# Patient Record
Sex: Male | Born: 1940 | Race: White | Hispanic: No | Marital: Married | Smoking: Former smoker
Health system: Southern US, Community
[De-identification: ages and names within clinical notes are randomized; demographics above are authoritative.]

## PROBLEM LIST (undated history)

## (undated) DIAGNOSIS — Z9889 Other specified postprocedural states: Secondary | ICD-10-CM

## (undated) DIAGNOSIS — T4145XA Adverse effect of unspecified anesthetic, initial encounter: Secondary | ICD-10-CM

## (undated) DIAGNOSIS — K219 Gastro-esophageal reflux disease without esophagitis: Secondary | ICD-10-CM

## (undated) DIAGNOSIS — I10 Essential (primary) hypertension: Secondary | ICD-10-CM

## (undated) DIAGNOSIS — E785 Hyperlipidemia, unspecified: Secondary | ICD-10-CM

## (undated) DIAGNOSIS — C801 Malignant (primary) neoplasm, unspecified: Secondary | ICD-10-CM

## (undated) DIAGNOSIS — IMO0001 Reserved for inherently not codable concepts without codable children: Secondary | ICD-10-CM

## (undated) DIAGNOSIS — R112 Nausea with vomiting, unspecified: Secondary | ICD-10-CM

## (undated) DIAGNOSIS — Z8719 Personal history of other diseases of the digestive system: Secondary | ICD-10-CM

## (undated) DIAGNOSIS — I739 Peripheral vascular disease, unspecified: Secondary | ICD-10-CM

## (undated) DIAGNOSIS — N289 Disorder of kidney and ureter, unspecified: Secondary | ICD-10-CM

## (undated) DIAGNOSIS — J449 Chronic obstructive pulmonary disease, unspecified: Secondary | ICD-10-CM

## (undated) DIAGNOSIS — F17200 Nicotine dependence, unspecified, uncomplicated: Secondary | ICD-10-CM

## (undated) DIAGNOSIS — M1990 Unspecified osteoarthritis, unspecified site: Secondary | ICD-10-CM

## (undated) DIAGNOSIS — K5730 Diverticulosis of large intestine without perforation or abscess without bleeding: Secondary | ICD-10-CM

## (undated) HISTORY — DX: Hyperlipidemia, unspecified: E78.5

## (undated) HISTORY — DX: Diverticulosis of large intestine without perforation or abscess without bleeding: K57.30

## (undated) HISTORY — DX: Reserved for inherently not codable concepts without codable children: IMO0001

## (undated) HISTORY — DX: Essential (primary) hypertension: I10

## (undated) HISTORY — DX: Unspecified osteoarthritis, unspecified site: M19.90

## (undated) HISTORY — PX: WISDOM TOOTH EXTRACTION: SHX21

## (undated) HISTORY — DX: Nicotine dependence, unspecified, uncomplicated: F17.200

## (undated) HISTORY — PX: COLONOSCOPY: SHX174

## (undated) HISTORY — DX: Personal history of other diseases of the digestive system: Z87.19

## (undated) HISTORY — DX: Disorder of kidney and ureter, unspecified: N28.9

## (undated) SURGERY — Surgical Case
Anesthesia: *Unknown

---
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
## 1961-10-31 DIAGNOSIS — T8859XA Other complications of anesthesia, initial encounter: Secondary | ICD-10-CM

## 1961-10-31 HISTORY — DX: Other complications of anesthesia, initial encounter: T88.59XA

## 1989-10-31 HISTORY — PX: CYSTOSCOPY: SUR368

## 2003-04-01 ENCOUNTER — Encounter: Payer: Self-pay | Admitting: Family Medicine

## 2004-04-30 ENCOUNTER — Encounter: Payer: Self-pay | Admitting: Family Medicine

## 2004-11-03 ENCOUNTER — Ambulatory Visit: Payer: Self-pay | Admitting: Family Medicine

## 2005-04-29 ENCOUNTER — Ambulatory Visit: Payer: Self-pay | Admitting: Family Medicine

## 2005-04-30 ENCOUNTER — Encounter: Payer: Self-pay | Admitting: Family Medicine

## 2005-05-05 ENCOUNTER — Ambulatory Visit: Payer: Self-pay | Admitting: Family Medicine

## 2005-11-08 ENCOUNTER — Ambulatory Visit: Payer: Self-pay | Admitting: Family Medicine

## 2006-01-30 ENCOUNTER — Ambulatory Visit: Payer: Self-pay | Admitting: Gastroenterology

## 2006-04-30 ENCOUNTER — Encounter: Payer: Self-pay | Admitting: Family Medicine

## 2006-05-05 ENCOUNTER — Ambulatory Visit: Payer: Self-pay | Admitting: Family Medicine

## 2006-05-09 ENCOUNTER — Ambulatory Visit: Payer: Self-pay | Admitting: Family Medicine

## 2006-11-10 ENCOUNTER — Ambulatory Visit: Payer: Self-pay | Admitting: Family Medicine

## 2007-05-10 ENCOUNTER — Encounter: Payer: Self-pay | Admitting: Family Medicine

## 2007-05-10 DIAGNOSIS — M1990 Unspecified osteoarthritis, unspecified site: Secondary | ICD-10-CM | POA: Insufficient documentation

## 2007-05-10 DIAGNOSIS — E785 Hyperlipidemia, unspecified: Secondary | ICD-10-CM

## 2007-05-10 DIAGNOSIS — K5730 Diverticulosis of large intestine without perforation or abscess without bleeding: Secondary | ICD-10-CM | POA: Insufficient documentation

## 2007-05-10 DIAGNOSIS — D518 Other vitamin B12 deficiency anemias: Secondary | ICD-10-CM | POA: Insufficient documentation

## 2007-05-11 ENCOUNTER — Ambulatory Visit: Payer: Self-pay | Admitting: Family Medicine

## 2007-05-11 DIAGNOSIS — F17200 Nicotine dependence, unspecified, uncomplicated: Secondary | ICD-10-CM

## 2007-05-11 DIAGNOSIS — I10 Essential (primary) hypertension: Secondary | ICD-10-CM | POA: Insufficient documentation

## 2007-05-11 DIAGNOSIS — N259 Disorder resulting from impaired renal tubular function, unspecified: Secondary | ICD-10-CM | POA: Insufficient documentation

## 2007-05-11 LAB — CONVERTED CEMR LAB
Albumin: 4.1 g/dL (ref 3.5–5.2)
Bilirubin, Direct: 0.1 mg/dL (ref 0.0–0.3)
Cholesterol: 190 mg/dL (ref 0–200)
GFR calc non Af Amer: 71 mL/min
Glucose, Bld: 93 mg/dL (ref 70–99)
HDL: 56.5 mg/dL (ref >39.0)
LDL Cholesterol: 116 mg/dL — ABNORMAL HIGH (ref 0–99)
PSA: 1.3 ng/mL (ref 0.10–4.00)
Potassium: 3.9 meq/L (ref 3.5–5.1)
Sodium: 139 meq/L (ref 135–145)
TSH: 3.65 microintl units/mL (ref 0.35–5.50)
Total Bilirubin: 0.9 mg/dL (ref 0.3–1.2)
Triglycerides: 90 mg/dL (ref 0–149)
VLDL: 18 mg/dL (ref 0–40)

## 2007-05-15 ENCOUNTER — Ambulatory Visit: Payer: Self-pay | Admitting: Family Medicine

## 2007-05-25 ENCOUNTER — Ambulatory Visit: Payer: Self-pay | Admitting: Family Medicine

## 2007-06-01 ENCOUNTER — Telehealth (INDEPENDENT_AMBULATORY_CARE_PROVIDER_SITE_OTHER): Payer: Self-pay | Admitting: *Deleted

## 2007-06-22 ENCOUNTER — Ambulatory Visit: Payer: Self-pay | Admitting: Family Medicine

## 2008-02-16 ENCOUNTER — Emergency Department: Payer: Self-pay | Admitting: Internal Medicine

## 2008-02-29 ENCOUNTER — Ambulatory Visit: Payer: Self-pay | Admitting: Family Medicine

## 2008-04-10 ENCOUNTER — Ambulatory Visit: Payer: Self-pay | Admitting: Family Medicine

## 2008-07-10 ENCOUNTER — Encounter (INDEPENDENT_AMBULATORY_CARE_PROVIDER_SITE_OTHER): Payer: Self-pay | Admitting: *Deleted

## 2008-07-31 ENCOUNTER — Ambulatory Visit: Payer: Self-pay | Admitting: Family Medicine

## 2008-07-31 DIAGNOSIS — N009 Acute nephritic syndrome with unspecified morphologic changes: Secondary | ICD-10-CM | POA: Insufficient documentation

## 2008-07-31 LAB — CONVERTED CEMR LAB
Albumin: 4 g/dL (ref 3.5–5.2)
Alkaline Phosphatase: 85 units/L (ref 39–117)
BUN: 17 mg/dL (ref 6–23)
Basophils Relative: 0.7 % (ref 0.0–3.0)
Calcium: 8.7 mg/dL (ref 8.4–10.5)
Creatinine, Ser: 1.2 mg/dL (ref 0.4–1.5)
Eosinophils Absolute: 0.2 10*3/uL (ref 0.0–0.7)
Eosinophils Relative: 3.6 % (ref 0.0–5.0)
GFR calc Af Amer: 78 mL/min
GFR calc non Af Amer: 64 mL/min
Glucose, Bld: 89 mg/dL (ref 70–99)
HCT: 46.9 % (ref 39.0–52.0)
HDL: 52 mg/dL (ref >39.0)
Hemoglobin: 16.7 g/dL (ref 13.0–17.0)
MCV: 103.6 fL — ABNORMAL HIGH (ref 78.0–100.0)
Monocytes Absolute: 0.6 10*3/uL (ref 0.1–1.0)
Monocytes Relative: 10 % (ref 3.0–12.0)
Neutro Abs: 3.2 10*3/uL (ref 1.4–7.7)
PSA: 0.88 ng/mL (ref 0.10–4.00)
Platelets: 195 10*3/uL (ref 150–400)
Potassium: 4 meq/L (ref 3.5–5.1)
Total CHOL/HDL Ratio: 3.1
Total Protein: 7.2 g/dL (ref 6.0–8.3)
Triglycerides: 81 mg/dL (ref 0–149)
WBC: 5.5 10*3/uL (ref 4.5–10.5)

## 2008-08-07 ENCOUNTER — Ambulatory Visit: Payer: Self-pay | Admitting: Family Medicine

## 2008-08-27 ENCOUNTER — Encounter (INDEPENDENT_AMBULATORY_CARE_PROVIDER_SITE_OTHER): Payer: Self-pay | Admitting: *Deleted

## 2008-08-27 ENCOUNTER — Ambulatory Visit: Payer: Self-pay | Admitting: Family Medicine

## 2008-08-27 LAB — CONVERTED CEMR LAB
OCCULT 1: NEGATIVE
OCCULT 2: NEGATIVE
OCCULT 3: NEGATIVE

## 2009-05-28 ENCOUNTER — Ambulatory Visit: Payer: Self-pay | Admitting: Family Medicine

## 2009-05-28 DIAGNOSIS — M25519 Pain in unspecified shoulder: Secondary | ICD-10-CM | POA: Insufficient documentation

## 2009-05-28 DIAGNOSIS — M19049 Primary osteoarthritis, unspecified hand: Secondary | ICD-10-CM | POA: Insufficient documentation

## 2009-06-19 ENCOUNTER — Encounter: Payer: Self-pay | Admitting: Family Medicine

## 2009-07-08 ENCOUNTER — Ambulatory Visit: Payer: Self-pay | Admitting: Family Medicine

## 2009-10-28 ENCOUNTER — Encounter: Payer: Self-pay | Admitting: Family Medicine

## 2010-02-11 ENCOUNTER — Ambulatory Visit: Payer: Self-pay | Admitting: Family Medicine

## 2010-02-12 LAB — CONVERTED CEMR LAB
ALT: 28 units/L (ref 0–53)
AST: 18 units/L (ref 0–37)
Basophils Relative: 0.7 % (ref 0.0–3.0)
Bilirubin, Direct: 0.1 mg/dL (ref 0.0–0.3)
CO2: 29 meq/L (ref 19–32)
Calcium: 9.1 mg/dL (ref 8.4–10.5)
Creatinine, Ser: 1.1 mg/dL (ref 0.4–1.5)
Eosinophils Relative: 4.2 % (ref 0.0–5.0)
GFR calc non Af Amer: 70.76 mL/min (ref >60)
HCT: 47.9 % (ref 39.0–52.0)
HDL: 57.7 mg/dL (ref >39.00)
Lymphs Abs: 1.8 10*3/uL (ref 0.7–4.0)
Monocytes Relative: 11.1 % (ref 3.0–12.0)
Neutrophils Relative %: 55.1 % (ref 43.0–77.0)
Platelets: 202 10*3/uL (ref 150.0–400.0)
Potassium: 3.8 meq/L (ref 3.5–5.1)
RBC: 4.64 M/uL (ref 4.22–5.81)
Sodium: 141 meq/L (ref 135–145)
Total Bilirubin: 0.5 mg/dL (ref 0.3–1.2)
Total CHOL/HDL Ratio: 3
Total Protein: 7.8 g/dL (ref 6.0–8.3)
WBC: 6.2 10*3/uL (ref 4.5–10.5)

## 2010-02-17 ENCOUNTER — Ambulatory Visit: Payer: Self-pay | Admitting: Family Medicine

## 2010-06-07 ENCOUNTER — Encounter (INDEPENDENT_AMBULATORY_CARE_PROVIDER_SITE_OTHER): Payer: Self-pay | Admitting: *Deleted

## 2010-07-23 ENCOUNTER — Ambulatory Visit: Payer: Self-pay | Admitting: Family Medicine

## 2010-07-23 DIAGNOSIS — R05 Cough: Secondary | ICD-10-CM

## 2010-08-18 ENCOUNTER — Encounter: Payer: Self-pay | Admitting: Family Medicine

## 2010-08-25 ENCOUNTER — Ambulatory Visit: Payer: Self-pay | Admitting: Family Medicine

## 2010-11-30 NOTE — Assessment & Plan Note (Signed)
Summary: CHECK UP/CLE   Vital Signs:  Patient profile:   70 year old male Weight:      178.50 pounds Temp:     98.6 degrees F oral Pulse rate:   64 / minute Pulse rhythm:   regular BP sitting:   122 / 68  (left arm) Cuff size:   regular  Vitals Entered By: Sydell Axon LPN (February 17, 2010 1:53 PM) CC: 30 Minute checkup, had a colonoscopy at [facility] in 2008/03/13   History of Present Illness: Pt here for followup. He feels well and is doiung great. He has had an extremely healthy year. He hauled 3 tons of dirt and 3 tons of sand for an above ground pool which is noit up right now.   He has changed ETOH intake and eating habits. He has now arranged his diet for big breakfast, medium lunch and small dinner.   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 1 Tablet Daily By Mouth 2)  Quinapril Hcl 40 Mg  Tabs  (Quinapril Hcl) .Marland Kitchen.. 1 By Mouth Once Daily 3)  Amlodipine Besylate 10 Mg  Tabs (Amlodipine Besylate) .... One Tab By Mouth Hs  Allergies: 1)  ! Penicillin V Potassium (Penicillin V Potassium) 2)  ! Cefzil 3)  ! 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        Cystoscopy due to prolonged prostatitis 01/18/03    Colonoscopy, polyps, diverticular bleed     Recheck 06 3/20 - 01/29/03    [facility], diverticular bleed 01/30/06      Colonoscopy - polyps, biopsy (-) ([place])  3 yrs  Family History: Last updated: May 11, 2007 Father: Died 85, 11 months 03-13-1973) Subdural hematoma Mother: Died 19 Mar 14, 1983) Chronic asthma, MI, thrombophlebitis, H.H., asthma attack caused MI Siblings: None CV:  (+) Mother MI HBP:  (+) self Stroke:  (-) Right facial paralysis S/P Electric shock  Social History: Last updated: May 11, 2007 Current Smoker, off and on for 40 years - 2 PPD at first, now 1 PPD Alcohol use-yes, with dinner Drug use-no Marital Status:  Widower 09/04/23, wife suddenly died.  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  Now Rhetta Mura part-time, Theatre manager, works a lot, Chief Executive Officer  Risk Factors: Alcohol Use: 2 (02/17/2010) Caffeine Use: 5+ (02/17/2010) Exercise: yes (02/17/2010)  Risk Factors: Smoking Status: current (02/17/2010) Packs/Day: 1 (02/17/2010) Cans of tobacco/wk: no (02/17/2010) Passive Smoke Exposure: no (02/17/2010)  Review of Systems General:  Denies chills, fatigue, fever, sweats, weakness, and weight loss. Eyes:  Denies blurring, discharge, eye pain, and  itching. ENT:  Denies decreased hearing, ear discharge, and earache. CV:  Denies chest pain or discomfort, fainting, fatigue, palpitations, shortness of breath with exertion, swelling of feet, and swelling of hands. Resp:  Denies cough, shortness of breath, and wheezing. GI:  Denies abdominal pain, bloody stools, change in bowel habits, constipation, dark tarry stools, diarrhea, indigestion, loss of appetite, nausea, vomiting, vomiting blood, and yellowish skin color. GU:  Denies discharge, dysuria, nocturia, and urinary frequency. MS:  Complains of joint pain and stiffness; denies low back pain, muscle aches, and cramps; left shoulder pain, mild stiffness with arthritis.. Derm:  Denies dryness, itching, and rash. Neuro:  Denies numbness, poor balance, tingling, and tremors.  Physical Exam  General:  Well-developed,well-nourished,in no acute distress; alert,appropriate and cooperative throughout examination Head:  Normocephalic and atraumatic without obvious abnormalities. Sinuses NT. Eyes:  Conjunctiva clear bilaterally.  Ears:  External ear exam shows no significant lesions or deformities.  Otoscopic examination reveals clear canals, tympanic membranes are intact bilaterally without bulging, retraction, inflammation or discharge. Hearing is grossly normal bilaterally. Nose:  External nasal examination shows no deformity or inflammation. Nasal mucosa are pink and moist without lesions or exudates. Mouth:  Oral mucosa and oropharynx without lesions or exudates.  Teeth in good repair. Neck:  No deformities, masses, or tenderness noted. Chest Wall:  No deformities, masses, tenderness or gynecomastia noted. Breasts:  No masses or gynecomastia noted Lungs:  Normal respiratory effort, chest expands symmetrically. Lungs are clear to auscultation, no crackles or wheezes. Heart:  Normal rate and regular rhythm. S1 and S2 normal without gallop, murmur, click, rub or other extra sounds. Abdomen:  Bowel  sounds positive,abdomen soft and non-tender without masses, organomegaly or hernias noted. Rectal:  No external abnormalities noted. Normal sphincter tone. No rectal masses or tenderness. G neg. Genitalia:  Testes bilaterally descended without nodularity, tenderness or masses. No scrotal masses or lesions. No penis lesions or urethral discharge. Prostate:  Prostate gland firm and smooth, no enlargement, nodularity, tenderness, mass, asymmetry or induration. 20gms. Msk:  No deformity or scoliosis noted of thoracic or lumbar spine.  Hands slightly decreased ROM. Left Shoulder mildly decreased also. Pulses:  R and L carotid,radial,femoral,dorsalis pedis and posterior tibial pulses are full and equal bilaterally Extremities:  No clubbing, cyanosis, edema, or deformity noted with normal full range of motion of all joints except fingers and left shoulder..   Neurologic:  No cranial nerve deficits noted. Station and gait are normal. Plantar reflexes are down-going bilaterally. DTRs are symmetrical throughout. Sensory, motor and coordinative functions appear intact. Skin:  Intact without suspicious lesions or rashes except sun damage on arms and head. Cervical Nodes:  No lymphadenopathy noted Inguinal Nodes:  No significant adenopathy Psych:  Cognition and judgment appear intact. Alert and cooperative with normal attention span and concentration. No apparent delusions, illusions, hallucinations   Impression & Recommendations:  Problem # 1:  ARTHRITIS, CARPOMETACARPAL JOINT, BILATERAL (ICD-716.94) Assessment Improved Still a slight bother.  Problem # 2:  SHOULDER PAIN, LEFT (  ZOX-096.04) Assessment: Improved Still a slight bother.  Problem # 3:  SCREENING FOR MALIGNANT NEOPLASM, PROSTATE (ICD-V76.44) Assessment: Unchanged Stable exam and PSA.  Problem # 4:  ANEMIA, VITAMIN B12 DEFICIENCY (ICD-281.1) Assessment: Improved  Stable.  Hgb: 16.9 (02/11/2010)   Hct: 47.9 (02/11/2010)   Platelets:  202.0 (02/11/2010) RBC: 4.64 (02/11/2010)   RDW: 13.3 (02/11/2010)   WBC: 6.2 (02/11/2010) MCV: 103.3 (02/11/2010)   MCHC: 35.3 (02/11/2010) Iron: 138 (02/11/2010)   B12: 382 (02/11/2010)   TSH: 6.19 (02/11/2010)  Problem # 5:  RENAL INSUFFICIENCY (ICD-588.9) Assessment: Improved Normal.  Problem # 6:  HYPERTENSION (ICD-401.9) Assessment: Unchanged Well controlled. His updated medication list for this problem includes:    Bisoprolol-hydrochlorothiazide 5-6.25 Mg Tabs (Bisoprolol-hydrochlorothiazide) .Marland Kitchen... 1 tablet daily by mouth    Quinapril Hcl 40 Mg Tabs (Quinapril hcl) .Marland Kitchen... 1 by mouth once daily    Amlodipine Besylate 10 Mg Tabs (Amlodipine besylate) ..... One tab by mouth at bedtime  BP today: 122/68 Prior BP: 120/70 (07/08/2009)  Labs Reviewed: K+: 3.8 (02/11/2010) Creat: : 1.1 (02/11/2010)   Chol: 167 (02/11/2010)   HDL: 57.70 (02/11/2010)   LDL: 89 (02/11/2010)   TG: 103.0 (02/11/2010)  Problem # 7:  HYPERLIPIDEMIA (ICD-272.4) Assessment: Improved Well controlled. Labs Reviewed: SGOT: 18 (02/11/2010)   SGPT: 28 (02/11/2010)   HDL:57.70 (02/11/2010), 52.0 (07/31/2008)  LDL:89 (02/11/2010), 94 (07/31/2008)  Chol:167 (02/11/2010), 162 (07/31/2008)  Trig:103.0 (02/11/2010), 81 (07/31/2008)  Problem # 8:  DIVERTICULOSIS, COLON (ICD-562.10) Assessment: Unchanged  Discussed being seen for prolonged LLQ discomfort.  Labs Reviewed: Hgb: 16.9 (02/11/2010)   Hct: 47.9 (02/11/2010)   WBC: 6.2 (02/11/2010)  Complete Medication List: 1)  Bisoprolol-hydrochlorothiazide 5-6.25 Mg Tabs (Bisoprolol-hydrochlorothiazide) .Marland Kitchen.. 1 tablet daily by mouth 2)  Quinapril Hcl 40 Mg Tabs (Quinapril hcl) .Marland Kitchen.. 1 by mouth once daily 3)  Amlodipine Besylate 10 Mg Tabs (Amlodipine besylate) .... One tab by mouth at bedtime  Patient Instructions: 1)  RTC as needed. Prescriptions: AMLODIPINE BESYLATE 10 MG  TABS (AMLODIPINE BESYLATE) one tab by mouth at bedtime  #90 x 3   Entered and Authorized  by:   Shaune Leeks MD   Signed by:   Shaune Leeks MD on 02/17/2010   Method used:   [facility]        [id number]       [address]       [place], [place]  [address]       Ph: [phone]       Fax: [phone]   RxID:   [id number] QUINAPRIL HCL 40 MG  TABS (QUINAPRIL HCL) 1 by mouth once daily  #90 Tablet x 3   Entered and Authorized by:   Shaune Leeks MD   Signed by:   Shaune Leeks MD on 02/17/2010   Method used:   Electronically to        [id number]       [address]       [place], [place]  [address]       Ph: [phone]       Fax: [phone]   RxID:   [id number] BISOPROLOL-HYDROCHLOROTHIAZIDE 5-6.25 MG TABS (BISOPROLOL-HYDROCHLOROTHIAZIDE) 1 tablet daily by mouth  #90 Tablet x 3   Entered and Authorized by:   Shaune Leeks MD   Signed by:   Shaune Leeks MD on 02/17/2010   Method used:   [facility]  [address] [id number]       [address]       [place], [place]  [address]       Ph: [phone]       Fax: [phone]   RxID:   [id number]   Current Allergies (reviewed today): ! PENICILLIN V POTASSIUM (PENICILLIN V POTASSIUM) ! CEFZIL ! KLOR-CON 10 (POTASSIUM CHLORIDE)  Appended Document: CHECK UP/CLE   Tetanus/Td Vaccine    Vaccine Type: Td    Site: left deltoid    Mfr: Sanofi Pasteur    Dose: 0.5 ml    Route: IM    Given by: Sydell Axon LPN    Exp. Date: 09/15/2011    Lot #: [id number]    VIS given: 09/18/07 version given February 17, 2010.  Pneumovax Vaccine    Vaccine Type: Pneumovax (Medicare)    Site: right deltoid    Mfr: Merck    Dose: 0.5 ml    Route: IM    Given by: Sydell Axon LPN    Exp. Date: 08/25/2011    Lot #: [id number]    VIS given: 05/28/96 version given February 17, 2010.

## 2010-11-30 NOTE — Assessment & Plan Note (Signed)
Summary: COUGH/CLE   Vital Signs:  Patient profile:   70 year old male Height:      65 inches Weight:      172.75 pounds BMI:     28.85 Temp:     98.6 degrees F oral Pulse rate:   82 / minute Pulse rhythm:   regular BP sitting:   122 / 78  (left arm) Cuff size:   regular  Vitals Entered By: Selena Batten Dance CMA Duncan Dull) (July 23, 2010 2:28 PM) CC: Cough x6 weeks   History of Present Illness: CC: cough  6wks ago had tickle in throat, became cough, lasted 12 days and got better. Now again with tickle, having cough for 14d, then resolved.  Now again with tickle/cough.  Doesn't want to keep going through cycle.  + mild SOB in middle of cycle.  + nausea/vomiting when took robitussin, not otherwise.  Trying ricola as well.  Cough productive of thick viscous yellow phlegm.  Has seasonal allergies.  Not on anything.  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  No abd pain, diarrhea.  No HA, sinus pressure/congestion.  Pt smokes 10 cig/day, 53 PY hx.  Last CXR last year, always got at [facility].  No weight changes, night sweats.  On Quinapril for blood pressure (17yrs).  Current Medications (verified): 1)  Bisoprolol-Hydrochlorothiazide 5-6.25 Mg Tabs (Bisoprolol-Hydrochlorothiazide) .Marland Kitchen.. 1 Tablet Daily By Mouth 2)  Quinapril Hcl 40 Mg  Tabs (Quinapril Hcl) .Marland Kitchen.. 1 By Mouth Once Daily 3)  Amlodipine Besylate 10 Mg  Tabs (Amlodipine Besylate) .... One Tab By Mouth At Bedtime  Allergies: 1)  ! Penicillin V Potassium (Penicillin V Potassium) 2)  ! Cefzil 3)  ! Klor-Con 10 (Potassium Chloride)  Past History:  Past Medical History: Last updated: 05/10/2007 Diverticulosis, colon Hyperlipidemia Hypertension Osteoarthritis Renal insufficiency  Social History: Last updated: 05/10/2007 Current Smoker, off and on for 40 years - 2 PPD at first, now 1 PPD Alcohol use-yes, with dinner Drug use-no Marital Status:  Widower September 08, 2023, wife suddenly died.  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  Now Ship broker part-time, Theatre manager, works a lot, Chief Executive Officer  Review of Systems       per HPI  Physical Exam  General:  Well-developed,well-nourished,in no acute distress; alert,appropriate and cooperative throughout examination Head:  Normocephalic and atraumatic without obvious abnormalities. Sinuses NT. Eyes:  Conjunctiva clear bilaterally.  Ears:  External ear exam shows no significant lesions or deformities.  Otoscopic examination reveals clear canals, tympanic membranes are intact bilaterally without bulging, retraction, inflammation or discharge. Hearing is grossly normal bilaterally. Nose:  External nasal examination shows no deformity or inflammation. Nasal mucosa are pink and moist without lesions or exudates. Mouth:  Oral mucosa and oropharynx without lesions or exudates.  Teeth in good repair. Neck:  No deformities, masses, or tenderness noted. Lungs:  coarse breath sounds, crackles throughout Heart:  Normal rate and regular rhythm. S1 and S2 normal without gallop, murmur, click, rub or other extra sounds. Pulses:  2+ rad pulses Extremities:  no edema Skin:  Intact without suspicious lesions or rashes except sun damage on arms and head.   Impression & Recommendations:  Problem # 1:  COUGH (ICD-786.2) large differential.  Likely combination of bronchitis (from some COPD in this longtime smoker) or possibly allergic given recent exacerbation during fall as well as h/o seasonal allergies.  Alternatively could be ACEI - induced.  Treat  for now as bronchitis or allergic with zpack and then claritin if not resolved.  CXR given long time smoker and poor lung exam = nothing acute, no masses noted, ?early infiltrate retrocardiac.  Orders: T-2 View CXR (71020TC)  Problem # 2:  DISORDER, TOBACCO USE (ICD-305.1) Encouraged smoking cessation, pt hesitant to  quit/cut back.  precontemplative.  Orders: T-2 View CXR (71020TC) and discussed different methods for smoking cessation.   Complete Medication List: 1)  Bisoprolol-hydrochlorothiazide 5-6.25 Mg Tabs (Bisoprolol-hydrochlorothiazide) .Marland Kitchen.. 1 tablet daily by mouth 2)  Quinapril Hcl 40 Mg Tabs (Quinapril hcl) .Marland Kitchen.. 1 by mouth once daily 3)  Amlodipine Besylate 10 Mg Tabs (Amlodipine besylate) .... One tab by mouth at bedtime 4)  Zithromax Z-pak 250 Mg Tabs (Azithromycin) .... Take by mouth as directed 5)  Claritin 10 Mg Tabs (Loratadine) .... One daily for allergies  Patient Instructions: 1)  return in 1 month for follow up. 2)  treat as possible bronchitis with zpack. 3)  treat as allergies with claritin once daily. 4)  CXR today. 5)  Good to see you today, call clinic with questions. Prescriptions: CLARITIN 10 MG TABS (LORATADINE) one daily for allergies  #30 x 1   Entered and Authorized by:   Eustaquio Boyden  MD   Signed by:   Eustaquio Boyden  MD on 07/23/2010   Method used:   [facility]        [id number]       [address]       [place], [place]  [address]       Ph: [phone]       Fax: [phone]   RxID:   [id number] ZITHROMAX Z-PAK 250 MG TABS (AZITHROMYCIN) take by mouth as directed  #1 x 0   Entered and Authorized by:   Eustaquio Boyden  MD   Signed by:   Eustaquio Boyden  MD on 07/23/2010   Method used:   Electronically to        [id number]       [address]       [place], [place]  [address]       Ph: [phone]       Fax: [phone]   RxID:   [id number]   Current Allergies (reviewed today): ! PENICILLIN V POTASSIUM (PENICILLIN V POTASSIUM) ! CEFZIL ! KLOR-CON 10 (POTASSIUM CHLORIDE)

## 2010-11-30 NOTE — Miscellaneous (Signed)
Summary: Flu vaccine  Clinical Lists Changes  Observations: Added new observation of FLU VAX: Historical (06/30/2010 16:54)      Influenza Immunization History:    Influenza # 1:  Historical (06/30/2010) [facility] [place], [place].

## 2010-11-30 NOTE — Letter (Signed)
Summary: Edwin Moran letter  Edwin Moran at [facility]  [address] [place], [place] [address]   Phone: [phone]  Fax: [phone]       06/07/2010 MRN: [id number]  Edwin Moran [address] [place], [place]  [address]  Dear Mr. [facility] [place] and [place] announce the retirement of Arta Silence, M.D., from full-time practice at the [facility] office effective April 29, 2010 and his plans of returning part-time.  It is important to Dr. Hetty Ely and to our practice that you understand that [facility] has seven physicians in our office for your health care needs.  We will continue to offer the same exceptional care that you have today.    Dr. Hetty Ely has spoken to many of you about his plans for retirement and returning part-time in the fall.   We will continue to work with you through the transition to schedule appointments for you in the office and meet the high standards that [facility] is committed to.   Again, it is with great pleasure that we share the news that Dr. Hetty Ely will return to [facility] in October of 2011 with a reduced schedule.    If you have any questions, or would like to request an appointment with one of our physicians, please call us at [phone] and press the option for Scheduling an appointment.  We take pleasure in providing you with excellent patient care and look forward to seeing you at your next office visit.  Our [facility] Physicians are:  Tillman Abide, M.D. Laurita Quint, M.D. Roxy Manns, M.D. Kerby Nora, M.D. Hannah Beat, M.D. Ruthe Mannan, M.D. We proudly welcomed Raechel Ache, M.D. and Eustaquio Boyden, M.D. to the practice in July/August 2011.  Sincerely,  [facility]

## 2010-11-30 NOTE — Assessment & Plan Note (Signed)
Summary: ONE MONTH FOLLOW UP / LFW   Vital Signs:  Patient profile:   70 year old male Weight:      176 pounds Temp:     98.3 degrees F oral Pulse rate:   80 / minute Pulse rhythm:   regular BP sitting:   124 / 72  (left arm) Cuff size:   regular  Vitals Entered By: Selena Batten Dance CMA Duncan Dull) (August 25, 2010 8:01 AM) CC: 1 month follow up   History of Present Illness: CC: cough f/u  seen last month with bronchitis in smoker, treated with azithromycin.  Cough better but still present.  Still slightly productive of yellow phlegm, but feels coming more from sinuses than from lungs.  No fevers/chills.  Declines cough suppressant.  Thinks azithro did help but doesn't note change with claritin.  + PNDrip.  smoking up to 15 cig/day because got rid of cough.  Not interested in cutting back.  Allergies: 1)  ! Penicillin V Potassium (Penicillin V Potassium) 2)  ! Cefzil 3)  ! Klor-Con 10 (Potassium Chloride)  Past History:  Past Medical History: Last updated: 05/10/2007 Diverticulosis, colon Hyperlipidemia Hypertension Osteoarthritis Renal insufficiency  Social History: Last updated: 05/10/2007 Current Smoker, off and on for 40 years - 2 PPD at first, now 1 PPD Alcohol use-yes, with dinner Drug use-no Marital Status:  Widower 27-Sep-2023, wife suddenly died.  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  Now Ship broker part-time, Theatre manager, works a lot, Chief Executive Officer  Review of Systems       per HPI  Physical Exam  General:  Well-developed,well-nourished,in no acute distress; alert,appropriate and cooperative throughout examination Head:  Normocephalic and atraumatic without obvious abnormalities. Sinuses NT. Mouth:  Oral mucosa and oropharynx without lesions or exudates.  Teeth in good repair. Neck:  No deformities, masses, or tenderness noted.  no LAD,  no bruits Lungs:  coarse breath sounds, crackles throughout Heart:  Normal rate and regular rhythm. S1 and S2 normal without gallop, murmur, click, rub or other extra sounds. Pulses:  2+ rad pulses Skin:  Intact without suspicious lesions or rashes except sun damage on arms and head.   Impression & Recommendations:  Problem # 1:  COUGH (ICD-786.2) improved cough, still present.  along with PNdrip component.  ? chronic bronchitis in longtime smoker.  pt hesitant to start meds, declined nasal steroid.  minimalist.  claritin not helping so removed from list.  not interested in further treatment.  doubt ACEI induced as not dry.  Problem # 2:  DISORDER, TOBACCO USE (ICD-305.1) precontemplative  Complete Medication List: 1)  Bisoprolol-hydrochlorothiazide 5-6.25 Mg Tabs (Bisoprolol-hydrochlorothiazide) .Marland Kitchen.. 1 tablet daily by mouth 2)  Quinapril Hcl 40 Mg Tabs (Quinapril hcl) .Marland Kitchen.. 1 by mouth once daily 3)  Amlodipine Besylate 10 Mg Tabs (Amlodipine besylate) .... One tab by mouth at bedtime  Patient Instructions: 1)  I'm glad you're feeling  better. 2)  Come back if drainage becomes a bother or if coughing seems to be worsening.   Orders Added: 1)  Est. Patient Level III [56387]    Current Allergies (reviewed today): ! PENICILLIN V POTASSIUM (PENICILLIN V POTASSIUM) ! CEFZIL ! KLOR-CON 10 (POTASSIUM CHLORIDE)

## 2011-02-19 ENCOUNTER — Other Ambulatory Visit: Payer: Self-pay | Admitting: Family Medicine

## 2011-02-23 ENCOUNTER — Telehealth: Payer: Self-pay | Admitting: *Deleted

## 2011-02-23 NOTE — Telephone Encounter (Signed)
Pt is coming in on 5/17 for f/u, renew meds appt.  He is asking if he needs labs prior.

## 2011-02-24 NOTE — Telephone Encounter (Signed)
Pls ask him to bear with me. I'm at [facility] having a great time hiking with my wife and daughter who came  up from [place]. Will get back to him on my return.

## 2011-03-02 NOTE — Telephone Encounter (Signed)
Patient notified as instructed by telephone. Appointments scheduled as instructed. Lab appointment scheduled for 03/10/11.

## 2011-03-02 NOTE — Telephone Encounter (Signed)
Would do Comp Exam labs if he can be blocked for 30 mins to get PE done. Let me know and will order.

## 2011-03-02 NOTE — Telephone Encounter (Signed)
Noted  

## 2011-03-08 ENCOUNTER — Other Ambulatory Visit: Payer: Self-pay | Admitting: Family Medicine

## 2011-03-08 ENCOUNTER — Encounter: Payer: Self-pay | Admitting: Family Medicine

## 2011-03-08 DIAGNOSIS — I10 Essential (primary) hypertension: Secondary | ICD-10-CM

## 2011-03-08 DIAGNOSIS — D518 Other vitamin B12 deficiency anemias: Secondary | ICD-10-CM

## 2011-03-08 DIAGNOSIS — Z125 Encounter for screening for malignant neoplasm of prostate: Secondary | ICD-10-CM

## 2011-03-08 DIAGNOSIS — M1990 Unspecified osteoarthritis, unspecified site: Secondary | ICD-10-CM

## 2011-03-08 DIAGNOSIS — E785 Hyperlipidemia, unspecified: Secondary | ICD-10-CM

## 2011-03-08 DIAGNOSIS — N259 Disorder resulting from impaired renal tubular function, unspecified: Secondary | ICD-10-CM

## 2011-03-08 DIAGNOSIS — K5730 Diverticulosis of large intestine without perforation or abscess without bleeding: Secondary | ICD-10-CM

## 2011-03-10 ENCOUNTER — Other Ambulatory Visit (INDEPENDENT_AMBULATORY_CARE_PROVIDER_SITE_OTHER): Payer: BLUE CROSS/BLUE SHIELD | Admitting: Family Medicine

## 2011-03-10 DIAGNOSIS — D518 Other vitamin B12 deficiency anemias: Secondary | ICD-10-CM

## 2011-03-10 DIAGNOSIS — Z125 Encounter for screening for malignant neoplasm of prostate: Secondary | ICD-10-CM

## 2011-03-10 DIAGNOSIS — I10 Essential (primary) hypertension: Secondary | ICD-10-CM

## 2011-03-10 DIAGNOSIS — M1990 Unspecified osteoarthritis, unspecified site: Secondary | ICD-10-CM

## 2011-03-10 DIAGNOSIS — K5730 Diverticulosis of large intestine without perforation or abscess without bleeding: Secondary | ICD-10-CM

## 2011-03-10 DIAGNOSIS — E785 Hyperlipidemia, unspecified: Secondary | ICD-10-CM

## 2011-03-10 DIAGNOSIS — N259 Disorder resulting from impaired renal tubular function, unspecified: Secondary | ICD-10-CM

## 2011-03-10 LAB — CBC WITH DIFFERENTIAL/PLATELET
Basophils Relative: 0.5 % (ref 0.0–3.0)
Eosinophils Relative: 3.1 % (ref 0.0–5.0)
Hemoglobin: 16.8 g/dL (ref 13.0–17.0)
Lymphocytes Relative: 22 % (ref 12.0–46.0)
Monocytes Relative: 10.2 % (ref 3.0–12.0)
Neutro Abs: 4.2 10*3/uL (ref 1.4–7.7)
Neutrophils Relative %: 64.2 % (ref 43.0–77.0)
RBC: 4.54 Mil/uL (ref 4.22–5.81)
WBC: 6.5 10*3/uL (ref 4.5–10.5)

## 2011-03-10 LAB — IRON: Iron: 124 ug/dL (ref 42–165)

## 2011-03-10 LAB — RENAL FUNCTION PANEL
Albumin: 4.1 g/dL (ref 3.5–5.2)
Chloride: 105 mEq/L (ref 96–112)
GFR: 77.84 mL/min (ref >60.00)
Phosphorus: 3 mg/dL (ref 2.3–4.6)
Potassium: 3.8 mEq/L (ref 3.5–5.1)
Sodium: 141 mEq/L (ref 135–145)

## 2011-03-10 LAB — PSA: PSA: 1.34 ng/mL (ref 0.10–4.00)

## 2011-03-10 LAB — LIPID PANEL
HDL: 56 mg/dL (ref >39.00)
LDL Cholesterol: 95 mg/dL (ref 0–99)
Total CHOL/HDL Ratio: 3

## 2011-03-10 LAB — HEPATIC FUNCTION PANEL
AST: 14 U/L (ref 0–37)
Total Bilirubin: 0.8 mg/dL (ref 0.3–1.2)

## 2011-03-10 LAB — TSH: TSH: 5.52 u[IU]/mL — ABNORMAL HIGH (ref 0.35–5.50)

## 2011-03-10 LAB — VITAMIN B12: Vitamin B-12: 313 pg/mL (ref 211–911)

## 2011-03-11 LAB — VITAMIN D 25 HYDROXY (VIT D DEFICIENCY, FRACTURES): Vit D, 25-Hydroxy: 34 ng/mL (ref 30–89)

## 2011-03-17 ENCOUNTER — Ambulatory Visit: Payer: Self-pay | Admitting: Family Medicine

## 2011-03-17 ENCOUNTER — Encounter: Payer: Self-pay | Admitting: Family Medicine

## 2011-03-17 ENCOUNTER — Ambulatory Visit (INDEPENDENT_AMBULATORY_CARE_PROVIDER_SITE_OTHER): Payer: Medicare Other | Admitting: Family Medicine

## 2011-03-17 DIAGNOSIS — K5730 Diverticulosis of large intestine without perforation or abscess without bleeding: Secondary | ICD-10-CM

## 2011-03-17 DIAGNOSIS — I10 Essential (primary) hypertension: Secondary | ICD-10-CM

## 2011-03-17 DIAGNOSIS — N259 Disorder resulting from impaired renal tubular function, unspecified: Secondary | ICD-10-CM

## 2011-03-17 DIAGNOSIS — Z0000 Encounter for general adult medical examination without abnormal findings: Secondary | ICD-10-CM

## 2011-03-17 DIAGNOSIS — D518 Other vitamin B12 deficiency anemias: Secondary | ICD-10-CM

## 2011-03-17 DIAGNOSIS — M1990 Unspecified osteoarthritis, unspecified site: Secondary | ICD-10-CM

## 2011-03-17 DIAGNOSIS — N009 Acute nephritic syndrome with unspecified morphologic changes: Secondary | ICD-10-CM

## 2011-03-17 DIAGNOSIS — M25519 Pain in unspecified shoulder: Secondary | ICD-10-CM

## 2011-03-17 DIAGNOSIS — E785 Hyperlipidemia, unspecified: Secondary | ICD-10-CM

## 2011-03-17 NOTE — Assessment & Plan Note (Signed)
Currently nrmalized.

## 2011-03-17 NOTE — Assessment & Plan Note (Signed)
Good control, cont curr meds. BP Readings from Last 3 Encounters:  03/17/11 138/72  08/25/10 124/72  07/23/10 122/78

## 2011-03-17 NOTE — Patient Instructions (Addendum)
Refer to Dr Patsy Lager for right shoulder pain and limitation and right thumb pain. RTC one year, sooner as  Needed.

## 2011-03-17 NOTE — Assessment & Plan Note (Signed)
Appears at least exacerbated by overuse at the job with thumb problem also. Will have Dr Patsy Lager see him for eval.

## 2011-03-17 NOTE — Assessment & Plan Note (Signed)
Will have Dr Patsy Lager see him.

## 2011-03-17 NOTE — Assessment & Plan Note (Addendum)
Well controlled, cont curr therapy. Lab Results  Component Value Date   WBC 6.5 03/10/2011   HGB 16.8 03/10/2011   HCT 47.0 03/10/2011   MCV 103.5* 03/10/2011   PLT 217.0 03/10/2011

## 2011-03-17 NOTE — Assessment & Plan Note (Signed)
RTC for prolonged LLQ discomfort. 

## 2011-03-17 NOTE — Assessment & Plan Note (Signed)
Doing well. Nos good. Cont curr lifestyle. Lab Results  Component Value Date   CHOL 165 03/10/2011   CHOL 167 02/11/2010   CHOL 162 07/31/2008   Lab Results  Component Value Date   HDL 56.00 03/10/2011   HDL 84.16 02/11/2010   HDL 60.6 07/31/2008   Lab Results  Component Value Date   LDLCALC 95 03/10/2011   LDLCALC 89 02/11/2010   LDLCALC 94 07/31/2008   Lab Results  Component Value Date   TRIG 71.0 03/10/2011   TRIG 103.0 02/11/2010   TRIG 81 07/31/2008   Lab Results  Component Value Date   CHOLHDL 3 03/10/2011   CHOLHDL 3 02/11/2010   CHOLHDL 3.1 CALC 07/31/2008   No results found for this basename: LDLDIRECT

## 2011-03-17 NOTE — Assessment & Plan Note (Signed)
Appears well controlled with currently nml nos.

## 2011-03-17 NOTE — Progress Notes (Signed)
Subjective:    Patient ID: Edwin Moran, male    DOB: [birth year], 70 y.o.   MRN: [id number]  HPI Pt here for Comp Exam. 1. His right shoulder hurts to lift past horiz. He has seen Dr Patsy Lager for left shoulder problems.  2. Some kind of crud growing in the scalp for the last 2-3 mos. He has tried all kinds of shampoos. 3. He hurts in the right MCP of thumb. 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 4. Has hammer toe. He tapes it nextdoor and straightens it. Has no problems otherwise.    Review of Systems  Constitutional: Negative for fever, chills, diaphoresis, appetite change, fatigue and unexpected weight change.  HENT: Negative for hearing loss, ear pain, tinnitus and ear discharge.   Eyes: Negative for pain, discharge and visual disturbance.  Respiratory: Negative for cough, shortness of breath and wheezing.   Cardiovascular: Negative for chest pain and palpitations.       No SOB w/ exertion  Gastrointestinal: Negative for nausea, vomiting, abdominal pain, diarrhea, constipation and blood in stool.       No heartburn or swallowing problems.  Genitourinary: Negative for dysuria, frequency and difficulty urinating.       No nocturia  Musculoskeletal: Negative for myalgias, back pain and arthralgias.       See HPI.  Skin: Negative for rash.       No itching or dryness. Scaling and pimples of the scalp.  Neurological: Negative for tremors and numbness.       No tingling or balance problems.  Hematological: Negative for adenopathy. Does not bruise/bleed easily.  Psychiatric/Behavioral: Negative for dysphoric mood and agitation.       Objective:   Physical Exam  Constitutional: He is oriented to person, place, and time. He appears well-developed and well-nourished. No distress.  HENT:  Head: Normocephalic and atraumatic.  Right Ear: External ear normal.  Left Ear: External ear normal.  Nose: Nose normal.  Mouth/Throat: Oropharynx is clear and moist.    Eyes: Conjunctivae and EOM are normal. Pupils are equal, round, and reactive to light. Right eye exhibits no discharge. Left eye exhibits no discharge. No scleral icterus.  Neck: Normal range of motion. Neck supple. No thyromegaly present.  Cardiovascular: Normal rate, regular rhythm, normal heart sounds and intact distal pulses.   No murmur heard. Pulmonary/Chest: Effort normal and breath sounds normal. No respiratory distress. He has no wheezes.  Abdominal: Soft. Bowel sounds are normal. He exhibits no distension and no mass. There is no tenderness. There is no rebound and no guarding.  Genitourinary: Rectum normal, prostate normal and penis normal. Guaiac negative stool.       Prostate 10-20gms.  Musculoskeletal: Normal range of motion. He exhibits no edema.  Lymphadenopathy:    He has no cervical adenopathy.  Neurological: He is alert and oriented to person, place, and time. Coordination normal.  Skin: Skin is warm and dry. No rash noted. He is not diaphoretic.  Psychiatric: He has a normal mood and affect. His behavior is normal. Judgment and thought content normal.          Assessment & Plan:  HMPE    I have personally reviewed the Medicare Annual Wellness questionnaire and have noted 1. The patient's medical and social history 2. Their use of alcohol, tobacco or illicit drugs 3. Their current medications and supplements 4. The patient's functional ability including ADL's, fall risks, home safety risks and hearing or visual  impairment. 5. Diet and physical activities 6. Evidence for depression or mood disorders

## 2011-03-25 ENCOUNTER — Other Ambulatory Visit: Payer: Self-pay | Admitting: Family Medicine

## 2011-03-25 MED ORDER — QUINAPRIL HCL 40 MG PO TABS: 40.0000 mg | ORAL_TABLET | Freq: Every day | ORAL | Status: DC

## 2011-03-25 MED ORDER — BISOPROLOL-HYDROCHLOROTHIAZIDE 5-6.25 MG PO TABS: 1.0000 | ORAL_TABLET | Freq: Every day | ORAL | Status: DC

## 2011-03-30 ENCOUNTER — Ambulatory Visit (INDEPENDENT_AMBULATORY_CARE_PROVIDER_SITE_OTHER): Payer: Medicare Other | Admitting: Family Medicine

## 2011-03-30 ENCOUNTER — Encounter: Payer: Self-pay | Admitting: Family Medicine

## 2011-03-30 ENCOUNTER — Ambulatory Visit (INDEPENDENT_AMBULATORY_CARE_PROVIDER_SITE_OTHER)
Admission: RE | Admit: 2011-03-30 | Discharge: 2011-03-30 | Disposition: A | Discharge status: Home / Self Care | Payer: Medicare Other | Source: Ambulatory Visit | Attending: Family Medicine | Admitting: Family Medicine

## 2011-03-30 VITALS — BP 136/74 | HR 64 | Temp 97.3°F | Ht 65.0 in | Wt 173.5 lb

## 2011-03-30 DIAGNOSIS — M25519 Pain in unspecified shoulder: Secondary | ICD-10-CM

## 2011-03-30 DIAGNOSIS — M25511 Pain in right shoulder: Secondary | ICD-10-CM

## 2011-03-30 MED ORDER — MELOXICAM 15 MG PO TABS: 15.0000 mg | ORAL_TABLET | Freq: Every day | ORAL | Status: DC

## 2011-03-30 NOTE — Progress Notes (Signed)
Subjective:    Patient ID: Edwin Moran, male    DOB: May 14, 1941, 70 y.o.   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 Diverticulosis of colon   . Hyperlipidemia   . Hypertension   . Osteoarthritis   . Renal insufficiency   . History of GI diverticular bleed 03/20-03/31/04    [facility]    History   Social History  . Marital Status: Married    Spouse Name: N/A    Number of Children: N/A  . Years of Education: N/A   Occupational History  .  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 Smoking status: Current Everyday Smoker -- 1.0 packs/day for 40 years  . Smokeless tobacco: Never Used   Comment: off and on for 40 years, 2 PPD at first  . Alcohol Use: 7.0 oz/week    14 drink(s) per week     couple of drinks a day  . Drug Use: No  . Sexually Active: No   Other Topics Concern  . Not on file   Social History Narrative   Widower 09/08/2023, wife suddenly died, remarriedOne daughter by previous marriage, one step-son     Review of Systems Review of Systems  Constitutional: Negative for fever, appetite change, fatigue  and unexpected weight change.  Eyes: Negative for pain and visual disturbance.  Respiratory: Negative for cough and shortness of breath.   Cardiovascular: Negative for cp or palpitations.   Gastrointestinal: Negative for nausea, diarrhea and constipation.  Genitourinary: Negative for urgency and frequency.  Skin: Negative for pallor.  MSK pos for shoulder and upper back pain and soreness  Neurological: Negative for weakness, light-headedness, numbness and headaches.  Hematological: Negative for adenopathy. Does not bruise/bleed easily.  Psychiatric/Behavioral: Negative for dysphoric mood. The patient is not nervous/anxious.           Objective:   Physical Exam  Constitutional: He appears well-developed and well-nourished. No distress.  HENT:  Head: Normocephalic and atraumatic.  Mouth/Throat: Oropharynx is clear and moist.  Eyes: Conjunctivae and EOM are normal. Pupils are equal, round, and reactive to light.  Neck: Normal range of motion. Neck supple. No JVD present. No thyromegaly present.  Cardiovascular: Normal rate and regular rhythm.   Pulmonary/Chest: Effort normal and breath sounds normal. No respiratory distress. He has no wheezes.  Musculoskeletal: He exhibits tenderness. He exhibits no edema.       R shoulder  Tender over ac joint  Mildly pos  hawkings and pos neer tests  Fair int/ ext rotation No apprehension on passive extension Nl passive rom Limited active rom -- cannot abduct past 90 degrees  Nl strength and grip  No swelling/ warmth or crepitice  Lymphadenopathy:    He has no cervical adenopathy.  Neurological: He is alert. He has normal strength. No sensory deficit. Coordination normal.  Skin: Skin is warm and dry. No rash noted. No erythema. No pallor.  Psychiatric: He has a normal mood and affect.          Assessment & Plan:

## 2011-03-30 NOTE — Patient Instructions (Signed)
Take mobic instead of aleve 1 pill daily with food-- stop it if stomach upset Xray today Then I will update you with a plan

## 2011-03-30 NOTE — Assessment & Plan Note (Signed)
Some features of impingement  X ray - ? If bone spur or OA Passive rom exercises Change to mobic and update  Consider sport med consult if not imp

## 2011-03-31 ENCOUNTER — Telehealth: Payer: Self-pay | Admitting: Family Medicine

## 2011-03-31 NOTE — Telephone Encounter (Signed)
Dr Milinda Antis, I am not sure what this is about. There is no reason for call and no med to be called in. I am not sure why this showed up. I guess just disregard at this time. Sorry I cannot help. Rena

## 2011-03-31 NOTE — Telephone Encounter (Signed)
Edwin Moran what is this and what do I do with it?

## 2011-12-22 ENCOUNTER — Encounter: Payer: Self-pay | Admitting: Family Medicine

## 2011-12-22 ENCOUNTER — Ambulatory Visit (INDEPENDENT_AMBULATORY_CARE_PROVIDER_SITE_OTHER): Payer: Medicare Other | Admitting: Family Medicine

## 2011-12-22 VITALS — BP 104/62 | HR 76 | Temp 98.1°F | Wt 175.0 lb

## 2011-12-22 DIAGNOSIS — J329 Chronic sinusitis, unspecified: Secondary | ICD-10-CM

## 2011-12-22 MED ORDER — BENZONATATE 200 MG PO CAPS: 200.0000 mg | ORAL_CAPSULE | Freq: Three times a day (TID) | ORAL | Status: AC | PRN

## 2011-12-22 MED ORDER — AZITHROMYCIN 250 MG PO TABS: ORAL_TABLET | ORAL | Status: AC

## 2011-12-22 NOTE — Patient Instructions (Signed)
Drink plenty of fluids, take tylenol as needed, and gargle with warm salt water for your throat.  Take tessalon for the cough.  Use nasal saline.  Start the antibiotics today.  This should gradually improve.  Take care.  Let us know if you have other concerns.

## 2011-12-22 NOTE — Progress Notes (Signed)
duration of symptoms: 6 weeks total, started with postnasal gtt that led to cough.  No fevers.  Present for 1-2 weeks and then a few days of relief, then the cycle repeats.  rhinorrhea:yes congestion:no ear pain: no sore throat:no cough:yes myalgias:no other concerns:grandkids are sick No fevers >100.4 but occ chills.   "I don't feel bad, I just have the cough and drip." Tan sputum from throat clearing.   ROS: See HPI.  Otherwise negative.    Meds, vitals, and allergies reviewed.   GEN: nad, alert and oriented HEENT: mucous membranes moist, TM w/o erythema, nasal epithelium injected, OP with cobblestoning, Sinuses not ttp x4 NECK: supple w/o LA CV: rrr. PULM: +UAN, no wheeze, no focal dec in BS ABD: soft, +bs EXT: no edema

## 2011-12-25 DIAGNOSIS — J329 Chronic sinusitis, unspecified: Secondary | ICD-10-CM | POA: Insufficient documentation

## 2011-12-25 NOTE — Assessment & Plan Note (Signed)
Abx, supportive care, f/u prn.  Nontoxic.

## 2012-03-17 ENCOUNTER — Other Ambulatory Visit: Payer: Self-pay | Admitting: Family Medicine

## 2012-03-21 ENCOUNTER — Ambulatory Visit (INDEPENDENT_AMBULATORY_CARE_PROVIDER_SITE_OTHER)
Admission: RE | Admit: 2012-03-21 | Discharge: 2012-03-21 | Disposition: A | Discharge status: Home / Self Care | Payer: Medicare Other | Source: Ambulatory Visit | Attending: Family Medicine | Admitting: Family Medicine

## 2012-03-21 ENCOUNTER — Encounter: Payer: Self-pay | Admitting: Family Medicine

## 2012-03-21 ENCOUNTER — Ambulatory Visit (INDEPENDENT_AMBULATORY_CARE_PROVIDER_SITE_OTHER): Payer: Medicare Other | Admitting: Family Medicine

## 2012-03-21 ENCOUNTER — Ambulatory Visit: Payer: Medicare Other | Admitting: Family Medicine

## 2012-03-21 VITALS — BP 124/80 | HR 71 | Temp 97.9°F | Ht 65.0 in | Wt 174.8 lb

## 2012-03-21 DIAGNOSIS — M222X9 Patellofemoral disorders, unspecified knee: Secondary | ICD-10-CM

## 2012-03-21 DIAGNOSIS — M25569 Pain in unspecified knee: Secondary | ICD-10-CM

## 2012-03-21 NOTE — Progress Notes (Signed)
  Patient Name: Edwin Moran Date of Birth: Aug 30, 1941 Age: 71 y.o. Medical Record Number: [id number] Gender: male Date of Encounter: 03/21/2012  History of Present Illness:  Edwin Moran is a 71 y.o. very pleasant male patient who presents with the following:  Both knee, L > R:  Did karate for about ten years. Does not recall ever hurting them. Started about three months ago and right knee started to pop, then hte left started.  Some anterior mild knee pain. Cracking and popping. No giving way. No locking up of the joint. No mechanical symptoms. The patient is able to walk every day without difficulty. He is mainly concerned about the anterior cracking  Past Medical History, Surgical History, Social History, Family History, Problem List, Medications, and Allergies have been reviewed and updated if relevant.  Review of Systems:  GEN: No fevers, chills. Nontoxic. Primarily MSK c/o today. MSK: Detailed in the HPI GI: tolerating PO intake without difficulty Neuro: No numbness, parasthesias, or tingling associated. Otherwise the pertinent positives of the ROS are noted above.    Physical Examination: Filed Vitals:   03/21/12 1154  BP: 124/80  Pulse: 71  Temp: 97.9 F (36.6 C)  TempSrc: Oral  Height: 5\' 5"  (1.651 m)  Weight: 174 lb 12.8 oz (79.289 kg)  SpO2: 96%    Body mass index is 29.09 kg/(m^2).   GEN: WDWN, NAD, Non-toxic, Alert & Oriented x 3 HEENT: Atraumatic, Normocephalic.  Ears and Nose: No external deformity. EXTR: No clubbing/cyanosis/edema NEURO: Normal gait.  PSYCH: Normally interactive. Conversant. Not depressed or anxious appearing.  Calm demeanor.   Knee:  b Gait: Normal heel toe pattern ROM: 0-130 b Effusion: neg Echymosis or edema: none Patellar tendon NT Painful PLICA: neg Patellar grind: pos with crepitus B Medial and lateral patellar facet loading: negative medial and lateral joint lines:NT Mcmurray's neg Flexion-pinch neg Varus and valgus  stress: stable Lachman: neg Ant and Post drawer: neg Hip abduction, IR, ER: WNL Hip flexion str: 5/5 Hip abd: 5/5 Quad: 5/5 VMO atrophy:No Hamstring concentric and eccentric: 5/5   Assessment and Plan: 1. Knee pain  DG Knee Bilateral Standing AP, DG Knee 1-2 Views Left  2. Patellofemoral pain syndrome      Essentially normal knee exam with some mild patellofemoral crepitus. I reassured the patient. 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 Continue with THIGH STR and fitness  Dg Knee 1-2 Views Left  03/21/2012  *RADIOLOGY REPORT*  Clinical Data: Continued left knee pain  LEFT KNEE - 1-2 VIEW  Comparison: PA bilateral knee radiographs 03/21/2012  Findings: Minimal joint space narrowing. No fracture, dislocation or bone destruction. No significant spur formation or knee joint effusion. Scattered atherosclerotic calcifications of the superficial femoral and popliteal arteries.  IMPRESSION: Minimal degenerative changes left knee.  Original Report Authenticated By: Lollie Marrow, M.D.   Dg Knee Bilateral Standing Ap  03/21/2012  *RADIOLOGY REPORT*  Clinical Data: Ongoing left knee pain  BILATERAL KNEES STANDING - 1 VIEW  Comparison: None  Findings: Left knee one-view AP: Scattered atherosclerotic calcifications. Bones appear mildly demineralized. Minimal joint space narrowing. No fracture, dislocation or bone destruction.  Right knee one-view: Scattered atherosclerotic calcification. Bones appear demineralized. Joint spaces appear narrowed. No acute fracture, dislocation or bone destruction.  IMPRESSION: Minimal degenerative changes of bilateral knees. Osseous demineralization.  Original Report Authenticated By: Lollie Marrow, M.D.

## 2013-03-08 ENCOUNTER — Other Ambulatory Visit: Payer: Self-pay | Admitting: Family Medicine

## 2013-03-08 NOTE — Telephone Encounter (Signed)
Electronic refill request.   Patient is past due for CPE, not seen at all in almost a year.  Please advise.

## 2013-03-10 NOTE — Telephone Encounter (Signed)
Sent, needs CPE.

## 2013-03-11 ENCOUNTER — Other Ambulatory Visit: Payer: Self-pay | Admitting: Family Medicine

## 2013-03-11 DIAGNOSIS — D62 Acute posthemorrhagic anemia: Secondary | ICD-10-CM

## 2013-03-11 DIAGNOSIS — E538 Deficiency of other specified B group vitamins: Secondary | ICD-10-CM

## 2013-03-11 DIAGNOSIS — I10 Essential (primary) hypertension: Secondary | ICD-10-CM

## 2013-03-11 NOTE — Telephone Encounter (Signed)
Left message on patient's voicemail to return call.   Letter mailed.  

## 2013-03-13 ENCOUNTER — Other Ambulatory Visit (INDEPENDENT_AMBULATORY_CARE_PROVIDER_SITE_OTHER): Payer: Medicare Other

## 2013-03-13 DIAGNOSIS — E538 Deficiency of other specified B group vitamins: Secondary | ICD-10-CM

## 2013-03-13 DIAGNOSIS — D518 Other vitamin B12 deficiency anemias: Secondary | ICD-10-CM

## 2013-03-13 DIAGNOSIS — I10 Essential (primary) hypertension: Secondary | ICD-10-CM

## 2013-03-13 DIAGNOSIS — D62 Acute posthemorrhagic anemia: Secondary | ICD-10-CM

## 2013-03-13 DIAGNOSIS — E785 Hyperlipidemia, unspecified: Secondary | ICD-10-CM

## 2013-03-13 LAB — CBC WITH DIFFERENTIAL/PLATELET
Basophils Absolute: 0 10*3/uL (ref 0.0–0.1)
Basophils Relative: 0.8 % (ref 0.0–3.0)
Eosinophils Absolute: 0.3 10*3/uL (ref 0.0–0.7)
Hemoglobin: 15.9 g/dL (ref 13.0–17.0)
Lymphocytes Relative: 30.7 % (ref 12.0–46.0)
MCHC: 35.2 g/dL (ref 30.0–36.0)
MCV: 99.7 fl (ref 78.0–100.0)
Monocytes Absolute: 0.7 10*3/uL (ref 0.1–1.0)
Neutro Abs: 2.9 10*3/uL (ref 1.4–7.7)
RBC: 4.51 Mil/uL (ref 4.22–5.81)
RDW: 13.3 % (ref 11.5–14.6)

## 2013-03-13 LAB — COMPREHENSIVE METABOLIC PANEL
Albumin: 3.9 g/dL (ref 3.5–5.2)
Alkaline Phosphatase: 82 U/L (ref 39–117)
BUN: 19 mg/dL (ref 6–23)
CO2: 26 mEq/L (ref 19–32)
GFR: 69.4 mL/min (ref >60.00)
Glucose, Bld: 96 mg/dL (ref 70–99)
Total Bilirubin: 0.7 mg/dL (ref 0.3–1.2)

## 2013-03-13 LAB — LIPID PANEL
Cholesterol: 150 mg/dL (ref 0–200)
LDL Cholesterol: 79 mg/dL (ref 0–99)
Triglycerides: 65 mg/dL (ref 0.0–149.0)
VLDL: 13 mg/dL (ref 0.0–40.0)

## 2013-03-21 ENCOUNTER — Encounter: Payer: Self-pay | Admitting: Family Medicine

## 2013-03-21 ENCOUNTER — Ambulatory Visit (INDEPENDENT_AMBULATORY_CARE_PROVIDER_SITE_OTHER): Payer: Medicare Other | Admitting: Family Medicine

## 2013-03-21 VITALS — BP 104/54 | HR 64 | Temp 97.6°F | Ht 65.0 in | Wt 169.5 lb

## 2013-03-21 DIAGNOSIS — Z0000 Encounter for general adult medical examination without abnormal findings: Secondary | ICD-10-CM

## 2013-03-21 DIAGNOSIS — D518 Other vitamin B12 deficiency anemias: Secondary | ICD-10-CM

## 2013-03-21 DIAGNOSIS — Z1211 Encounter for screening for malignant neoplasm of colon: Secondary | ICD-10-CM

## 2013-03-21 DIAGNOSIS — I10 Essential (primary) hypertension: Secondary | ICD-10-CM

## 2013-03-21 MED ORDER — QUINAPRIL HCL 40 MG PO TABS: ORAL_TABLET | ORAL | Status: DC

## 2013-03-21 MED ORDER — AMLODIPINE BESYLATE 10 MG PO TABS: ORAL_TABLET | ORAL | Status: DC

## 2013-03-21 MED ORDER — BISOPROLOL-HYDROCHLOROTHIAZIDE 5-6.25 MG PO TABS: ORAL_TABLET | ORAL | Status: DC

## 2013-03-21 NOTE — Patient Instructions (Addendum)
Call about an eye exam.   Check with your insurance to see if they will cover the shingles shot. See Bonita Quin about your referral before you leave today. If you want to go for the aneurysm screening, let us know.

## 2013-03-21 NOTE — Assessment & Plan Note (Signed)
See scanned forms.  Routine anticipatory guidance given to patient.  See health maintenance. Flu 2013 Shingles d/w pt PNA 2011 Tetanus 2011 Colonoscopy- referral placed.   Prostate cancer screening and PSA options (with potential risks and benefits of testing vs not testing) were discussed along with recent recs/guidelines.  He declined testing PSA at this point. Advance directive d/w pt. Would have his daughter designated if incapacitated.   Cognitive function addressed- see scanned forms- and if abnormal then additional documentation follows.

## 2013-03-21 NOTE — Assessment & Plan Note (Signed)
b12 and CBC unremarkable, d/w pt.

## 2013-03-21 NOTE — Progress Notes (Signed)
I have personally reviewed the Medicare Annual Wellness questionnaire and have noted 1. The patient's medical and social history 2. Their use of alcohol, tobacco or illicit drugs 3. Their current medications and supplements 4. The patient's functional ability including ADL's, fall risks, home safety risks and hearing or visual             impairment. 5. Diet and physical activities 6. Evidence for depression or mood disorders  The patients weight, height, BMI have been recorded in the chart and visual acuity is per eye clinic.  I have made referrals, counseling and provided education to the patient based review of the above and I have provided the pt with a written personalized care plan for preventive services.  See scanned forms.  Routine anticipatory guidance given to patient.  See health maintenance. Flu 2013 Shingles d/w pt PNA 2011 Tetanus 2011 Colonoscopy- referral placed.   Prostate cancer screening and PSA options (with potential risks and benefits of testing vs not testing) were discussed along with recent recs/guidelines.  He declined testing PSA at this point. Advance directive d/w pt. Would have his daughter designated if incapacitated.   Cognitive function addressed- see scanned forms- and if abnormal then additional documentation follows.   Hypertension:    Using medication without problems or lightheadedness: yes Chest pain with exertion:no Edema:no Short of breath: only with extremes of exercise, as expected.  Average home BPs:no Labs d/w pt.    PMH and SH reviewed  Meds, vitals, and allergies reviewed.   ROS: See HPI.  Otherwise negative.    GEN: nad, alert and oriented HEENT: mucous membranes moist NECK: supple w/o LA CV: rrr. PULM: ctab, no inc wob ABD: soft, +bs EXT: no edema SKIN: no acute rash

## 2013-03-21 NOTE — Assessment & Plan Note (Signed)
Controlled, continue as is.  Labs d/w pt.  

## 2013-06-12 ENCOUNTER — Ambulatory Visit: Payer: Self-pay | Admitting: Gastroenterology

## 2013-06-18 ENCOUNTER — Encounter: Payer: Self-pay | Admitting: Family Medicine

## 2013-10-23 ENCOUNTER — Encounter: Payer: Self-pay | Admitting: Family Medicine

## 2014-01-01 ENCOUNTER — Telehealth: Payer: Self-pay | Admitting: Family Medicine

## 2014-01-01 NOTE — Telephone Encounter (Signed)
Patient Information:  Caller Name: Indy  Phone: [phone]  Patient: Edwin Moran, Edwin Moran  Gender: Male  DOB: 01/13/1941  Age: 73 Years  PCP: Elsie Stain Brigitte Pulse) [facility])  Office Follow Up:  Does the office need to follow up with this patient?: No  Instructions For The Office: N/A  RN Note:  He is interested on decreasing smoking and evaluation of shortness of breath with exertion.  Patient has scheduled appt time with the office for tomorrow 01/02/14. Advised to keep appt. call back for questions, changes or concerns.  Symptoms  Reason For Call & Symptoms: Patient thinks he may have "COPD".  He states when he climbs stairs he gets out of breath and his daughter told him he was starting to wheeze'.  He reports only with exertion activities.   Onset for 4 months. It does not appear to be getting better. Smokes <1 pack daily.  No fever, +congestion, productive cough clear.  He states it is better when he does not smoke.  Reviewed Health History In EMR: Yes  Reviewed Medications In EMR: Yes  Reviewed Allergies In EMR: Yes  Reviewed Surgeries / Procedures: Yes  Date of Onset of Symptoms: 09/04/2013  Guideline(s) Used:  Breathing Difficulty  Disposition Per Guideline:   See Within 2 Weeks in Office  Reason For Disposition Reached:   Mild longstanding difficulty breathing (e.g., speaks in phrases, SOB even at rest, pulse 100-120) and same as normal  Advice Given:  Call Back If:  Severe difficulty breathing occurs  Fever more than 100.5 F (38.1 C)  You become worse.  Patient Will Follow Care Advice:  YES

## 2014-01-02 ENCOUNTER — Encounter: Payer: Self-pay | Admitting: Family Medicine

## 2014-01-02 ENCOUNTER — Ambulatory Visit (HOSPITAL_COMMUNITY): Admission: RE | Admit: 2014-01-02 | Payer: BLUE CROSS/BLUE SHIELD | Source: Ambulatory Visit

## 2014-01-02 ENCOUNTER — Ambulatory Visit: Admission: RE | Admit: 2014-01-02 | Payer: Medicare Other | Source: Ambulatory Visit

## 2014-01-02 ENCOUNTER — Ambulatory Visit (INDEPENDENT_AMBULATORY_CARE_PROVIDER_SITE_OTHER): Payer: Medicare Other | Admitting: Family Medicine

## 2014-01-02 VITALS — BP 116/66 | HR 66 | Temp 97.7°F | Wt 175.0 lb

## 2014-01-02 DIAGNOSIS — R0602 Shortness of breath: Secondary | ICD-10-CM | POA: Insufficient documentation

## 2014-01-02 MED ORDER — ALBUTEROL SULFATE HFA 108 (90 BASE) MCG/ACT IN AERS: 2.0000 | INHALATION_SPRAY | Freq: Four times a day (QID) | RESPIRATORY_TRACT | Status: DC | PRN

## 2014-01-02 NOTE — Progress Notes (Signed)
Pre visit review using our clinic review tool, if applicable. No additional management support is needed unless otherwise documented below in the visit note.  SOB episodically.  Smoking, wants to quit, considering it.  For the last 2-3 months, he can walk up steps w/o troubles normally but now carrying a laundry basket up the steps he'll get SOB.  No CP.  Some cough and some clear sputum.  Some wheeze.  He has quit tobacco cold [place] prev, interested in using gum. Has cut down.    Meds, vitals, and allergies reviewed.   ROS: See HPI.  Otherwise, noncontributory.  GEN: nad, alert and oriented HEENT: mucous membranes moist NECK: supple w/o LA CV: rrr.  PULM: ctab, no inc wob ABD: soft, +bs EXT: no edema SKIN: no acute rash  EKG w/o acute changes.

## 2014-01-02 NOTE — Assessment & Plan Note (Signed)
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  We didn't do PFTs because even if normal, would have sent him home with SABA.  Routine instructions given.  Xray was down, will return for CXR.  He agrees, I apologized about that.  Doesn't appear to be a cardiac source.  COPD path/phys and dx d/w pt.  Encouraged to quit smoking.

## 2014-01-02 NOTE — Patient Instructions (Signed)
We'll call about the chest xray, when the machine is up and running.  Use the inhaler in the meantime.  Take care.   Update me after you've used the inhaler a few times.

## 2014-01-03 ENCOUNTER — Telehealth: Payer: Self-pay | Admitting: Family Medicine

## 2014-01-03 NOTE — Telephone Encounter (Signed)
Relevant patient education assigned to patient using Emmi. ° °

## 2014-01-07 ENCOUNTER — Ambulatory Visit (INDEPENDENT_AMBULATORY_CARE_PROVIDER_SITE_OTHER)
Admission: RE | Admit: 2014-01-07 | Discharge: 2014-01-07 | Disposition: A | Discharge status: Home / Self Care | Payer: Medicare Other | Source: Ambulatory Visit | Attending: Family Medicine | Admitting: Family Medicine

## 2014-01-07 DIAGNOSIS — R0602 Shortness of breath: Secondary | ICD-10-CM

## 2014-01-20 ENCOUNTER — Other Ambulatory Visit: Payer: Self-pay | Admitting: Family Medicine

## 2014-01-20 MED ORDER — BUDESONIDE-FORMOTEROL FUMARATE 160-4.5 MCG/ACT IN AERO: 1.0000 | INHALATION_SPRAY | Freq: Two times a day (BID) | RESPIRATORY_TRACT | Status: DC

## 2014-05-31 ENCOUNTER — Other Ambulatory Visit: Payer: Self-pay | Admitting: Family Medicine

## 2014-08-30 ENCOUNTER — Other Ambulatory Visit: Payer: Self-pay | Admitting: Family Medicine

## 2014-09-05 ENCOUNTER — Other Ambulatory Visit: Payer: Self-pay | Admitting: Family Medicine

## 2014-09-21 ENCOUNTER — Other Ambulatory Visit: Payer: Self-pay | Admitting: Family Medicine

## 2014-09-21 DIAGNOSIS — I10 Essential (primary) hypertension: Secondary | ICD-10-CM

## 2014-09-21 DIAGNOSIS — D518 Other vitamin B12 deficiency anemias: Secondary | ICD-10-CM

## 2014-09-23 ENCOUNTER — Other Ambulatory Visit (INDEPENDENT_AMBULATORY_CARE_PROVIDER_SITE_OTHER): Payer: Medicare Other

## 2014-09-23 DIAGNOSIS — I10 Essential (primary) hypertension: Secondary | ICD-10-CM

## 2014-09-23 DIAGNOSIS — D518 Other vitamin B12 deficiency anemias: Secondary | ICD-10-CM

## 2014-09-23 LAB — COMPREHENSIVE METABOLIC PANEL
ALT: 21 U/L (ref 0–53)
AST: 11 U/L (ref 0–37)
Albumin: 4.2 g/dL (ref 3.5–5.2)
Alkaline Phosphatase: 81 U/L (ref 39–117)
BILIRUBIN TOTAL: 0.5 mg/dL (ref 0.2–1.2)
BUN: 25 mg/dL — ABNORMAL HIGH (ref 6–23)
CO2: 26 meq/L (ref 19–32)
Calcium: 9.3 mg/dL (ref 8.4–10.5)
Chloride: 101 mEq/L (ref 96–112)
Creatinine, Ser: 1.2 mg/dL (ref 0.4–1.5)
GFR: 60.81 mL/min (ref >60.00)
Glucose, Bld: 96 mg/dL (ref 70–99)
Potassium: 4 mEq/L (ref 3.5–5.1)
Sodium: 138 mEq/L (ref 135–145)
Total Protein: 7.3 g/dL (ref 6.0–8.3)

## 2014-09-23 LAB — LIPID PANEL
CHOLESTEROL: 160 mg/dL (ref 0–200)
HDL: 50.4 mg/dL (ref >39.00)
LDL Cholesterol: 97 mg/dL (ref 0–99)
NonHDL: 109.6
Total CHOL/HDL Ratio: 3
Triglycerides: 65 mg/dL (ref 0.0–149.0)
VLDL: 13 mg/dL (ref 0.0–40.0)

## 2014-09-23 LAB — VITAMIN B12: Vitamin B-12: 686 pg/mL (ref 211–911)

## 2014-10-01 ENCOUNTER — Other Ambulatory Visit: Payer: Self-pay | Admitting: Family Medicine

## 2014-10-03 ENCOUNTER — Encounter: Payer: BLUE CROSS/BLUE SHIELD | Admitting: Family Medicine

## 2014-10-06 ENCOUNTER — Ambulatory Visit (INDEPENDENT_AMBULATORY_CARE_PROVIDER_SITE_OTHER): Payer: Medicare Other | Admitting: Family Medicine

## 2014-10-06 ENCOUNTER — Encounter: Payer: Self-pay | Admitting: Family Medicine

## 2014-10-06 VITALS — BP 124/64 | HR 65 | Temp 97.8°F | Ht 65.5 in | Wt 171.5 lb

## 2014-10-06 DIAGNOSIS — Z720 Tobacco use: Secondary | ICD-10-CM

## 2014-10-06 DIAGNOSIS — F17200 Nicotine dependence, unspecified, uncomplicated: Secondary | ICD-10-CM

## 2014-10-06 DIAGNOSIS — Z0000 Encounter for general adult medical examination without abnormal findings: Secondary | ICD-10-CM

## 2014-10-06 DIAGNOSIS — D518 Other vitamin B12 deficiency anemias: Secondary | ICD-10-CM

## 2014-10-06 DIAGNOSIS — Z7189 Other specified counseling: Secondary | ICD-10-CM

## 2014-10-06 DIAGNOSIS — I10 Essential (primary) hypertension: Secondary | ICD-10-CM

## 2014-10-06 MED ORDER — QUINAPRIL HCL 40 MG PO TABS: 40.0000 mg | ORAL_TABLET | Freq: Every day | ORAL | Status: DC

## 2014-10-06 MED ORDER — ALBUTEROL SULFATE HFA 108 (90 BASE) MCG/ACT IN AERS: 2.0000 | INHALATION_SPRAY | Freq: Four times a day (QID) | RESPIRATORY_TRACT | Status: DC | PRN

## 2014-10-06 MED ORDER — BISOPROLOL-HYDROCHLOROTHIAZIDE 5-6.25 MG PO TABS: 1.0000 | ORAL_TABLET | Freq: Every day | ORAL | Status: DC

## 2014-10-06 MED ORDER — AMLODIPINE BESYLATE 10 MG PO TABS: 10.0000 mg | ORAL_TABLET | Freq: Every day | ORAL | Status: DC

## 2014-10-06 NOTE — Patient Instructions (Signed)
Check with your insurance to see if they will cover the shingles shot. Check on getting a prevnar shot.   Take care. Glad to see you.

## 2014-10-06 NOTE — Progress Notes (Signed)
Pre visit review using our clinic review tool, if applicable. No additional management support is needed unless otherwise documented below in the visit note.  I have personally reviewed the Medicare Annual Wellness questionnaire and have noted 1. The patient's medical and social history 2. Their use of alcohol, tobacco or illicit drugs 3. Their current medications and supplements 4. The patient's functional ability including ADL's, fall risks, home safety risks and hearing or visual             impairment. 5. Diet and physical activities 6. Evidence for depression or mood disorders  The patients weight, height, BMI have been recorded in the chart and visual acuity is per eye clinic.  I have made referrals, counseling and provided education to the patient based review of the above and I have provided the pt with a written personalized care plan for preventive services.  Provider list updated- see scanned forms.  Routine anticipatory guidance given to patient.  See health maintenance.  Flu 2015 Shingles d/w pt PNA 2011 Tetanus 2011 Colonoscopy 2014 Prostate cancer screening and PSA options (with potential risks and benefits of testing vs not testing) were discussed along with recent recs/guidelines.  He declined testing PSA at this point. Advance directive- daughter designated if patient were incapacitated.  Cognitive function addressed- see scanned forms- and if abnormal then additional documentation follows.   Smoker.  Using SABA about 3x/week, couldn't afford other inhalers but is smoking less and doing better overall. Occ SOB, improved with SABA, no ADE on med. Rare wheeze o/w.   Hypertension:    Using medication without problems or lightheadedness: yes Chest pain with exertion:no Edema:no Short of breath:see above  B12 def.  On replacement.  No ADE on meds.  Labs d/w pt.   PMH and SH reviewed  Meds, vitals, and allergies reviewed.   ROS: See HPI.  Otherwise negative.     GEN: nad, alert and oriented HEENT: mucous membranes moist NECK: supple w/o LA CV: rrr. PULM: ctab, no inc wob ABD: soft, +bs EXT: no edema SKIN: no acute rash

## 2014-10-07 DIAGNOSIS — Z7189 Other specified counseling: Secondary | ICD-10-CM | POA: Insufficient documentation

## 2014-10-07 NOTE — Assessment & Plan Note (Signed)
Controlled, continue as is. Labs d/w pt.  Lipids controlled.

## 2014-10-07 NOTE — Assessment & Plan Note (Signed)
Controlled, continue as is. Labs d/w pt.  B12 wnl.

## 2014-10-07 NOTE — Assessment & Plan Note (Signed)
Smoking less, ctab today, still with ~3 SABA doses per week, if needed more then he'll notify me.  Continue tobacco taper in meantime.  He agrees.

## 2014-10-07 NOTE — Assessment & Plan Note (Signed)
Flu 2015 Shingles d/w pt PNA 2011 Tetanus 2011 Colonoscopy 2014 Prostate cancer screening and PSA options (with potential risks and benefits of testing vs not testing) were discussed along with recent recs/guidelines.  He declined testing PSA at this point. Advance directive- daughter designated if patient were incapacitated.  Cognitive function addressed- see scanned forms- and if abnormal then additional documentation follows.

## 2014-11-03 LAB — HM DIABETES EYE EXAM

## 2014-11-27 ENCOUNTER — Encounter: Payer: Self-pay | Admitting: Family Medicine

## 2014-12-02 ENCOUNTER — Encounter: Payer: Self-pay | Admitting: Family Medicine

## 2015-04-24 ENCOUNTER — Encounter: Payer: Self-pay | Admitting: Family Medicine

## 2015-04-24 ENCOUNTER — Ambulatory Visit (INDEPENDENT_AMBULATORY_CARE_PROVIDER_SITE_OTHER): Payer: Medicare Other | Admitting: Family Medicine

## 2015-04-24 VITALS — BP 116/84 | HR 60 | Temp 97.6°F | Wt 172.5 lb

## 2015-04-24 DIAGNOSIS — J209 Acute bronchitis, unspecified: Secondary | ICD-10-CM | POA: Diagnosis not present

## 2015-04-24 MED ORDER — ALBUTEROL SULFATE HFA 108 (90 BASE) MCG/ACT IN AERS: 1.0000 | INHALATION_SPRAY | Freq: Four times a day (QID) | RESPIRATORY_TRACT | Status: DC | PRN

## 2015-04-24 MED ORDER — DOXYCYCLINE HYCLATE 100 MG PO TABS: 100.0000 mg | ORAL_TABLET | Freq: Two times a day (BID) | ORAL | Status: DC

## 2015-04-24 NOTE — Patient Instructions (Signed)
Take mucinex with plenty of fluid.  Start the antibiotics and use the inhaler if needed.   Call the eye clinic about the spot on your right lower eyelid.   Take care. Glad to see you

## 2015-04-24 NOTE — Progress Notes (Signed)
Pre visit review using our clinic review tool, if applicable. No additional management support is needed unless otherwise documented below in the visit note.  He is cutting back on smoking, down to 1/2 ppd now.  Started about 1 week ago, rhinorrhea and stuffy initially, then cough, sputum production in the meantime.  Using SABA (hadn't needed it much prev).  Temp 101 recently.  SOB at night, more cough laying down.  Recently with some diarrhea.  Using SABA helps for a little while, about 1 hour.    Skin lesion on R lower eyelid. Present for the last week.    Meds, vitals, and allergies reviewed.   ROS: See HPI.  Otherwise, noncontributory.  GEN: nad, alert and oriented HEENT: mucous membranes moist NECK: supple w/o LA CV: rrr. PULM: rhonchi in all lung fields with occ wheeze but no inc wob ABD: soft, +bs EXT: no edema SKIN: no acute rash

## 2015-04-25 DIAGNOSIS — J209 Acute bronchitis, unspecified: Secondary | ICD-10-CM | POA: Insufficient documentation

## 2015-04-25 NOTE — Assessment & Plan Note (Signed)
Still okay for outpatient f/u.  Start doxy, use SABA prn, use mucinex, smoking d/w pt.  He agrees.  F/u prn.  Call back as needed.

## 2015-07-06 ENCOUNTER — Other Ambulatory Visit: Payer: Self-pay | Admitting: Family Medicine

## 2015-08-27 ENCOUNTER — Other Ambulatory Visit: Payer: Self-pay | Admitting: Family Medicine

## 2015-08-27 NOTE — Telephone Encounter (Signed)
Sent.  Please schedule CPE in the next few months.  Thanks.

## 2015-08-27 NOTE — Telephone Encounter (Signed)
Quinapril last refilled 07/07/15 for #90 with 0 refills. Last office visit was 04/24/15. Okay to refill this medication?

## 2015-08-28 NOTE — Telephone Encounter (Signed)
Please schedule CPE for patient per Dr. Damita Dunnings. Thanks!

## 2015-08-28 NOTE — Telephone Encounter (Signed)
Left message asking pt to call office  °

## 2015-09-29 ENCOUNTER — Other Ambulatory Visit: Payer: Self-pay | Admitting: Family Medicine

## 2015-11-02 ENCOUNTER — Other Ambulatory Visit: Payer: Self-pay | Admitting: Family Medicine

## 2015-11-02 DIAGNOSIS — D518 Other vitamin B12 deficiency anemias: Secondary | ICD-10-CM

## 2015-11-02 DIAGNOSIS — I10 Essential (primary) hypertension: Secondary | ICD-10-CM

## 2015-11-04 ENCOUNTER — Other Ambulatory Visit (INDEPENDENT_AMBULATORY_CARE_PROVIDER_SITE_OTHER): Payer: Medicare Other

## 2015-11-04 DIAGNOSIS — D518 Other vitamin B12 deficiency anemias: Secondary | ICD-10-CM | POA: Diagnosis not present

## 2015-11-04 DIAGNOSIS — I10 Essential (primary) hypertension: Secondary | ICD-10-CM

## 2015-11-04 LAB — COMPREHENSIVE METABOLIC PANEL
ALBUMIN: 4.1 g/dL (ref 3.5–5.2)
ALK PHOS: 95 U/L (ref 39–117)
ALT: 16 U/L (ref 0–53)
AST: 10 U/L (ref 0–37)
BUN: 26 mg/dL — AB (ref 6–23)
CO2: 27 mEq/L (ref 19–32)
CREATININE: 1.05 mg/dL (ref 0.40–1.50)
Calcium: 9.6 mg/dL (ref 8.4–10.5)
Chloride: 104 mEq/L (ref 96–112)
GFR: 73.45 mL/min (ref >60.00)
Glucose, Bld: 109 mg/dL — ABNORMAL HIGH (ref 70–99)
Potassium: 3.7 mEq/L (ref 3.5–5.1)
SODIUM: 140 meq/L (ref 135–145)
TOTAL PROTEIN: 7.3 g/dL (ref 6.0–8.3)
Total Bilirubin: 0.4 mg/dL (ref 0.2–1.2)

## 2015-11-04 LAB — LIPID PANEL
CHOLESTEROL: 166 mg/dL (ref 0–200)
HDL: 53 mg/dL (ref >39.00)
LDL Cholesterol: 101 mg/dL — ABNORMAL HIGH (ref 0–99)
NonHDL: 113.09
Total CHOL/HDL Ratio: 3
Triglycerides: 62 mg/dL (ref 0.0–149.0)
VLDL: 12.4 mg/dL (ref 0.0–40.0)

## 2015-11-04 LAB — VITAMIN B12: VITAMIN B 12: 392 pg/mL (ref 211–911)

## 2015-11-11 ENCOUNTER — Encounter: Payer: Self-pay | Admitting: Family Medicine

## 2015-11-11 ENCOUNTER — Ambulatory Visit (INDEPENDENT_AMBULATORY_CARE_PROVIDER_SITE_OTHER): Payer: Medicare Other | Admitting: Family Medicine

## 2015-11-11 VITALS — BP 128/70 | HR 60 | Temp 97.6°F | Ht 64.0 in | Wt 172.5 lb

## 2015-11-11 DIAGNOSIS — I10 Essential (primary) hypertension: Secondary | ICD-10-CM | POA: Diagnosis not present

## 2015-11-11 DIAGNOSIS — Z0000 Encounter for general adult medical examination without abnormal findings: Secondary | ICD-10-CM

## 2015-11-11 DIAGNOSIS — Z23 Encounter for immunization: Secondary | ICD-10-CM | POA: Diagnosis not present

## 2015-11-11 DIAGNOSIS — F17200 Nicotine dependence, unspecified, uncomplicated: Secondary | ICD-10-CM

## 2015-11-11 DIAGNOSIS — D518 Other vitamin B12 deficiency anemias: Secondary | ICD-10-CM

## 2015-11-11 MED ORDER — AMLODIPINE BESYLATE 10 MG PO TABS: ORAL_TABLET | ORAL | Status: DC

## 2015-11-11 MED ORDER — BISOPROLOL-HYDROCHLOROTHIAZIDE 5-6.25 MG PO TABS: 1.0000 | ORAL_TABLET | Freq: Every day | ORAL | Status: DC

## 2015-11-11 MED ORDER — ALBUTEROL SULFATE HFA 108 (90 BASE) MCG/ACT IN AERS: 1.0000 | INHALATION_SPRAY | Freq: Four times a day (QID) | RESPIRATORY_TRACT | Status: DC | PRN

## 2015-11-11 MED ORDER — BUDESONIDE-FORMOTEROL FUMARATE 160-4.5 MCG/ACT IN AERO: 2.0000 | INHALATION_SPRAY | Freq: Two times a day (BID) | RESPIRATORY_TRACT | Status: AC

## 2015-11-11 MED ORDER — QUINAPRIL HCL 40 MG PO TABS: ORAL_TABLET | ORAL | Status: DC

## 2015-11-11 NOTE — Progress Notes (Signed)
Pre visit review using our clinic review tool, if applicable. No additional management support is needed unless otherwise documented below in the visit note.  I have personally reviewed the Medicare Annual Wellness questionnaire and have noted 1. The patient's medical and social history 2. Their use of alcohol, tobacco or illicit drugs 3. Their current medications and supplements 4. The patient's functional ability including ADL's, fall risks, home safety risks and hearing or visual             impairment. 5. Diet and physical activities 6. Evidence for depression or mood disorders  The patients weight, height, BMI have been recorded in the chart and visual acuity is per eye clinic.  I have made referrals, counseling and provided education to the patient based review of the above and I have provided the pt with a written personalized care plan for preventive services.  Provider list updated- see scanned forms.  Routine anticipatory guidance given to patient.  See health maintenance.  Flu 2017 Shingles d/w pt PNA 2011, Prevnar 13 done 2017 Tetanus 2011 Colonoscopy 2014 Prostate cancer screening and PSA options (with potential risks and benefits of testing vs not testing) were discussed along with recent recs/guidelines. He declined testing PSA at this point. Advance directive- daughter designated if patient were incapacitated.  Cognitive function addressed- see scanned forms- and if abnormal then additional documentation follows.   Hypertension:    Using medication without problems or lightheadedness: yes Chest pain with exertion:no Edema:no Short of breath: likely from smoking, d/w pt.  Average home BPs: ~120s/70s Labs d/w pt.    Smoking.  Has been needing SABA more often with less effect, needed for exertion.  D/w pt about smoking cessation.  He wants to use the patch.  More cough overall.  Some wheeze with exertion.  More mucous production overall.  No fevers.    PMH and SH  reviewed  Meds, vitals, and allergies reviewed.   ROS: See HPI.  Otherwise negative.    GEN: nad, alert and oriented HEENT: mucous membranes moist NECK: supple w/o LA CV: rrr. PULM: ctab, no inc wob ABD: soft, +bs EXT: no edema SKIN: no acute rash

## 2015-11-11 NOTE — Patient Instructions (Addendum)
Check with your insurance to see if they will cover the shingles shot. Work on stopping smoking, use the patches, and add on the new inhaler.  Rinse after using symbicort.  Use albuterol as needed.  Take care.  Glad to see you.  Please update me in about 2 weeks after starting the new inhaler.

## 2015-11-12 ENCOUNTER — Encounter: Payer: Self-pay | Admitting: Family Medicine

## 2015-11-12 NOTE — Assessment & Plan Note (Signed)
b12 okay, d/w pt.  Continue as is.

## 2015-11-12 NOTE — Assessment & Plan Note (Signed)
Flu 2017  Shingles d/w pt  PNA 2011, Prevnar 13 done 2017  Tetanus 2011  Colonoscopy 2014  Prostate cancer screening and PSA options (with potential risks and benefits of testing vs not testing) were discussed along with recent recs/guidelines. He declined testing PSA at this point.  Advance directive- daughter designated if patient were incapacitated.  Cognitive function addressed- see scanned forms- and if abnormal then additional documentation follows.

## 2015-11-12 NOTE — Assessment & Plan Note (Signed)
Controlled, continue current meds.  D/w pt about diet and exercise given his sugar.  He agrees. Lipids reasonable, d/w pt.

## 2015-11-12 NOTE — Assessment & Plan Note (Signed)
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  D/w pt.  PFTs wouldn't change plan at this point.  He needs to stop smoking, he'll use the patch and update me as needed.  D/w pt.  Add on symbicort with routine cautions and use SABA prn.  He agrees.   He'll update me.   Okay for outpatient f/u.

## 2015-12-03 LAB — HM DIABETES EYE EXAM

## 2015-12-17 ENCOUNTER — Encounter: Payer: Self-pay | Admitting: Family Medicine

## 2016-01-01 ENCOUNTER — Encounter: Payer: Self-pay | Admitting: Family Medicine

## 2016-06-14 ENCOUNTER — Encounter: Payer: Self-pay | Admitting: Family Medicine

## 2016-06-14 ENCOUNTER — Other Ambulatory Visit: Payer: Self-pay | Admitting: Family Medicine

## 2016-06-14 ENCOUNTER — Ambulatory Visit (INDEPENDENT_AMBULATORY_CARE_PROVIDER_SITE_OTHER): Payer: Medicare Other | Admitting: Family Medicine

## 2016-06-14 VITALS — BP 120/60 | HR 61 | Temp 97.5°F | Wt 168.0 lb

## 2016-06-14 DIAGNOSIS — I739 Peripheral vascular disease, unspecified: Secondary | ICD-10-CM

## 2016-06-14 DIAGNOSIS — I10 Essential (primary) hypertension: Secondary | ICD-10-CM

## 2016-06-14 LAB — COMPREHENSIVE METABOLIC PANEL
ALK PHOS: 94 U/L (ref 39–117)
ALT: 16 U/L (ref 0–53)
AST: 12 U/L (ref 0–37)
Albumin: 4.1 g/dL (ref 3.5–5.2)
BILIRUBIN TOTAL: 0.6 mg/dL (ref 0.2–1.2)
BUN: 23 mg/dL (ref 6–23)
CO2: 27 meq/L (ref 19–32)
CREATININE: 0.86 mg/dL (ref 0.40–1.50)
Calcium: 9.5 mg/dL (ref 8.4–10.5)
Chloride: 100 mEq/L (ref 96–112)
GFR: 92.32 mL/min (ref >60.00)
GLUCOSE: 98 mg/dL (ref 70–99)
Potassium: 3.4 mEq/L — ABNORMAL LOW (ref 3.5–5.1)
Sodium: 136 mEq/L (ref 135–145)
TOTAL PROTEIN: 7.3 g/dL (ref 6.0–8.3)

## 2016-06-14 LAB — CBC WITH DIFFERENTIAL/PLATELET
BASOS ABS: 0 10*3/uL (ref 0.0–0.1)
Basophils Relative: 0.6 % (ref 0.0–3.0)
EOS ABS: 0.2 10*3/uL (ref 0.0–0.7)
Eosinophils Relative: 3.9 % (ref 0.0–5.0)
HEMATOCRIT: 43.4 % (ref 39.0–52.0)
Hemoglobin: 15.1 g/dL (ref 13.0–17.0)
LYMPHS ABS: 1.2 10*3/uL (ref 0.7–4.0)
Lymphocytes Relative: 18.1 % (ref 12.0–46.0)
MCHC: 34.8 g/dL (ref 30.0–36.0)
MCV: 96.7 fl (ref 78.0–100.0)
Monocytes Absolute: 0.7 10*3/uL (ref 0.1–1.0)
Monocytes Relative: 11.6 % (ref 3.0–12.0)
NEUTROS PCT: 65.8 % (ref 43.0–77.0)
Neutro Abs: 4.2 10*3/uL (ref 1.4–7.7)
PLATELETS: 232 10*3/uL (ref 150.0–400.0)
RBC: 4.49 Mil/uL (ref 4.22–5.81)
RDW: 13.4 % (ref 11.5–15.5)
WBC: 6.4 10*3/uL (ref 4.0–10.5)

## 2016-06-14 LAB — LIPID PANEL
CHOL/HDL RATIO: 3
Cholesterol: 163 mg/dL (ref 0–200)
HDL: 57.7 mg/dL (ref >39.00)
LDL Cholesterol: 91 mg/dL (ref 0–99)
NONHDL: 105.77
Triglycerides: 76 mg/dL (ref 0.0–149.0)
VLDL: 15.2 mg/dL (ref 0.0–40.0)

## 2016-06-14 NOTE — Progress Notes (Signed)
He is working to stop smoking.  D/w pt.  Encouraged.   R foot pain.  No L foot pain.  He has noted R foot is colder.  First noted sx about 2 weeks ago.    Prev with R calf pain about 2 weeks ago, felt like a cramp.  Some stinging pain in the R foot now, worse when sock or bed sheet is pressing on the foot.  Some improvement with stretching, temporary relief.  Not having rest pain o/w.  No chest pain.    Meds, vitals, and allergies reviewed.   ROS: Per HPI unless specifically indicated in ROS section   nad ncat rrr ctab abd soft Ext w/o edema, R calf not ttp Dec but palpable R DP pulse, clearly fainter than L DP Some purplish changes on the skin on the distal R foot but with cap refill <2 sec.  No ulceration.  Normal radial pulses.

## 2016-06-14 NOTE — Progress Notes (Signed)
Pre visit review using our clinic review tool, if applicable. No additional management support is needed unless otherwise documented below in the visit note. 

## 2016-06-14 NOTE — Progress Notes (Signed)
img 

## 2016-06-14 NOTE — Patient Instructions (Signed)
Go to the lab on the way out.  We'll contact you with your lab report. Rosaria Ferries will call about your referral. Limit exertion for now.  We'll get the ultrasound of your legs and cardiology appointment then we'll go from there.  If the pain is worse in the foot or if you have ulceration, then go to the ER.  Don't smoke.

## 2016-06-14 NOTE — Assessment & Plan Note (Signed)
Check arterial u/s today and refer to cards.  He'll likely need cardiac eval in general given the presumed PAD.  D/w pt.  We may need to refer to CVTS.  At this point, still with enough perfusion for outpatient f/u; no ulceration, no rest pain.  D/w pt with routine ER cautions.  He agrees.   Would check routine labs in the meantime given his HLD/HTN hx.  Advised to stop smoking.   All d/w pt.  >25 minutes spent in face to face time with patient, >50% spent in counselling or coordination of care.

## 2016-06-15 ENCOUNTER — Telehealth: Payer: Self-pay | Admitting: Family Medicine

## 2016-06-15 NOTE — Telephone Encounter (Signed)
Reminder to put in note about call report for pt's ABI from yesterday 06/14/16 from [facility]

## 2016-06-15 NOTE — Telephone Encounter (Signed)
Thanks.  Late entry.  Call from [facility].  Abnormal ABI on R leg, and they'll proceed with angiography in the near future.  No need for arterial doppler.  Order cancelled.  App help of all involved.

## 2016-06-16 ENCOUNTER — Other Ambulatory Visit: Payer: Self-pay | Admitting: Vascular Surgery

## 2016-06-16 NOTE — Telephone Encounter (Signed)
Just FYI to Dr. Damita Dunnings: Got a call from pt this morning. Wanted to let you know he moved cardio appt from Tues 8/22 til 07/06/16 because he is having surgery this Monday 8/21 for blockage in Right leg and didn't think he would feel up to seeing cardio they day after surgery.

## 2016-06-16 NOTE — Telephone Encounter (Signed)
Noted. Thanks.

## 2016-06-17 ENCOUNTER — Encounter: Payer: Self-pay | Admitting: *Deleted

## 2016-06-17 ENCOUNTER — Other Ambulatory Visit
Admission: RE | Admit: 2016-06-17 | Discharge: 2016-06-17 | Disposition: A | Discharge status: Home / Self Care | Payer: Medicare Other | Source: Ambulatory Visit | Attending: Vascular Surgery | Admitting: Vascular Surgery

## 2016-06-17 DIAGNOSIS — Z0000 Encounter for general adult medical examination without abnormal findings: Secondary | ICD-10-CM | POA: Diagnosis present

## 2016-06-17 LAB — CREATININE, SERUM: Creatinine, Ser: 0.97 mg/dL (ref 0.61–1.24)

## 2016-06-17 LAB — BUN: BUN: 24 mg/dL — AB (ref 6–20)

## 2016-06-20 ENCOUNTER — Ambulatory Visit: Payer: Medicare Other

## 2016-06-20 ENCOUNTER — Encounter
Admission: RE | Disposition: A | Discharge status: Home / Self Care | Payer: Self-pay | Source: Ambulatory Visit | Attending: Vascular Surgery

## 2016-06-20 ENCOUNTER — Ambulatory Visit
Admission: RE | Admit: 2016-06-20 | Discharge: 2016-06-20 | Disposition: A | Discharge status: Home / Self Care | Payer: Medicare Other | Source: Ambulatory Visit | Attending: Vascular Surgery | Admitting: Vascular Surgery

## 2016-06-20 DIAGNOSIS — Z8249 Family history of ischemic heart disease and other diseases of the circulatory system: Secondary | ICD-10-CM | POA: Diagnosis not present

## 2016-06-20 DIAGNOSIS — Z888 Allergy status to other drugs, medicaments and biological substances status: Secondary | ICD-10-CM | POA: Insufficient documentation

## 2016-06-20 DIAGNOSIS — M2550 Pain in unspecified joint: Secondary | ICD-10-CM | POA: Insufficient documentation

## 2016-06-20 DIAGNOSIS — Z880 Allergy status to penicillin: Secondary | ICD-10-CM | POA: Diagnosis not present

## 2016-06-20 DIAGNOSIS — F17200 Nicotine dependence, unspecified, uncomplicated: Secondary | ICD-10-CM | POA: Diagnosis not present

## 2016-06-20 DIAGNOSIS — R062 Wheezing: Secondary | ICD-10-CM | POA: Diagnosis not present

## 2016-06-20 DIAGNOSIS — I70221 Atherosclerosis of native arteries of extremities with rest pain, right leg: Secondary | ICD-10-CM | POA: Diagnosis not present

## 2016-06-20 DIAGNOSIS — I10 Essential (primary) hypertension: Secondary | ICD-10-CM | POA: Diagnosis not present

## 2016-06-20 HISTORY — PX: PERIPHERAL VASCULAR CATHETERIZATION: SHX172C

## 2016-06-20 SURGERY — LOWER EXTREMITY ANGIOGRAPHY
Anesthesia: Moderate Sedation | Laterality: Right

## 2016-06-20 MED ORDER — GUAIFENESIN-DM 100-10 MG/5ML PO SYRP: 15.0000 mL | ORAL_SOLUTION | ORAL | Status: DC | PRN

## 2016-06-20 MED ORDER — HYDRALAZINE HCL 20 MG/ML IJ SOLN: 5.0000 mg | INTRAMUSCULAR | Status: DC | PRN

## 2016-06-20 MED ORDER — METHYLPREDNISOLONE SODIUM SUCC 125 MG IJ SOLR: 125.0000 mg | INTRAMUSCULAR | Status: DC | PRN

## 2016-06-20 MED ORDER — FAMOTIDINE 20 MG PO TABS: 40.0000 mg | ORAL_TABLET | ORAL | Status: DC | PRN

## 2016-06-20 MED ORDER — ASPIRIN EC 81 MG PO TBEC
81.0000 mg | DELAYED_RELEASE_TABLET | Freq: Every day | ORAL | 2 refills | Status: DC
Start: 2016-06-20 — End: 2016-11-03

## 2016-06-20 MED ORDER — LABETALOL HCL 5 MG/ML IV SOLN: 10.0000 mg | INTRAVENOUS | Status: DC | PRN

## 2016-06-20 MED ORDER — HYDROMORPHONE HCL 1 MG/ML IJ SOLN: 0.5000 mg | INTRAMUSCULAR | Status: DC | PRN

## 2016-06-20 MED ORDER — CLINDAMYCIN PHOSPHATE 300 MG/50ML IV SOLN: 300.0000 mg | Freq: Once | INTRAVENOUS | Status: DC

## 2016-06-20 MED ORDER — HEPARIN SODIUM (PORCINE) 1000 UNIT/ML IJ SOLN
INTRAMUSCULAR | Status: DC | PRN
  Administered 2016-06-20: 5000 [IU] via INTRAVENOUS

## 2016-06-20 MED ORDER — FENTANYL CITRATE (PF) 100 MCG/2ML IJ SOLN
INTRAMUSCULAR | Status: AC
  Filled 2016-06-20: qty 4

## 2016-06-20 MED ORDER — FENTANYL CITRATE (PF) 100 MCG/2ML IJ SOLN
INTRAMUSCULAR | Status: DC | PRN
  Administered 2016-06-20 (×2): 25 ug via INTRAVENOUS
  Administered 2016-06-20: 50 ug via INTRAVENOUS
  Administered 2016-06-20 (×2): 25 ug via INTRAVENOUS

## 2016-06-20 MED ORDER — PHENOL 1.4 % MT LIQD: 1.0000 | OROMUCOSAL | Status: DC | PRN

## 2016-06-20 MED ORDER — SODIUM CHLORIDE FLUSH 0.9 % IV SOLN
INTRAVENOUS | Status: AC
  Filled 2016-06-20: qty 50

## 2016-06-20 MED ORDER — MIDAZOLAM HCL 2 MG/2ML IJ SOLN
INTRAMUSCULAR | Status: DC | PRN
  Administered 2016-06-20: 2 mg via INTRAVENOUS
  Administered 2016-06-20 (×3): 1 mg via INTRAVENOUS

## 2016-06-20 MED ORDER — ONDANSETRON HCL 4 MG/2ML IJ SOLN
4.0000 mg | Freq: Four times a day (QID) | INTRAMUSCULAR | Status: DC | PRN
Start: 2016-06-20 — End: 2016-06-20

## 2016-06-20 MED ORDER — ATORVASTATIN CALCIUM 20 MG PO TABS
20.0000 mg | ORAL_TABLET | Freq: Every day | ORAL | 5 refills | Status: DC
Start: 2016-06-20 — End: 2016-10-26

## 2016-06-20 MED ORDER — ONDANSETRON HCL 4 MG/2ML IJ SOLN: 4.0000 mg | Freq: Four times a day (QID) | INTRAMUSCULAR | Status: DC | PRN

## 2016-06-20 MED ORDER — HEPARIN (PORCINE) IN NACL 2-0.9 UNIT/ML-% IJ SOLN
INTRAMUSCULAR | Status: AC
  Filled 2016-06-20: qty 1000

## 2016-06-20 MED ORDER — IOPAMIDOL (ISOVUE-300) INJECTION 61%
INTRAVENOUS | Status: DC | PRN
  Administered 2016-06-20: 70 mL via INTRA_ARTERIAL

## 2016-06-20 MED ORDER — SODIUM CHLORIDE 0.9 % IV SOLN: 500.0000 mL | Freq: Once | INTRAVENOUS | Status: DC | PRN

## 2016-06-20 MED ORDER — LIDOCAINE-EPINEPHRINE (PF) 1 %-1:200000 IJ SOLN
INTRAMUSCULAR | Status: AC
  Filled 2016-06-20: qty 30

## 2016-06-20 MED ORDER — ACETAMINOPHEN 325 MG PO TABS: 325.0000 mg | ORAL_TABLET | ORAL | Status: DC | PRN

## 2016-06-20 MED ORDER — ACETAMINOPHEN 325 MG RE SUPP
325.0000 mg | RECTAL | Status: DC | PRN
  Filled 2016-06-20: qty 2

## 2016-06-20 MED ORDER — CLINDAMYCIN PHOSPHATE 300 MG/50ML IV SOLN
300.0000 mg | Freq: Once | INTRAVENOUS | Status: AC
  Administered 2016-06-20: 300 mg via INTRAVENOUS

## 2016-06-20 MED ORDER — CLOPIDOGREL BISULFATE 75 MG PO TABS
75.0000 mg | ORAL_TABLET | Freq: Every day | ORAL | 11 refills | Status: DC
Start: 2016-06-20 — End: 2016-11-03

## 2016-06-20 MED ORDER — MIDAZOLAM HCL 5 MG/5ML IJ SOLN
INTRAMUSCULAR | Status: AC
  Filled 2016-06-20: qty 5

## 2016-06-20 MED ORDER — HYDROMORPHONE HCL 1 MG/ML IJ SOLN: 1.0000 mg | Freq: Once | INTRAMUSCULAR | Status: DC

## 2016-06-20 MED ORDER — HEPARIN SODIUM (PORCINE) 1000 UNIT/ML IJ SOLN
INTRAMUSCULAR | Status: AC
  Filled 2016-06-20: qty 1

## 2016-06-20 MED ORDER — CLINDAMYCIN PHOSPHATE 300 MG/50ML IV SOLN
INTRAVENOUS | Status: AC
  Filled 2016-06-20: qty 50

## 2016-06-20 MED ORDER — METOPROLOL TARTRATE 5 MG/5ML IV SOLN: 2.0000 mg | INTRAVENOUS | Status: DC | PRN

## 2016-06-20 MED ORDER — OXYCODONE-ACETAMINOPHEN 5-325 MG PO TABS: 1.0000 | ORAL_TABLET | ORAL | Status: DC | PRN

## 2016-06-20 MED ORDER — SODIUM CHLORIDE 0.9 % IV SOLN
INTRAVENOUS | Status: DC
  Administered 2016-06-20: 1000 mL via INTRAVENOUS

## 2016-06-20 SURGICAL SUPPLY — 21 items
BALLN LUTONIX 5X150X130 (BALLOONS) ×3
BALLN LUTONIX DCB 7X60X130 (BALLOONS) ×3
BALLN ULTRVRSE 3X150X150 (BALLOONS) ×3
BALLOON LUTONIX 5X150X130 (BALLOONS) ×1 IMPLANT
BALLOON LUTONIX DCB 7X60X130 (BALLOONS) ×1 IMPLANT
BALLOON ULTRVRSE 3X150X150 (BALLOONS) ×1 IMPLANT
CATH PIG 70CM (CATHETERS) ×3 IMPLANT
CATH RIM 65CM (CATHETERS) ×3 IMPLANT
CATH VERT 100CM (CATHETERS) ×3 IMPLANT
DEVICE PRESTO INFLATION (MISCELLANEOUS) ×3 IMPLANT
DEVICE STARCLOSE SE CLOSURE (Vascular Products) ×3 IMPLANT
DEVICE TORQUE (MISCELLANEOUS) ×3 IMPLANT
GLIDEWIRE ADV .035X260CM (WIRE) ×3 IMPLANT
PACK ANGIOGRAPHY (CUSTOM PROCEDURE TRAY) ×3 IMPLANT
SHEATH ANL2 6FRX45 HC (SHEATH) ×3 IMPLANT
SHEATH BRITE TIP 5FRX11 (SHEATH) ×3 IMPLANT
STENT TIGRIS 6X100X120 (Permanent Stent) ×3 IMPLANT
SYR MEDRAD MARK V 150ML (SYRINGE) ×3 IMPLANT
TUBING CONTRAST HIGH PRESS 72 (TUBING) ×3 IMPLANT
WIRE G V18X300CM (WIRE) ×3 IMPLANT
WIRE J 3MM .035X145CM (WIRE) ×3 IMPLANT

## 2016-06-20 NOTE — H&P (Signed)
  [facility] History & Physical Update  The patient was interviewed and re-examined.  The patient's previous History and Physical has been reviewed and is unchanged.  There is no change in the plan of care. We plan to proceed with the scheduled procedure.  DEW,JASON, MD  06/20/2016, 8:12 AM

## 2016-06-20 NOTE — Op Note (Signed)
[facility] Percutaneous Study/Intervention Procedural Note   Date of Surgery: 06/20/2016  Surgeon(s):DEW,JASON   Assistants:none  Pre-operative Diagnosis: PAD with  rest pain right leg  Post-operative diagnosis: Same  Procedure(s) Performed: 1. Ultrasound guidance for vascular access  left femoral artery 2. Catheter placement into  right posterior tibial artery from left femoral approach 3. Aortogram and selective  right lower extremity angiogram 4. Percutaneous transluminal angioplasty of  right posterior tibial artery with 3 mm diameter by 15 cm length angioplasty balloon 5. Percutaneous transluminal angioplasty of right distal SFA and popliteal artery with 5 mm diameter x 15 cm length Lutonix drug-coated angioplasty balloon  6.  Tigris stent placement to the right distal SFA and above-knee popliteal artery with 6 mm diameter by 10 cm length stent for greater than 50% stenosis after angioplasty  7.   Percutaneous transluminal angioplasty of right external iliac artery and proximal common femoral artery with 7 mm diameter by 6 cm length Lutonix drug-coated angioplasty balloon 8. StarClose closure device  left femoral artery  EBL:  15 cc  Contrast:  70 cc  Fluoro Time:  11.7 minutes  Moderate Conscious Sedation Time: approximately  55 minutes using  5 mg of Versed and  150 mcg of Fentanyl  Indications: Patient is a 75 y.o.male with  claudication and symptoms worrisome for ischemic rest pain of the right leg. The patient has noninvasive study showing  markedly reduced ABI on the right and a normal ABI on the left. The patient is brought in for angiography for further evaluation and potential treatment. Risks and benefits are discussed and informed consent is obtained  Procedure: The patient was identified and appropriate procedural time out was performed. The  patient was then placed supine on the table and prepped and draped in the usual sterile fashion.Moderate conscious sedation was administered during a face to face encounter with the patient throughout the procedure with my supervision of the RN administering medicines and monitoring the patient's vital signs, pulse oximetry, telemetry and mental status throughout from the start of the procedure until the patient was taken to the recovery room. Ultrasound was used to evaluate the  left common femoral artery. It was patent  but calcific. A digital ultrasound image was acquired. A Seldinger needle was used to access the  left common femoral artery under direct ultrasound guidance and a permanent image was performed. A 0.035 J wire was advanced without resistance and a 5Fr sheath was placed. Pigtail catheter was placed into the aorta and an AP aortogram was performed. This demonstrated normal renal arteries and normal aorta and iliac segments without significant stenosis on the left although calcific disease and mild narrowing in the 30% range was seen.  The right common iliac artery was patent, but in the distal external iliac artery about a cm or two above the femoral head, a 60-65% stenosis was present. I then crossed the aortic bifurcation and advanced to the  right femoral head. Selective  right lower extremity angiogram was then performed. This demonstrated that the common femoral artery had some tapered disease after the external iliac artery stenosis that was mild to moderate in nature. The proximal SFA was calcific but patent. In the distal SFA there was very calcific disease which created an occlusion through Hunter's canal with reconstitution in the above-knee popliteal artery. The vessel then normalized and there was a normal tibial trifurcation. The peroneal artery appeared to be continuous although smaller and had its typical termination of the  ankle. The anterior tibial artery was patent proximally  but then occluded without a good distal reconstitution seen. The posterior tibial artery was the largest most robust runoff, but it had a near occlusive stenosis in the mid to distal segment over a short segment of a few centimeters.. The patient was systemically heparinized and a 6 [place] Ansell sheath was then placed over the [employer] wire. I then used a Kumpe catheter and the advantage wire to navigate through the iliac and common femoral disease, and then easily crossed the distal SFA and popliteal occlusion confirming intraluminal flow in the popliteal artery below the knee. I then exchanged for a 0.018 wire to navigate through the posterior tibial artery stenosis in the mid to distal segment and parked the wire in the foot. I then proceeded with treatment. A 3 mm diameter by 15 cm length angioplasty balloon was used to treat the posterior tibial artery. This was inflated to 8 atm for 1 minute and when the balloon was deflated and a less than 20% residual stenosis was identified. I used this balloon to predilate the calcific lesion in the distal SFA and popliteal artery and inflated it to 10 atm for 1 minute. I then selected a 5 mm diameter by 15 cm length Lutonix drug-coated angioplasty balloon to treat the distal SFA and above-knee popliteal arteries. This was inflated to 8 atm for 1 minute. Completion and then following this showed dissection and near occlusive residual stenosis in the area of occlusion. I elected to treat this with a stent. A 6 mm diameter by 10 cm length high grade stent was selected and deployed in the distal SFA and above-knee popliteal artery and then postdilated with a 5 mm balloon with excellent angiographic completion result and less than 20% residual stenosis. I then turned my attention to the distal external iliac artery lesion on the right. The sheath was pulled back up to the common iliac artery. A 7 mm diameter by 6 cm length Lutonix drug-coated angioplasty balloon was  selected for treatment. It was centered on this lesion the distal external iliac artery with the distal edge of the balloon in the mid common femoral artery and the proximal edge the balloon in the proximal to mid external iliac artery. It was inflated to 10 atm for 1 minute with a waist resolved at 8-10 atm. Completion angiogram showed about a 20-25% residual stenosis which was not flow limiting. I elected to terminate the procedure. The sheath was removed and StarClose closure device was deployed in the left femoral artery with excellent hemostatic result. The patient was taken to the recovery room in stable condition having tolerated the procedure well.  Findings:  Aortogram: This demonstrated normal renal arteries and normal aorta and iliac segments without significant stenosis on the left although calcific disease and mild narrowing in the 30% range was seen.  The right common iliac artery was patent, but in the distal external iliac artery about a cm or two above the femoral head, a 60-65% stenosis was present Right Lower Extremity: The common femoral artery had some tapered disease after the external iliac artery stenosis that was mild to moderate in nature. The proximal SFA was calcific but patent. In the distal SFA there was very calcific disease which created an occlusion through Hunter's canal with reconstitution in the above-knee popliteal artery. The vessel then normalized and there was a normal tibial trifurcation. The peroneal artery appeared to be continuous although smaller and had its typical termination of  the ankle. The anterior tibial artery was patent proximally but then occluded without a good distal reconstitution seen. The posterior tibial artery was the largest most robust runoff, but it had a near occlusive stenosis in the mid to distal segment over a short segment of a few centimeters.   Disposition: Patient was taken to the recovery room in stable  condition having tolerated the procedure well.  Complications: None  DEW,JASON 06/20/2016 9:33 AM

## 2016-06-20 NOTE — Progress Notes (Signed)
Patient tolerated PO without complication. Alert and oriented. Vitals WNL.  Patient D/C to home, daughter here to transport.

## 2016-06-21 ENCOUNTER — Ambulatory Visit: Payer: Medicare Other | Admitting: Cardiology

## 2016-06-28 NOTE — Progress Notes (Signed)
Cardiology Office Note   Date:  07/06/2016   ID:  Edwin Moran, DOB 05-08-1941, MRN [id number]  Referring Doctor:  Elsie Stain, MD   Cardiologist:   Wende Bushy, MD   Reason for consultation:  Chief Complaint  Patient presents with  . Other    Ref by Dr. Damita Dunnings for cardiac evaluation in setting of PAD. Pt. c/o bruising, bad dreams and a dry cough since started new medications. Meds reviewed by the patient verbally.       History of Present Illness: Edwin Moran is a 75 y.o. male who presents for Evaluation for CAD. Patient recently diagnosed with PAD undergoing lower extremity angiography and stenting. Patient follows up with Dr. Lucky Cowboy.  Patient denies chest pain. He does have some shortness of breath with exertion. This is mild in intensity, lasting a few minutes at a time, resolved with rest. This has been going on for 2 years now. No progression of symptoms. Symptoms in the chest, nonradiating.  He continues to smoke but is now trying to quit. He started smoking at age 29.  He denies claudication, PND, orthopnea, edema. No palpitations.  ROS:  Please see the history of present illness. Aside from mentioned under HPI, all other systems are reviewed and negative.     Past Medical History:  Diagnosis Date  . Diverticulosis of colon   . History of GI diverticular bleed 03/20-03/31/04   [facility]  . Hyperlipidemia   . Hypertension   . Osteoarthritis   . Renal insufficiency   . Smoking     Past Surgical History:  Procedure Laterality Date  . CYSTOSCOPY  1991   due to prolonged prostatitis  . 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 PERIPHERAL VASCULAR CATHETERIZATION Right 06/20/2016   Procedure: Lower Extremity Angiography;  Surgeon: Algernon Huxley, MD;  Location: [facility];  Service: Cardiovascular;  Laterality: Right;     reports that he has been smoking.  He has a 40.00 pack-year smoking  history. He has never used smokeless tobacco. He reports that he drinks about 8.4 oz of alcohol per week . He reports that he does not use drugs.   family history includes Asthma in his mother; Heart disease in his mother; Hiatal hernia in his mother; Thrombophlebitis in his mother.   Outpatient Medications Prior to Visit  Medication Sig Dispense Refill  . albuterol (PROVENTIL HFA;VENTOLIN HFA) 108 (90 Base) MCG/ACT inhaler Inhale 1-2 puffs into the lungs every 6 (six) hours as needed for wheezing or shortness of breath. 1 Inhaler 12  . amLODipine (NORVASC) 10 MG tablet TAKE 1 TABLET (10 MG TOTAL) BY MOUTH AT BEDTIME. 90 tablet 3  . aspirin EC 81 MG tablet Take 1 tablet (81 mg total) by mouth daily. 150 tablet 2  . atorvastatin (LIPITOR) 20 MG tablet Take 1 tablet (20 mg total) by mouth daily. 30 tablet 5  . bisoprolol-hydrochlorothiazide (ZIAC) 5-6.25 MG tablet Take 1 tablet by mouth daily. 90 tablet 3  . budesonide-formoterol (SYMBICORT) 160-4.5 MCG/ACT inhaler Inhale 2 puffs into the lungs 2 (two) times daily. 3 Inhaler 3  . cholecalciferol (VITAMIN D) 1000 UNITS tablet Take 1,000 Units by mouth daily.    . clopidogrel (PLAVIX) 75 MG tablet Take 1 tablet (75 mg total) by mouth daily. 30 tablet 11  . GLUCOSAMINE CHONDROITIN COMPLX PO Take 2 tablets by mouth daily.     . quinapril (ACCUPRIL) 40 MG tablet TAKE 1  TABLET (40 MG TOTAL) BY MOUTH DAILY. 90 tablet 3  . vitamin B-12 (CYANOCOBALAMIN) 1000 MCG tablet Take 1,000 mcg by mouth daily.       No facility-administered medications prior to visit.      Allergies: Cefprozil; Penicillins; and Potassium chloride    PHYSICAL EXAM: VS:  BP 120/64 (BP Location: Right Arm, Patient Position: Sitting, Cuff Size: Normal)   Pulse 62   Ht '5\' 4"'$  (1.626 m)   Wt 166 lb 12 oz (75.6 kg)   BMI 28.62 kg/m  , Body mass index is 28.62 kg/m. Wt Readings from Last 3 Encounters:  07/06/16 166 lb 12 oz (75.6 kg)  06/20/16 168 lb (76.2 kg)  06/14/16 168 lb  (76.2 kg)    GENERAL:  well developed, well nourished, not in acute distress HEENT: normocephalic, pink conjunctivae, anicteric sclerae, no xanthelasma, normal dentition, oropharynx clear NECK:  no neck vein engorgement, JVP normal, no hepatojugular reflux, carotid upstroke brisk and symmetric, no bruit, no thyromegaly, no lymphadenopathy LUNGS:  good respiratory effort, clear to auscultation bilaterally CV:  PMI not displaced, no thrills, no lifts, S1 and S2 within normal limits, no palpable S3 or S4, no murmurs, no rubs, no gallops ABD:  Soft, nontender, nondistended, normoactive bowel sounds, no abdominal aortic bruit, no hepatomegaly, no splenomegaly MS: nontender back, no kyphosis, no scoliosis, no joint deformities EXT:  2+ DP/PT pulses, no edema, no varicosities, no cyanosis, no clubbing SKIN: warm, nondiaphoretic, normal turgor, no ulcers NEUROPSYCH: alert, oriented to person, place, and time, sensory/motor grossly intact, normal mood, appropriate affect  Recent Labs: 06/14/2016: ALT 16; Hemoglobin 15.1; Platelets 232.0; Potassium 3.4; Sodium 136 06/17/2016: BUN 24; Creatinine, Ser 0.97   Lipid Panel    Component Value Date/Time   CHOL 163 06/14/2016 0950   TRIG 76.0 06/14/2016 0950   HDL 57.70 06/14/2016 0950   CHOLHDL 3 06/14/2016 0950   VLDL 15.2 06/14/2016 0950   LDLCALC 91 06/14/2016 0950     Other studies Reviewed:  EKG:  The ekg from 07/06/2016 was personally reviewed by me and it revealed sinus rhythm, 60 BPM.  Additional studies/ records that were reviewed personally reviewed by me today include:  Dr. Lucky Cowboy 06/20/2016:  Procedure(s) Performed: 1. Ultrasound guidance for vascular access  left femoral artery 2. Catheter placement into  right posterior tibial artery from left femoral approach 3. Aortogram and selective  right lower extremity angiogram 4. Percutaneous transluminal angioplasty of  right posterior  tibial artery with 3 mm diameter by 15 cm length angioplasty balloon 5. Percutaneous transluminal angioplasty of right distal SFA and popliteal artery with 5 mm diameter x 15 cm length Lutonix drug-coated angioplasty balloon                       6.  Tigris stent placement to the right distal SFA and above-knee popliteal artery with 6 mm diameter by 10 cm length stent for greater than 50% stenosis after angioplasty                       7.   Percutaneous transluminal angioplasty of right external iliac artery and proximal common femoral artery with 7 mm diameter by 6 cm length Lutonix drug-coated angioplasty balloon 8. StarClose closure device  left femoral artery  ASSESSMENT AND PLAN:  Shortness of breath Recommend echocardiogram. Recommend evaluating for CAD/ischemia in the setting of recent diagnosis of CAD. Recommend pharmacologic nuclear stress test. In the meantime, agree with continuing  treatment for PAD: Aspirin, Plavix, statin therapy. LDL goal is less than 70.  Hypertension BP is well controlled. Continue monitoring BP. Continue current medical therapy and lifestyle changes.  Hyperlipidemia Lipid levels at goal (LDL < 70). Continue current medical therapy and lifestyle changes. PCP following.  Tobacco abuse We discussed the importance of smoking cessation and different strategies for quitting.   Current medicines are reviewed at length with the patient today.  The patient does not have concerns regarding medicines.  Labs/ tests ordered today include:  Orders Placed This Encounter  Procedures  . NM Myocar Multi W/Spect W/Wall Motion / EF  . EKG 12-Lead  . ECHOCARDIOGRAM COMPLETE    I had a lengthy and detailed discussion with the patient regarding diagnoses, prognosis, diagnostic options, treatment options , and side effects of medications.   I counseled the patient on importance of lifestyle modification including heart healthy diet, regular  physical activity , and smoking cessation.   Disposition:   FU with undersigned after tests   Signed, Wende Bushy, MD  07/06/2016 12:22 PM    [place]  This note was generated in part with voice recognition software and I apologize for any typographical errors that were not detected and corrected.

## 2016-07-06 ENCOUNTER — Ambulatory Visit (INDEPENDENT_AMBULATORY_CARE_PROVIDER_SITE_OTHER): Payer: Medicare Other | Admitting: Cardiology

## 2016-07-06 ENCOUNTER — Encounter: Payer: Self-pay | Admitting: Cardiology

## 2016-07-06 VITALS — BP 120/64 | HR 62 | Ht 64.0 in | Wt 166.8 lb

## 2016-07-06 DIAGNOSIS — F17200 Nicotine dependence, unspecified, uncomplicated: Secondary | ICD-10-CM

## 2016-07-06 DIAGNOSIS — I739 Peripheral vascular disease, unspecified: Secondary | ICD-10-CM

## 2016-07-06 DIAGNOSIS — R0602 Shortness of breath: Secondary | ICD-10-CM | POA: Diagnosis not present

## 2016-07-06 DIAGNOSIS — I10 Essential (primary) hypertension: Secondary | ICD-10-CM | POA: Diagnosis not present

## 2016-07-06 DIAGNOSIS — E785 Hyperlipidemia, unspecified: Secondary | ICD-10-CM

## 2016-07-06 NOTE — Patient Instructions (Addendum)
Testing/Procedures: Your physician has requested that you have an echocardiogram. Echocardiography is a painless test that uses sound waves to create images of your heart. It provides your doctor with information about the size and shape of your heart and how well your heart's chambers and valves are working. This procedure takes approximately one hour. There are no restrictions for this procedure.  [place]  Your caregiver has ordered a Stress Test with nuclear imaging. The purpose of this test is to evaluate the blood supply to your heart muscle. This procedure is referred to as a "Non-Invasive Stress Test." This is because other than having an IV started in your vein, nothing is inserted or "invades" your body. Cardiac stress tests are done to find areas of poor blood flow to the heart by determining the extent of coronary artery disease (CAD). Some patients exercise on a treadmill, which naturally increases the blood flow to your heart, while others who are  unable to walk on a treadmill due to physical limitations have a pharmacologic/chemical stress agent called Lexiscan . This medicine will mimic walking on a treadmill by temporarily increasing your coronary blood flow.   Please note: these test may take anywhere between 2-4 hours to complete  PLEASE REPORT TO [facility] AT THE FIRST DESK WILL DIRECT YOU WHERE TO GO  Date of Procedure:_Wednesday July 13, 2016 at 08:00AM__  Arrival Time for Procedure:__Arrive at 07:45AM to register __  Instructions regarding medication:   __X__:  Hold Bisoprolol-hydrochlorothiazide (Ziac) the night before procedure and morning of procedure   PLEASE NOTIFY THE OFFICE AT LEAST 24 HOURS IN ADVANCE IF YOU ARE UNABLE TO KEEP YOUR APPOINTMENT.  [phone] AND  PLEASE NOTIFY NUCLEAR MEDICINE AT [facility] AT LEAST 24 HOURS IN ADVANCE IF YOU ARE UNABLE TO KEEP YOUR APPOINTMENT. [phone]  How to prepare for your Myoview  test:  1. Do not eat or drink after midnight 2. No caffeine for 24 hours prior to test 3. No smoking 24 hours prior to test. 4. Your medication may be taken with water.  If your doctor stopped a medication because of this test, do not take that medication. 5. Ladies, please do not wear dresses.  Skirts or pants are appropriate. Please wear a short sleeve shirt. 6. No perfume, cologne or lotion. 7. Wear comfortable walking shoes. No heels!   Follow-Up: Your physician recommends that you schedule a follow-up appointment after testing with Dr. Yvone Neu.  It was a pleasure seeing you today here in the office. Please do not hesitate to give [place] a call back if you have any further questions. [place], BSN      Echocardiogram An echocardiogram, or echocardiography, uses sound waves (ultrasound) to produce an image of your heart. The echocardiogram is simple, painless, obtained within a short period of time, and offers valuable information to your health care provider. The images from an echocardiogram can provide information such as:  Evidence of coronary artery disease (CAD).  Heart size.  Heart muscle function.  Heart valve function.  Aneurysm detection.  Evidence of a past heart attack.  Fluid buildup around the heart.  Heart muscle thickening.  Assess heart valve function. LET [facility] CARE PROVIDER KNOW ABOUT:  Any allergies you have.  All medicines you are taking, including vitamins, herbs, eye drops, creams, and over-the-counter medicines.  Previous problems you or members of your family have had with the use of anesthetics.  Any blood disorders you  have.  Previous surgeries you have had.  Medical conditions you have.  Possibility of pregnancy, if this applies. BEFORE THE PROCEDURE  No special preparation is needed. Eat and drink normally.  PROCEDURE   In order to produce an image of your heart, gel will be applied to your chest and a  wand-like tool (transducer) will be moved over your chest. The gel will help transmit the sound waves from the transducer. The sound waves will harmlessly bounce off your heart to allow the heart images to be captured in real-time motion. These images will then be recorded.  You may need an IV to receive a medicine that improves the quality of the pictures. AFTER THE PROCEDURE You may return to your normal schedule including diet, activities, and medicines, unless your health care provider tells you otherwise.   This information is not intended to replace advice given to you by your health care provider. Make sure you discuss any questions you have with your health care provider.   Document Released: 10/14/2000 Document Revised: 11/07/2014 Document Reviewed: 06/24/2013 Elsevier Interactive Patient Education 2016 [place].    Pharmacologic Stress Electrocardiogram A pharmacologic stress electrocardiogram is a heart (cardiac) test that uses nuclear imaging to evaluate the blood supply to your heart. This test may also be called a pharmacologic stress electrocardiography. Pharmacologic means that a medicine is used to increase your heart rate and blood pressure.  This stress test is done to find areas of poor blood flow to the heart by determining the extent of coronary artery disease (CAD). Some people exercise on a treadmill, which naturally increases the blood flow to the heart. For those people unable to exercise on a treadmill, a medicine is used. This medicine stimulates your heart and will cause your heart to beat harder and more quickly, as if you were exercising.  Pharmacologic stress tests can help determine:  The adequacy of blood flow to your heart during increased levels of activity in order to clear you for discharge home.  The extent of coronary artery blockage caused by CAD.  Your prognosis if you have suffered a heart attack.  The effectiveness of cardiac procedures done,  such as an angioplasty, which can increase the circulation in your coronary arteries.  Causes of chest pain or pressure. LET [facility] CARE PROVIDER KNOW ABOUT:  Any allergies you have.  All medicines you are taking, including vitamins, herbs, eye drops, creams, and over-the-counter medicines.  Previous problems you or members of your family have had with the use of anesthetics.  Any blood disorders you have.  Previous surgeries you have had.  Medical conditions you have.  Possibility of pregnancy, if this applies.  If you are currently breastfeeding. RISKS AND COMPLICATIONS Generally, this is a safe procedure. However, as with any procedure, complications can occur. Possible complications include:  You develop pain or pressure in the following areas:  Chest.  Jaw or neck.  Between your shoulder blades.  Radiating down your left arm.  Headache.  Dizziness or light-headedness.  Shortness of breath.  Increased or irregular heartbeat.  Low blood pressure.  Nausea or vomiting.  Flushing.  Redness going up the arm and slight pain during injection of medicine.  Heart attack (rare). BEFORE THE PROCEDURE   Avoid all forms of caffeine for 24 hours before your test or as directed by your health care provider. This includes coffee, tea (even decaffeinated tea), caffeinated sodas, chocolate, cocoa, and certain pain medicines.  Follow your health care provider's  instructions regarding eating and drinking before the test.  Take your medicines as directed at regular times with water unless instructed otherwise. Exceptions may include:  If you have diabetes, ask how you are to take your insulin or pills. It is common to adjust insulin dosing the morning of the test.  If you are taking beta-blocker medicines, it is important to talk to your health care provider about these medicines well before the date of your test. Taking beta-blocker medicines may interfere with the  test. In some cases, these medicines need to be changed or stopped 24 hours or more before the test.  If you wear a nitroglycerin patch, it may need to be removed prior to the test. Ask your health care provider if the patch should be removed before the test.  If you use an inhaler for any breathing condition, bring it with you to the test.  If you are an outpatient, bring a snack so you can eat right after the stress phase of the test.  Do not smoke for 4 hours prior to the test or as directed by your health care provider.  Do not apply lotions, powders, creams, or oils on your chest prior to the test.  Wear comfortable shoes and clothing. Let your health care provider know if you were unable to complete or follow the preparations for your test. PROCEDURE   Multiple patches (electrodes) will be put on your chest. If needed, small areas of your chest may be shaved to get better contact with the electrodes. Once the electrodes are attached to your body, multiple wires will be attached to the electrodes, and your heart rate will be monitored.  An IV access will be started. A nuclear trace (isotope) is given. The isotope may be given intravenously, or it may be swallowed. Nuclear refers to several types of radioactive isotopes, and the nuclear isotope lights up the arteries so that the nuclear images are clear. The isotope is absorbed by your body. This results in low radiation exposure.  A resting nuclear image is taken to show how your heart functions at rest.  A medicine is given through the IV access.  A second scan is done about 1 hour after the medicine injection and determines how your heart functions under stress.  During this stress phase, you will be connected to an electrocardiogram machine. Your blood pressure and oxygen levels will be monitored. AFTER THE PROCEDURE   Your heart rate and blood pressure will be monitored after the test.  You may return to your normal schedule,  including diet,activities, and medicines, unless your health care provider tells you otherwise.   This information is not intended to replace advice given to you by your health care provider. Make sure you discuss any questions you have with your health care provider.   Document Released: 03/05/2009 Document Revised: 10/22/2013 Document Reviewed: 06/24/2013 Elsevier Interactive Patient Education [employer].

## 2016-07-13 ENCOUNTER — Encounter
Admission: RE | Admit: 2016-07-13 | Discharge: 2016-07-13 | Disposition: A | Discharge status: Home / Self Care | Payer: Medicare Other | Source: Ambulatory Visit | Attending: Cardiology | Admitting: Cardiology

## 2016-07-13 DIAGNOSIS — I10 Essential (primary) hypertension: Secondary | ICD-10-CM | POA: Diagnosis present

## 2016-07-13 DIAGNOSIS — R0602 Shortness of breath: Secondary | ICD-10-CM | POA: Diagnosis not present

## 2016-07-13 DIAGNOSIS — I739 Peripheral vascular disease, unspecified: Secondary | ICD-10-CM | POA: Insufficient documentation

## 2016-07-13 MED ORDER — TECHNETIUM TC 99M TETROFOSMIN IV KIT
30.0600 | PACK | Freq: Once | INTRAVENOUS | Status: AC | PRN
  Administered 2016-07-13: 30.06 via INTRAVENOUS

## 2016-07-13 MED ORDER — TECHNETIUM TC 99M TETROFOSMIN IV KIT
13.0000 | PACK | Freq: Once | INTRAVENOUS | Status: AC | PRN
  Administered 2016-07-13: 12.87 via INTRAVENOUS

## 2016-07-13 MED ORDER — REGADENOSON 0.4 MG/5ML IV SOLN
0.4000 mg | Freq: Once | INTRAVENOUS | Status: AC
  Administered 2016-07-13: 0.4 mg via INTRAVENOUS

## 2016-07-14 LAB — NM MYOCAR MULTI W/SPECT W/WALL MOTION / EF
CHL CUP MPHR: 146 {beats}/min
CHL CUP RESTING HR STRESS: 60 {beats}/min
CSEPED: 0 min
CSEPEDS: 0 s
CSEPHR: 61 %
Estimated workload: 1 METS
LV sys vol: 21 mL
LVDIAVOL: 68 mL (ref 62–150)
Peak HR: 90 {beats}/min
SDS: 0
SRS: 1
SSS: 0
TID: 0.96

## 2016-07-22 ENCOUNTER — Other Ambulatory Visit: Payer: Self-pay

## 2016-07-22 ENCOUNTER — Ambulatory Visit (INDEPENDENT_AMBULATORY_CARE_PROVIDER_SITE_OTHER): Payer: Medicare Other

## 2016-07-22 DIAGNOSIS — R0602 Shortness of breath: Secondary | ICD-10-CM | POA: Diagnosis not present

## 2016-07-22 DIAGNOSIS — I10 Essential (primary) hypertension: Secondary | ICD-10-CM

## 2016-07-22 DIAGNOSIS — I739 Peripheral vascular disease, unspecified: Secondary | ICD-10-CM

## 2016-07-26 NOTE — Progress Notes (Signed)
Cardiology Office Note   Date:  08/02/2016   ID:  Edwin Moran, DOB June 23, 1941, MRN [id number]  Referring Doctor:  Elsie Stain, MD   Cardiologist:   Wende Bushy, MD   Reason for consultation:  Chief Complaint  Patient presents with  . other    Follow up from Echo and Myoview. "doing well."       History of Present Illness: Edwin Moran is a 75 y.o. male who presents for Follow-up after testing   Patient denies chest pain. He does have some shortness of breath with exertion. This is mild in intensity, lasting a few minutes at a time, resolved with rest. This has been going on for 2 years now. No progression of symptoms. Symptoms in the chest, nonradiating.  He has not smoked for 4 days now. He will continue making that decision every day not to go back to smoking.  He denies claudication, PND, orthopnea, edema. No palpitations.  ROS:  Please see the history of present illness. Aside from mentioned under HPI, all other systems are reviewed and negative.     Past Medical History:  Diagnosis Date  . Diverticulosis of colon   . History of GI diverticular bleed 03/20-03/31/04   [facility]  . Hyperlipidemia   . Hypertension   . Osteoarthritis   . Renal insufficiency   . Smoking     Past Surgical History:  Procedure Laterality Date  . CYSTOSCOPY  1991   due to prolonged prostatitis  . 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 PERIPHERAL VASCULAR CATHETERIZATION Right 06/20/2016   Procedure: Lower Extremity Angiography;  Surgeon: Algernon Huxley, MD;  Location: [place] [facility];  Service: Cardiovascular;  Laterality: Right;     reports that he has been smoking.  He has a 40.00 pack-year smoking history. He has never used smokeless tobacco. He reports that he drinks about 8.4 oz of alcohol per week . He reports that he does not use drugs.   family history includes Asthma in his mother; Heart disease in  his mother; Hiatal hernia in his mother; Thrombophlebitis in his mother.   Outpatient Medications Prior to Visit  Medication Sig Dispense Refill  . albuterol (PROVENTIL HFA;VENTOLIN HFA) 108 (90 Base) MCG/ACT inhaler Inhale 1-2 puffs into the lungs every 6 (six) hours as needed for wheezing or shortness of breath. 1 Inhaler 12  . amLODipine (NORVASC) 10 MG tablet TAKE 1 TABLET (10 MG TOTAL) BY MOUTH AT BEDTIME. 90 tablet 3  . aspirin EC 81 MG tablet Take 1 tablet (81 mg total) by mouth daily. 150 tablet 2  . atorvastatin (LIPITOR) 20 MG tablet Take 1 tablet (20 mg total) by mouth daily. 30 tablet 5  . bisoprolol-hydrochlorothiazide (ZIAC) 5-6.25 MG tablet Take 1 tablet by mouth daily. 90 tablet 3  . budesonide-formoterol (SYMBICORT) 160-4.5 MCG/ACT inhaler Inhale 2 puffs into the lungs 2 (two) times daily. 3 Inhaler 3  . cholecalciferol (VITAMIN D) 1000 UNITS tablet Take 1,000 Units by mouth daily.    . clopidogrel (PLAVIX) 75 MG tablet Take 1 tablet (75 mg total) by mouth daily. 30 tablet 11  . GLUCOSAMINE CHONDROITIN COMPLX PO Take 2 tablets by mouth daily.     . quinapril (ACCUPRIL) 40 MG tablet TAKE 1 TABLET (40 MG TOTAL) BY MOUTH DAILY. 90 tablet 3  . vitamin B-12 (CYANOCOBALAMIN) 1000 MCG tablet Take 1,000 mcg by mouth daily.       No facility-administered  medications prior to visit.      Allergies: Cefprozil; Penicillins; and Potassium chloride    PHYSICAL EXAM: VS:  BP (!) 112/56 (BP Location: Left Arm, Patient Position: Sitting, Cuff Size: Normal)   Pulse 68   Ht '5\' 4"'$  (1.626 m)   Wt 164 lb 8 oz (74.6 kg)   BMI 28.24 kg/m  , Body mass index is 28.24 kg/m. Wt Readings from Last 3 Encounters:  08/02/16 164 lb 8 oz (74.6 kg)  07/06/16 166 lb 12 oz (75.6 kg)  06/20/16 168 lb (76.2 kg)    GENERAL:  well developed, well nourished, not in acute distress HEENT: normocephalic, pink conjunctivae, anicteric sclerae, no xanthelasma, normal dentition, oropharynx clear NECK:  no neck  vein engorgement, JVP normal, no hepatojugular reflux, carotid upstroke brisk and symmetric, no bruit, no thyromegaly, no lymphadenopathy LUNGS:  good respiratory effort, clear to auscultation bilaterally CV:  PMI not displaced, no thrills, no lifts, S1 and S2 within normal limits, no palpable S3 or S4, no murmurs, no rubs, no gallops ABD:  Soft, nontender, nondistended, normoactive bowel sounds, no abdominal aortic bruit, no hepatomegaly, no splenomegaly MS: nontender back, no kyphosis, no scoliosis, no joint deformities EXT:  2+ DP/PT pulses, no edema, no varicosities, no cyanosis, no clubbing SKIN: warm, nondiaphoretic, normal turgor, no ulcers NEUROPSYCH: alert, oriented to person, place, and time, sensory/motor grossly intact, normal mood, appropriate affect  Recent Labs: 06/14/2016: ALT 16; Hemoglobin 15.1; Platelets 232.0; Potassium 3.4; Sodium 136 06/17/2016: BUN 24; Creatinine, Ser 0.97   Lipid Panel    Component Value Date/Time   CHOL 163 06/14/2016 0950   TRIG 76.0 06/14/2016 0950   HDL 57.70 06/14/2016 0950   CHOLHDL 3 06/14/2016 0950   VLDL 15.2 06/14/2016 0950   LDLCALC 91 06/14/2016 0950     Other studies Reviewed:  EKG:  The ekg from 07/06/2016 was personally reviewed by me and it revealed sinus rhythm, 60 BPM.  Additional studies/ records that were reviewed personally reviewed by me today include:  Dr. Lucky Cowboy 06/20/2016:  Procedure(s) Performed: 1. Ultrasound guidance for vascular access  left femoral artery 2. Catheter placement into  right posterior tibial artery from left femoral approach 3. Aortogram and selective  right lower extremity angiogram 4. Percutaneous transluminal angioplasty of  right posterior tibial artery with 3 mm diameter by 15 cm length angioplasty balloon 5. Percutaneous transluminal angioplasty of right distal SFA and popliteal artery with 5 mm diameter x 15 cm length Lutonix  drug-coated angioplasty balloon                       6.  Tigris stent placement to the right distal SFA and above-knee popliteal artery with 6 mm diameter by 10 cm length stent for greater than 50% stenosis after angioplasty                       7.   Percutaneous transluminal angioplasty of right external iliac artery and proximal common femoral artery with 7 mm diameter by 6 cm length Lutonix drug-coated angioplasty balloon 8. StarClose closure device  left femoral artery  Echo 07/22/2016: Left ventricle: The cavity size was normal. Systolic function was   normal. The estimated ejection fraction was in the range of 50%   to 55%. Wall motion was normal; there were no regional wall   motion abnormalities. Doppler parameters are consistent with   abnormal left ventricular relaxation (grade 1 diastolic   dysfunction). - Mitral  valve: Calcified annulus. There was mild regurgitation. - Left atrium: The atrium was normal in size. - Right ventricle: Systolic function was normal. - Pulmonary arteries: Systolic pressure was within the normal   range.  Nuclear stress is 07/14/2016: Pharmacological myocardial perfusion imaging study with no significant  ischemia Normal wall motion, EF estimated at 49% (depressed EF possibly secondary to GI uptake artifact) No EKG changes concerning for ischemia at peak stress or in recovery. Low risk scan  ASSESSMENT AND PLAN:  Shortness of breath EF within normal limits and echo. No evidence of ischemia on stress testing. Negative clinically significant CAD is low. Continue risk factor modification. In the meantime, agree with continuing treatment for PAD: Aspirin, Plavix, statin therapy. LDL goal is less than 70.  Hypertension BP is well controlled. Continue monitoring BP. Continue current medical therapy and lifestyle changes.  Hyperlipidemia Lipid levels at goal (LDL < 70). Continue current medical therapy and lifestyle changes. PCP  following.  Tobacco abuse We discussed the importance of smoking cessation and different strategies for quitting. Commended him patient on his efforts.  Current medicines are reviewed at length with the patient today.  The patient does not have concerns regarding medicines.  Labs/ tests ordered today include:  No orders of the defined types were placed in this encounter.   I had a lengthy and detailed discussion with the patient regarding diagnoses, prognosis, diagnostic options, treatment options , and side effects of medications.   I counseled the patient on importance of lifestyle modification including heart healthy diet, regular physical activity , and smoking cessation.   Disposition:   FU with undersigned prn  Signed, Wende Bushy, MD  08/02/2016 10:05 AM    [place]  This note was generated in part with voice recognition software and I apologize for any typographical errors that were not detected and corrected.

## 2016-08-02 ENCOUNTER — Encounter: Payer: Self-pay | Admitting: Cardiology

## 2016-08-02 ENCOUNTER — Ambulatory Visit (INDEPENDENT_AMBULATORY_CARE_PROVIDER_SITE_OTHER): Payer: Medicare Other | Admitting: Cardiology

## 2016-08-02 VITALS — BP 112/56 | HR 68 | Ht 64.0 in | Wt 164.5 lb

## 2016-08-02 DIAGNOSIS — I10 Essential (primary) hypertension: Secondary | ICD-10-CM

## 2016-08-02 DIAGNOSIS — E785 Hyperlipidemia, unspecified: Secondary | ICD-10-CM

## 2016-08-02 DIAGNOSIS — F17200 Nicotine dependence, unspecified, uncomplicated: Secondary | ICD-10-CM | POA: Diagnosis not present

## 2016-08-02 DIAGNOSIS — I739 Peripheral vascular disease, unspecified: Secondary | ICD-10-CM | POA: Diagnosis not present

## 2016-08-02 NOTE — Patient Instructions (Signed)
Follow-Up: Your physician recommends that you schedule a follow-up appointment as needed.   It was a pleasure seeing you today here in the office. Please do not hesitate to give us a call back if you have any further questions. [phone]  Pamela A. RN, BSN    

## 2016-08-27 ENCOUNTER — Emergency Department: Payer: Medicare Other

## 2016-08-27 ENCOUNTER — Emergency Department
Admission: EM | Admit: 2016-08-27 | Discharge: 2016-08-27 | Disposition: A | Discharge status: Home / Self Care | Payer: Medicare Other | Attending: Emergency Medicine | Admitting: Emergency Medicine

## 2016-08-27 ENCOUNTER — Encounter: Payer: Self-pay | Admitting: Emergency Medicine

## 2016-08-27 DIAGNOSIS — R911 Solitary pulmonary nodule: Secondary | ICD-10-CM | POA: Diagnosis not present

## 2016-08-27 DIAGNOSIS — Z7982 Long term (current) use of aspirin: Secondary | ICD-10-CM | POA: Insufficient documentation

## 2016-08-27 DIAGNOSIS — Z79899 Other long term (current) drug therapy: Secondary | ICD-10-CM | POA: Diagnosis not present

## 2016-08-27 DIAGNOSIS — F17200 Nicotine dependence, unspecified, uncomplicated: Secondary | ICD-10-CM | POA: Diagnosis not present

## 2016-08-27 DIAGNOSIS — I10 Essential (primary) hypertension: Secondary | ICD-10-CM | POA: Insufficient documentation

## 2016-08-27 DIAGNOSIS — R319 Hematuria, unspecified: Secondary | ICD-10-CM | POA: Diagnosis not present

## 2016-08-27 LAB — COMPREHENSIVE METABOLIC PANEL
ALK PHOS: 112 U/L (ref 38–126)
ALT: 17 U/L (ref 17–63)
AST: 15 U/L (ref 15–41)
Albumin: 4 g/dL (ref 3.5–5.0)
Anion gap: 9 (ref 5–15)
BUN: 35 mg/dL — ABNORMAL HIGH (ref 6–20)
CALCIUM: 9.1 mg/dL (ref 8.9–10.3)
CO2: 27 mmol/L (ref 22–32)
CREATININE: 1.79 mg/dL — AB (ref 0.61–1.24)
Chloride: 99 mmol/L — ABNORMAL LOW (ref 101–111)
GFR, EST AFRICAN AMERICAN: 41 mL/min — AB (ref >60)
GFR, EST NON AFRICAN AMERICAN: 36 mL/min — AB (ref >60)
Glucose, Bld: 110 mg/dL — ABNORMAL HIGH (ref 65–99)
Potassium: 3.8 mmol/L (ref 3.5–5.1)
Sodium: 135 mmol/L (ref 135–145)
Total Bilirubin: 0.7 mg/dL (ref 0.3–1.2)
Total Protein: 8.2 g/dL — ABNORMAL HIGH (ref 6.5–8.1)

## 2016-08-27 LAB — CBC
HCT: 43.9 % (ref 40.0–52.0)
Hemoglobin: 15.3 g/dL (ref 13.0–18.0)
MCH: 32.9 pg (ref 26.0–34.0)
MCHC: 34.8 g/dL (ref 32.0–36.0)
MCV: 94.6 fL (ref 80.0–100.0)
PLATELETS: 225 10*3/uL (ref 150–440)
RBC: 4.64 MIL/uL (ref 4.40–5.90)
RDW: 13.7 % (ref 11.5–14.5)
WBC: 8.8 10*3/uL (ref 3.8–10.6)

## 2016-08-27 LAB — URINALYSIS COMPLETE WITH MICROSCOPIC (ARMC ONLY)
Bilirubin Urine: NEGATIVE
Glucose, UA: NEGATIVE mg/dL
KETONES UR: NEGATIVE mg/dL
LEUKOCYTES UA: NEGATIVE
Nitrite: NEGATIVE
PH: 5 (ref 5.0–8.0)
PROTEIN: 30 mg/dL — AB
Specific Gravity, Urine: 1.011 (ref 1.005–1.030)
Squamous Epithelial / LPF: NONE SEEN

## 2016-08-27 LAB — LIPASE, BLOOD: LIPASE: 30 U/L (ref 11–51)

## 2016-08-27 MED ORDER — SODIUM CHLORIDE 0.9 % IV BOLUS (SEPSIS)
1000.0000 mL | Freq: Once | INTRAVENOUS | Status: AC
  Administered 2016-08-27: 1000 mL via INTRAVENOUS

## 2016-08-27 NOTE — ED Provider Notes (Signed)
[facility] Emergency Department Provider Note  ____________________________________________   I have reviewed the triage vital signs and the nursing notes.   HISTORY  Chief Complaint Hematuria    HPI Edwin Moran is a 75 y.o. male presents today complaining of hematuria. He states he had a 20 years ago 1 day he has not had any since. He states that the hematuria was last night and now it is clear. He states that he has had no pain or fever or any dysuria or any other complaint. No rectal bleeding no history of easy bleeding. He is not on any anticoagulation medications he states. He denies any fever or chills.  No flank pain     Past Medical History:  Diagnosis Date  . Diverticulosis of colon   . History of GI diverticular bleed 03/20-03/31/04   [facility]  . Hyperlipidemia   . Hypertension   . Osteoarthritis   . Renal insufficiency   . Smoking     Patient Active Problem List   Diagnosis Date Noted  . PAD (peripheral artery disease) ([place]) 06/14/2016  . Advance care planning 10/07/2014  . SOB (shortness of breath) on exertion 01/02/2014  . Medicare annual wellness visit, subsequent 03/21/2013  . ARTHRITIS, CARPOMETACARPAL JOINT, BILATERAL 05/28/2009  . DISORDER, TOBACCO USE 05/11/2007  . Essential hypertension 05/11/2007  . ANEMIA, VITAMIN B12 DEFICIENCY 05/10/2007  . DIVERTICULOSIS, COLON 05/10/2007  . OSTEOARTHRITIS 05/10/2007    Past Surgical History:  Procedure Laterality Date  . CYSTOSCOPY  1991   due to prolonged prostatitis  . 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 PERIPHERAL VASCULAR CATHETERIZATION Right 06/20/2016   Procedure: Lower Extremity Angiography;  Surgeon: Algernon Huxley, MD;  Location: [place] [facility];  Service: Cardiovascular;  Laterality: Right;    Prior to Admission medications   Medication Sig Start Date End Date Taking? Authorizing Provider   albuterol (PROVENTIL HFA;VENTOLIN HFA) 108 (90 Base) MCG/ACT inhaler Inhale 1-2 puffs into the lungs every 6 (six) hours as needed for wheezing or shortness of breath. 11/11/15   Tonia Ghent, MD  amLODipine (NORVASC) 10 MG tablet TAKE 1 TABLET (10 MG TOTAL) BY MOUTH AT BEDTIME. 11/11/15   Tonia Ghent, MD  aspirin EC 81 MG tablet Take 1 tablet (81 mg total) by mouth daily. 06/20/16   Algernon Huxley, MD  atorvastatin (LIPITOR) 20 MG tablet Take 1 tablet (20 mg total) by mouth daily. 06/20/16   Algernon Huxley, MD  bisoprolol-hydrochlorothiazide [facility]) 5-6.25 MG tablet Take 1 tablet by mouth daily. 11/11/15   Tonia Ghent, MD  budesonide-formoterol [facility]) 160-4.5 MCG/ACT inhaler Inhale 2 puffs into the lungs 2 (two) times daily. 11/11/15   Tonia Ghent, MD  cholecalciferol (VITAMIN D) 1000 UNITS tablet Take 1,000 Units by mouth daily.    Historical Provider, MD  clopidogrel (PLAVIX) 75 MG tablet Take 1 tablet (75 mg total) by mouth daily. 06/20/16   Algernon Huxley, MD  GLUCOSAMINE CHONDROITIN COMPLX PO Take 2 tablets by mouth daily.     Historical Provider, MD  quinapril (ACCUPRIL) 40 MG tablet TAKE 1 TABLET (40 MG TOTAL) BY MOUTH DAILY. 11/11/15   Tonia Ghent, MD  vitamin B-12 (CYANOCOBALAMIN) 1000 MCG tablet Take 1,000 mcg by mouth daily.      Historical Provider, MD    Allergies Cefprozil; Penicillins; and Potassium chloride  Family History  Problem Relation Age of Onset  . Asthma Mother  chronic, asthma attack caused MI  . Heart disease Mother     MI  . Thrombophlebitis Mother   . Hiatal hernia Mother   . Prostate cancer Neg Hx   . Colon cancer Neg Hx     Social History Social History  Substance Use Topics  . Smoking status: Current Every Day Smoker    Packs/day: 1.00    Years: 40.00  . Smokeless tobacco: Never Used     Comment: off and on for 40 years, 1 PPD at first  . Alcohol use 8.4 oz/week    14 Standard drinks or equivalent per week     Comment: couple of drinks  a day    Review of Systems Constitutional: No fever/chills Eyes: No visual changes. ENT: No sore throat. No stiff neck no neck pain Cardiovascular: Denies chest pain. Respiratory: Denies shortness of breath. Gastrointestinal:   no vomiting.  No diarrhea.  No constipation. Genitourinary: Negative for dysuria. Musculoskeletal: Negative lower extremity swelling Skin: Negative for rash. Neurological: Negative for severe headaches, focal weakness or numbness. 10-point ROS otherwise negative.  ____________________________________________   PHYSICAL EXAM:  VITAL SIGNS: ED Triage Vitals [08/27/16 0822]  Enc Vitals Group     BP (!) 142/65     Pulse Rate 70     Resp 20     Temp 97.6 F (36.4 C)     Temp Source Oral     SpO2 97 %     Weight 165 lb (74.8 kg)     Height '5\' 4"'$  (1.626 m)     Head Circumference      Peak Flow      Pain Score      Pain Loc      Pain Edu?      Excl. in [place]?     Constitutional: Alert and oriented. Well appearing and in no acute distress. Eyes: Conjunctivae are normal. PERRL. EOMI. Head: Atraumatic. Nose: No congestion/rhinnorhea. Mouth/Throat: Mucous membranes are moist.  Oropharynx non-erythematous. Neck: No stridor.   Nontender with no meningismus Cardiovascular: Normal rate, regular rhythm. Grossly normal heart sounds.  Good peripheral circulation. Respiratory: Normal respiratory effort.  No retractions. Lungs CTAB. Abdominal: Soft and nontender. No distention. No guarding no rebound Back:  There is no focal tenderness or step off.  there is no midline tenderness there are no lesions noted. there is no CVA tenderness Emotional male genitalia, no tenderness, circumcised, no bleeding noted, no testicular mass or swelling Musculoskeletal: No lower extremity tenderness, no upper extremity tenderness. No joint effusions, no DVT signs strong distal pulses no edema Neurologic:  Normal speech and language. No gross focal neurologic deficits are  appreciated.  Skin:  Skin is warm, dry and intact. No rash noted. Psychiatric: Mood and affect are normal. Speech and behavior are normal.  ____________________________________________   LABS (all labs ordered are listed, but only abnormal results are displayed)  Labs Reviewed  COMPREHENSIVE METABOLIC PANEL - Abnormal; Notable for the following:       Result Value   Chloride 99 (*)    Glucose, Bld 110 (*)    BUN 35 (*)    Creatinine, Ser 1.79 (*)    Total Protein 8.2 (*)    GFR calc non Af Amer 36 (*)    GFR calc Af Amer 41 (*)    All other components within normal limits  URINALYSIS COMPLETEWITH MICROSCOPIC ([facility] ONLY) - Abnormal; Notable for the following:    Color, Urine YELLOW (*)    APPearance CLEAR (*)  Hgb urine dipstick 3+ (*)    Protein, ur 30 (*)    Bacteria, UA RARE (*)    All other components within normal limits  URINE CULTURE  LIPASE, BLOOD  CBC  CYTOLOGY - NON PAP   ____________________________________________  EKG  I personally interpreted any EKGs ordered by me or triage  ____________________________________________  RADIOLOGY  I reviewed any imaging ordered by me or triage that were performed during my shift and, if possible, patient and/or family made aware of any abnormal findings. ____________________________________________   PROCEDURES  Procedure(s) performed: None  Procedures  Critical Care performed: None  ____________________________________________   INITIAL IMPRESSION / ASSESSMENT AND PLAN / ED COURSE  Pertinent labs & imaging results that were available during my care of the patient were reviewed by me and considered in my medical decision making (see chart for details).  With painless hematuria, elevated creatinine over baseline, CT scan shows indeterminate findings of hydronephrosis. Patient made aware of all CT findings including lung nodule and the need for follow-up. Did discuss with urology, Dr. Noah Delaine, who agrees  with management, will follow-up. His concern is for possible ureteral oncologic process. He did request cytology and he will follow up on it. Urine culture also pending. Urology does not feel the patient requires antibiotics and I agree.  Clinical Course   ____________________________________________   FINAL CLINICAL IMPRESSION(S) / ED DIAGNOSES  Final diagnoses:  None      This chart was dictated using voice recognition software.  Despite best efforts to proofread,  errors can occur which can change meaning.      Schuyler Amor, MD 08/27/16 1224

## 2016-08-27 NOTE — ED Triage Notes (Signed)
Pt to ED with c/o of blood in urine that started yesterday morning. Pt states that it was dark initially, became brighter yesterday and eventually "pink". Pt states he has LLQ "aching" feeling before seeing discolor in urine.

## 2016-08-27 NOTE — Discharge Instructions (Signed)
If you have increased bleeding, pain, fever, or you feel worse in any way return to the emergency room. You do have a lung nodule. We do ask that you follow closely with outpatient physician for rescheduled imaging of that. We are including the CT finding results below. In addition, we are concerned about her hematuria, while we are not sure what is causing it at this time, we feel that there is possibly this can be caused by something significant and we do ask therefore that you follow very closely with urology as an outpatient. Return to the emergency room if you feel worse in any way.   IMPRESSION: 1. Mild left hydroureteronephrosis to the level of the mid pelvic segment of the left ureter, of indeterminate etiology. No urolithiasis. No discrete obstructing left ureteral mass on this noncontrast study. Indeterminate dense 6 mm nodular focus within a left upper renal calyx. Urology consultation advised. Hematuria protocol CT of the abdomen and pelvis without and with IV contrast is recommended for further evaluation on a short-term basis, to evaluate for a left upper tract urothelial neoplasm. 2. 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 3. **An incidental finding of potential clinical significance has been found. Right lower lobe 1.2 cm solid pulmonary nodule, primary bronchogenic carcinoma not excluded. Consider one of the following in 3 months for both low-risk and high-risk individuals: (a) repeat chest CT, (b) follow-up PET-CT, or (c) tissue sampling. This recommendation follows the consensus statement: Guidelines for Management of Incidental Pulmonary Nodules Detected on CT Images: From the Fleischner Society 2017; Radiology 2017; 284:228-243. ** 4. Diffuse irregular nodular thickening of both adrenal glands, nonspecific.  Differential includes adrenal hyperplasia or multiple small adrenal nodules. PET-CT would also be useful to assess the adrenal glands. 5. Suggestion of diffuse irregular liver surface, cannot exclude cirrhosis. Consider hepatic elastography for further liver fibrosis risk stratification, as clinically warranted. 6. Aortic atherosclerosis. Ectatic infrarenal abdominal aorta, maximum diameter 2.6 cm. Ectatic abdominal aorta at risk for aneurysm development. Recommend followup by ultrasound in 5 years. This recommendation follows ACR consensus guidelines: White Paper of the ACR Incidental Findings Committee II on Vascular Findings. J Am Coll Radiol 2013; 10:789-794. 7. Additional findings include coronary atherosclerosis, small hiatal hernia and severe sigmoid diverticulosis.

## 2016-08-28 ENCOUNTER — Telehealth: Payer: Self-pay | Admitting: Family Medicine

## 2016-08-28 ENCOUNTER — Encounter: Payer: Self-pay | Admitting: Family Medicine

## 2016-08-28 DIAGNOSIS — R319 Hematuria, unspecified: Secondary | ICD-10-CM | POA: Insufficient documentation

## 2016-08-28 DIAGNOSIS — R911 Solitary pulmonary nodule: Secondary | ICD-10-CM | POA: Insufficient documentation

## 2016-08-28 LAB — URINE CULTURE: Culture: NO GROWTH

## 2016-08-28 NOTE — Telephone Encounter (Signed)
Please call patient. Make sure that he has urology follow-up about his recent episode of blood in urine and also about the pulmonary nodule that was noted incidentally on the CAT scan. He should already know about all of the findings but I wanted to make sure that he has follow-up scheduled. If he needs a referral then please let me know. Thanks.

## 2016-08-29 NOTE — Telephone Encounter (Signed)
Patient returned Lugene's call.  Patient can be reached at [phone].

## 2016-08-29 NOTE — Telephone Encounter (Signed)
Left message on patient's voicemail to return call

## 2016-08-30 NOTE — Telephone Encounter (Signed)
Spoke with patient.  Patient does have an appointment with Urology to follow up blood in the urine.  Patient requests that we hold off on the pulmonary nodule follow up statiing "let's do one thing at a time, I've had that pulmonary nodule for years".

## 2016-08-30 NOTE — Telephone Encounter (Signed)
Urology may address the nodule.  If not, then let me know.  Thanks.

## 2016-08-30 NOTE — Telephone Encounter (Signed)
Patient advised.

## 2016-09-09 ENCOUNTER — Encounter: Payer: Self-pay | Admitting: Urology

## 2016-09-09 ENCOUNTER — Ambulatory Visit (INDEPENDENT_AMBULATORY_CARE_PROVIDER_SITE_OTHER): Payer: Medicare Other | Admitting: Urology

## 2016-09-09 VITALS — BP 145/77 | HR 56 | Ht 64.0 in | Wt 151.0 lb

## 2016-09-09 DIAGNOSIS — R310 Gross hematuria: Secondary | ICD-10-CM | POA: Diagnosis not present

## 2016-09-09 NOTE — Addendum Note (Signed)
Addended by: Wilson Singer on: 09/09/2016 04:32 PM   Modules accepted: Orders

## 2016-09-09 NOTE — Progress Notes (Signed)
09/09/2016 4:01 PM   Edwin Moran Nov 01, 1940 [id number]  Referring provider: Tonia Ghent, MD [address] [place], [place] [address]  Chief Complaint  Patient presents with  . hematuria    HPI: Edwin Moran is a 75yo seen for gross hematuria. He had an episode of gross painless hematuria 2 weeks ago which has not recurred. He was started on plavix for PVD several months ago. His Creatinine is elevated a 1.8 from a baseline of 1.  He has nocturia 1-3x which bothers him. No daytime frequency. NO urgency, no dysuria.  He had a CT stone study which showed left mild hydro to the level of the pelvis and left upper pole mass. He has an extensive tobacco abuse history.    PMH: Past Medical History:  Diagnosis Date  . Diverticulosis of colon   . History of GI diverticular bleed 03/20-03/31/04   [facility]  . Hyperlipidemia   . Hypertension   . Osteoarthritis   . Renal insufficiency   . Smoking     Surgical History: Past Surgical History:  Procedure Laterality Date  . CYSTOSCOPY  1991   due to prolonged prostatitis  . 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 PERIPHERAL VASCULAR CATHETERIZATION Right 06/20/2016   Procedure: Lower Extremity Angiography;  Surgeon: Algernon Huxley, MD;  Location: [facility];  Service: Cardiovascular;  Laterality: Right;    Home Medications:    Medication List       Accurate as of 09/09/16  4:01 PM. Always use your most recent med list.          albuterol 108 (90 Base) MCG/ACT inhaler Commonly known as:  PROVENTIL HFA;VENTOLIN HFA Inhale 1-2 puffs into the lungs every 6 (six) hours as needed for wheezing or shortness of breath.   amLODipine 10 MG tablet Commonly known as:  NORVASC TAKE 1 TABLET (10 MG TOTAL) BY MOUTH AT BEDTIME.   aspirin EC 81 MG tablet Take 1 tablet (81 mg total) by mouth daily.   atorvastatin 20 MG tablet Commonly known as:   LIPITOR Take 1 tablet (20 mg total) by mouth daily.   bisoprolol-hydrochlorothiazide 5-6.25 MG tablet Commonly known as:  ZIAC Take 1 tablet by mouth daily.   budesonide-formoterol 160-4.5 MCG/ACT inhaler Commonly known as:  SYMBICORT Inhale 2 puffs into the lungs 2 (two) times daily.   cholecalciferol 1000 units tablet Commonly known as:  VITAMIN D Take 1,000 Units by mouth daily.   clopidogrel 75 MG tablet Commonly known as:  PLAVIX Take 1 tablet (75 mg total) by mouth daily.   GLUCOSAMINE CHONDROITIN COMPLX PO Take 2 tablets by mouth daily.   quinapril 40 MG tablet Commonly known as:  ACCUPRIL TAKE 1 TABLET (40 MG TOTAL) BY MOUTH DAILY.   vitamin B-12 1000 MCG tablet Commonly known as:  CYANOCOBALAMIN Take 1,000 mcg by mouth daily.       Allergies:  Allergies  Allergen Reactions  . Cefprozil     REACTION: Swelling  . Penicillins     REACTION: swelling  . Potassium Chloride     REACTION: SOB    Family History: Family History  Problem Relation Age of Onset  . Asthma Mother     chronic, asthma attack caused MI  . Heart disease Mother     MI  . Thrombophlebitis Mother   . Hiatal hernia Mother   . Prostate cancer Neg Hx   . Colon cancer Neg  Hx     Social History:  reports that he has quit smoking. He has a 40.00 pack-year smoking history. He has never used smokeless tobacco. He reports that he drinks about 8.4 oz of alcohol per week . He reports that he does not use drugs.  ROS: UROLOGY Frequent Urination?: No Hard to postpone urination?: No Burning/pain with urination?: No Get up at night to urinate?: No Leakage of urine?: Yes Urine stream starts and stops?: No Trouble starting stream?: No Do you have to strain to urinate?: No Blood in urine?: Yes Urinary tract infection?: No Sexually transmitted disease?: No Injury to kidneys or bladder?: No Painful intercourse?: No Weak stream?: No Erection problems?: No Penile pain?:  No  Gastrointestinal Nausea?: No Vomiting?: No Indigestion/heartburn?: No Diarrhea?: No Constipation?: No  Constitutional Fever: No Night sweats?: No Weight loss?: No Fatigue?: No  Skin Skin rash/lesions?: Yes Itching?: No  Eyes Blurred vision?: No Double vision?: No  Ears/Nose/Throat Sore throat?: No Sinus problems?: No  Hematologic/Lymphatic Swollen glands?: No Easy bruising?: No  Cardiovascular Leg swelling?: No Chest pain?: No  Respiratory Cough?: No Shortness of breath?: Yes  Endocrine Excessive thirst?: No  Musculoskeletal Back pain?: No Joint pain?: No  Neurological Headaches?: No Dizziness?: No  Psychologic Depression?: No Anxiety?: No  Physical Exam: BP (!) 145/77   Pulse (!) 56   Ht '5\' 4"'$  (1.626 m)   Wt 68.5 kg (151 lb)   BMI 25.92 kg/m   Constitutional:  Alert and oriented, No acute distress. HEENT: [place] AT, moist mucus membranes.  Trachea midline, no masses. Cardiovascular: No clubbing, cyanosis, or edema. Respiratory: Normal respiratory effort, no increased work of breathing. GI: Abdomen is soft, nontender, nondistended, no abdominal masses GU: No CVA tenderness.  Circumcised phallus. No masses/lesion on penis, testis, scrotum. Prostate 60g smooth, no nodules, no induration Skin: No rashes, bruises or suspicious lesions. Lymph: No cervical or inguinal adenopathy. Neurologic: Grossly intact, no focal deficits, moving all 4 extremities. Psychiatric: Normal mood and affect.  Laboratory Data: Lab Results  Component Value Date   WBC 8.8 08/27/2016   HGB 15.3 08/27/2016   HCT 43.9 08/27/2016   MCV 94.6 08/27/2016   PLT 225 08/27/2016    Lab Results  Component Value Date   CREATININE 1.79 (H) 08/27/2016    Lab Results  Component Value Date   PSA 1.34 03/10/2011   PSA 1.23 02/11/2010   PSA 0.88 07/31/2008    No results found for: TESTOSTERONE  No results found for: HGBA1C  Urinalysis    Component Value Date/Time    COLORURINE YELLOW (A) 08/27/2016 0845   APPEARANCEUR CLEAR (A) 08/27/2016 0845   LABSPEC 1.011 08/27/2016 0845   PHURINE 5.0 08/27/2016 0845   GLUCOSEU NEGATIVE 08/27/2016 0845   HGBUR 3+ (A) 08/27/2016 0845   BILIRUBINUR NEGATIVE 08/27/2016 0845   KETONESUR NEGATIVE 08/27/2016 0845   PROTEINUR 30 (A) 08/27/2016 0845   NITRITE NEGATIVE 08/27/2016 0845   LEUKOCYTESUR NEGATIVE 08/27/2016 0845    Pertinent Imaging: CT stone  Assessment & Plan:   1. Gross hematuria -BMP today, if normal will proceed with CT hematuria, if elevated will proceed with left ureteroscopy and retrograde.   2. Nocturia: -we discussed fluid management, timed voiding and medical therapy and the pt wishes to try fluid management   There are no diagnoses linked to this encounter.  No Follow-up on file.  Nicolette Bang, MD  [facility] [address], [place], [place] [address] [phone]

## 2016-09-10 LAB — BASIC METABOLIC PANEL
BUN/Creatinine Ratio: 20 (ref 10–24)
BUN: 38 mg/dL — ABNORMAL HIGH (ref 8–27)
CALCIUM: 9.4 mg/dL (ref 8.6–10.2)
CHLORIDE: 97 mmol/L (ref 96–106)
CO2: 24 mmol/L (ref 18–29)
Creatinine, Ser: 1.88 mg/dL — ABNORMAL HIGH (ref 0.76–1.27)
GFR calc Af Amer: 40 mL/min/{1.73_m2} — ABNORMAL LOW (ref >59)
GFR calc non Af Amer: 34 mL/min/{1.73_m2} — ABNORMAL LOW (ref >59)
GLUCOSE: 82 mg/dL (ref 65–99)
POTASSIUM: 3.8 mmol/L (ref 3.5–5.2)
Sodium: 140 mmol/L (ref 134–144)

## 2016-09-16 ENCOUNTER — Ambulatory Visit
Admission: RE | Admit: 2016-09-16 | Discharge: 2016-09-16 | Disposition: A | Discharge status: Home / Self Care | Payer: Medicare Other | Source: Ambulatory Visit | Attending: Urology | Admitting: Urology

## 2016-09-16 ENCOUNTER — Telehealth: Payer: Self-pay

## 2016-09-16 DIAGNOSIS — R310 Gross hematuria: Secondary | ICD-10-CM | POA: Insufficient documentation

## 2016-09-16 LAB — POCT I-STAT CREATININE: Creatinine, Ser: 2.4 mg/dL — ABNORMAL HIGH (ref 0.61–1.24)

## 2016-09-16 NOTE — Telephone Encounter (Signed)
I received a phone call from Radiology Dept  concerning his CT scan they would not do the scan because of his elevated  Creatinine level. Please advise.

## 2016-09-19 NOTE — Telephone Encounter (Signed)
Spoke w/ Dr. Alyson Ingles and he informed me that I change the order to a CT w/o Contrast, bc of his creatinine level.

## 2016-09-26 ENCOUNTER — Other Ambulatory Visit: Payer: Self-pay

## 2016-09-26 ENCOUNTER — Ambulatory Visit
Admission: RE | Admit: 2016-09-26 | Discharge: 2016-09-26 | Disposition: A | Discharge status: Home / Self Care | Payer: Medicare Other | Source: Ambulatory Visit | Attending: Urology | Admitting: Urology

## 2016-09-26 ENCOUNTER — Other Ambulatory Visit: Payer: Self-pay | Admitting: Urology

## 2016-09-26 DIAGNOSIS — R911 Solitary pulmonary nodule: Secondary | ICD-10-CM | POA: Insufficient documentation

## 2016-09-26 DIAGNOSIS — R310 Gross hematuria: Secondary | ICD-10-CM | POA: Diagnosis not present

## 2016-09-26 DIAGNOSIS — R93422 Abnormal radiologic findings on diagnostic imaging of left kidney: Secondary | ICD-10-CM | POA: Insufficient documentation

## 2016-09-27 ENCOUNTER — Encounter: Payer: Self-pay | Admitting: Urology

## 2016-09-27 ENCOUNTER — Encounter: Payer: Self-pay | Admitting: *Deleted

## 2016-09-27 ENCOUNTER — Ambulatory Visit (INDEPENDENT_AMBULATORY_CARE_PROVIDER_SITE_OTHER): Payer: Medicare Other | Admitting: Urology

## 2016-09-27 VITALS — BP 98/59 | HR 68 | Ht 64.0 in | Wt 171.0 lb

## 2016-09-27 DIAGNOSIS — R310 Gross hematuria: Secondary | ICD-10-CM

## 2016-09-27 DIAGNOSIS — N183 Chronic kidney disease, stage 3 unspecified: Secondary | ICD-10-CM

## 2016-09-27 DIAGNOSIS — N1330 Unspecified hydronephrosis: Secondary | ICD-10-CM | POA: Insufficient documentation

## 2016-09-27 DIAGNOSIS — Q620 Congenital hydronephrosis: Secondary | ICD-10-CM

## 2016-09-27 DIAGNOSIS — Q6211 Congenital occlusion of ureteropelvic junction: Secondary | ICD-10-CM

## 2016-09-27 LAB — URINALYSIS, COMPLETE
Bilirubin, UA: NEGATIVE
Glucose, UA: NEGATIVE
Ketones, UA: NEGATIVE
LEUKOCYTES UA: NEGATIVE
Nitrite, UA: NEGATIVE
PH UA: 5.5 (ref 5.0–7.5)
PROTEIN UA: NEGATIVE
Specific Gravity, UA: 1.015 (ref 1.005–1.030)
Urobilinogen, Ur: 0.2 mg/dL (ref 0.2–1.0)

## 2016-09-27 LAB — MICROSCOPIC EXAMINATION
BACTERIA UA: NONE SEEN
EPITHELIAL CELLS (NON RENAL): NONE SEEN /HPF (ref 0–10)

## 2016-09-27 NOTE — Progress Notes (Signed)
09/27/2016 8:21 AM   Edwin Moran 1941/04/20 [id number]  Referring provider: Tonia Ghent, MD [address] [place], [place] [address]  CC: Follow UP Gross hematuria, left hydronephrosis, CKD3/4  HPI:  1. Gross hematuria - new gross hematuria 07/2016. CT x 2 with left hydro and ? Left distal ureteral mass. No IV contrast given poor GFR. Prior 40PY smoker. He does have some scattered pulm nodules as well on right.   2. Hydronephrosis with ureteropelvic junction (UPJ) obstruction  - left moderate hydro to distal ureter by imaging x several 2017. ? Soft tissue density left distal ureter. Also some borderline peri-aortic lymphadenopathy near kidney. Appears 1 artery 1 vein (with lumbar below level of artery) left renovascular anatomy.  3. Chronic kidney disease, stage 3 - Cr 1.7-1.9 x many. Known PAD, HTN as wel as some left renal obstruction.  Today "Edwin Moran" is seen in f/u above. Unfortunatley he has persistant left hdro and Cr still does not allow IV contrast. He will need OR exam.  PMH sig or PAD / RLE Stent, Plavix (has come off), COPD (former smoker, no O2).  PMH: Past Medical History:  Diagnosis Date  . Diverticulosis of colon   . History of GI diverticular bleed 03/20-03/31/04   [facility]  . Hyperlipidemia   . Hypertension   . Osteoarthritis   . Renal insufficiency   . Smoking     Surgical History: Past Surgical History:  Procedure Laterality Date  . CYSTOSCOPY  1991   due to prolonged prostatitis  . 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 PERIPHERAL VASCULAR CATHETERIZATION Right 06/20/2016   Procedure: Lower Extremity Angiography;  Surgeon: Algernon Huxley, MD;  Location: [facility];  Service: Cardiovascular;  Laterality: Right;    Home Medications:    Medication List       Accurate as of 09/27/16  8:21 AM. Always use your most recent med list.          albuterol 108 (90 Base)  MCG/ACT inhaler Commonly known as:  PROVENTIL HFA;VENTOLIN HFA Inhale 1-2 puffs into the lungs every 6 (six) hours as needed for wheezing or shortness of breath.   amLODipine 10 MG tablet Commonly known as:  NORVASC TAKE 1 TABLET (10 MG TOTAL) BY MOUTH AT BEDTIME.   aspirin EC 81 MG tablet Take 1 tablet (81 mg total) by mouth daily.   atorvastatin 20 MG tablet Commonly known as:  LIPITOR Take 1 tablet (20 mg total) by mouth daily.   bisoprolol-hydrochlorothiazide 5-6.25 MG tablet Commonly known as:  ZIAC Take 1 tablet by mouth daily.   budesonide-formoterol 160-4.5 MCG/ACT inhaler Commonly known as:  SYMBICORT Inhale 2 puffs into the lungs 2 (two) times daily.   cholecalciferol 1000 units tablet Commonly known as:  VITAMIN D Take 1,000 Units by mouth daily.   clopidogrel 75 MG tablet Commonly known as:  PLAVIX Take 1 tablet (75 mg total) by mouth daily.   GLUCOSAMINE CHONDROITIN COMPLX PO Take 2 tablets by mouth daily.   quinapril 40 MG tablet Commonly known as:  ACCUPRIL TAKE 1 TABLET (40 MG TOTAL) BY MOUTH DAILY.   vitamin B-12 1000 MCG tablet Commonly known as:  CYANOCOBALAMIN Take 1,000 mcg by mouth daily.       Allergies:  Allergies  Allergen Reactions  . Cefprozil     REACTION: Swelling  . Penicillins     REACTION: swelling  . Potassium Chloride  REACTION: SOB    Family History: Family History  Problem Relation Age of Onset  . Asthma Mother     chronic, asthma attack caused MI  . Heart disease Mother     MI  . Thrombophlebitis Mother   . Hiatal hernia Mother   . Prostate cancer Neg Hx   . Colon cancer Neg Hx     Social History:  reports that he has quit smoking. He has a 40.00 pack-year smoking history. He has never used smokeless tobacco. He reports that he drinks about 8.4 oz of alcohol per week . He reports that he does not use drugs.    Review of Systems  Gastrointestinal (upper)  : Negative for upper GI  symptoms  Gastrointestinal (lower) : Negative for lower GI symptoms  Constitutional : Negative for symptoms  Skin: Negative for skin symptoms  Eyes: Negative for eye symptoms  Ear/Nose/Throat : Negative for Ear/Nose/Throat symptoms  Hematologic/Lymphatic: Negative for Hematologic/Lymphatic symptoms  Cardiovascular : Negative for cardiovascular symptoms  Respiratory : Negative for respiratory symptoms  Endocrine: Negative for endocrine symptoms  Musculoskeletal: Negative for musculoskeletal symptoms  Neurological: Negative for neurological symptoms  Psychologic: Negative for psychiatric symptoms   Physical Exam: There were no vitals taken for this visit.  Constitutional:  Alert and oriented, No acute distress. HEENT:  AT, moist mucus membranes.  Trachea midline, no masses. Cardiovascular: No clubbing, cyanosis, or edema. Respiratory: Normal respiratory effort, no increased work of breathing. GI: Abdomen is soft, nontender, nondistended, no abdominal masses GU: No CVA tenderness.  Skin: No rashes, bruises or suspicious lesions. Lymph: No cervical or inguinal adenopathy. Neurologic: Grossly intact, no focal deficits, moving all 4 extremities. Psychiatric: Normal mood and affect.  Laboratory Data: Lab Results  Component Value Date   WBC 8.8 08/27/2016   HGB 15.3 08/27/2016   HCT 43.9 08/27/2016   MCV 94.6 08/27/2016   PLT 225 08/27/2016    Lab Results  Component Value Date   CREATININE 2.40 (H) 09/16/2016    Lab Results  Component Value Date   PSA 1.34 03/10/2011   PSA 1.23 02/11/2010   PSA 0.88 07/31/2008    No results found for: TESTOSTERONE  No results found for: HGBA1C  Urinalysis    Component Value Date/Time   COLORURINE YELLOW (A) 08/27/2016 0845   APPEARANCEUR CLEAR (A) 08/27/2016 0845   LABSPEC 1.011 08/27/2016 0845   PHURINE 5.0 08/27/2016 0845   GLUCOSEU NEGATIVE 08/27/2016 0845   HGBUR 3+ (A) 08/27/2016 0845   BILIRUBINUR  NEGATIVE 08/27/2016 0845   KETONESUR NEGATIVE 08/27/2016 0845   PROTEINUR 30 (A) 08/27/2016 0845   NITRITE NEGATIVE 08/27/2016 0845   LEUKOCYTESUR NEGATIVE 08/27/2016 0845    Pertinent Imaging:  as per HPI  Assessment & Plan:    1. Gross hematuria - higly worriseom for urothelial carcinoma. Rec OR next avail for cysto, bilateral retrogrades, left diagnostic ureteroscopy / possible biopsy / possible stent. Hold plavix prior. Risks, benefits, expected peri-op course discussed in detail.   2. Hydronephrosis with ureteropelvic junction (UPJ) obstruction - plan as per above.   3. Chronic kidney disease, stage 3 - likely combination underlying medical renal disease + some left sided partial obstruction.    Alexis Frock, [facility] [address] [place],  [address] [phone]

## 2016-09-28 ENCOUNTER — Telehealth: Payer: Self-pay | Admitting: Radiology

## 2016-09-28 ENCOUNTER — Other Ambulatory Visit: Payer: Self-pay | Admitting: Radiology

## 2016-09-28 DIAGNOSIS — N1330 Unspecified hydronephrosis: Secondary | ICD-10-CM

## 2016-09-28 NOTE — Telephone Encounter (Signed)
Notified pt of surgery scheduled with Dr Erlene Quan on 10/10/16, pre-admit testing appt on 09/30/16 '@1'$ :45 & to call Friday prior to surgery for arrival time to SDS. Advised pt to hold ASA '81mg'$  & Plavix x 7 days prior to surgery beginning on 10/03/16 per clearance from Dr Lucky Cowboy. Pt voices understanding.

## 2016-09-30 ENCOUNTER — Encounter
Admission: RE | Admit: 2016-09-30 | Discharge: 2016-09-30 | Disposition: A | Discharge status: Home / Self Care | Payer: Medicare Other | Source: Ambulatory Visit | Attending: Urology | Admitting: Urology

## 2016-09-30 DIAGNOSIS — Z9889 Other specified postprocedural states: Secondary | ICD-10-CM | POA: Insufficient documentation

## 2016-09-30 DIAGNOSIS — Z95828 Presence of other vascular implants and grafts: Secondary | ICD-10-CM | POA: Insufficient documentation

## 2016-09-30 DIAGNOSIS — Z01818 Encounter for other preprocedural examination: Secondary | ICD-10-CM | POA: Insufficient documentation

## 2016-09-30 HISTORY — DX: Adverse effect of unspecified anesthetic, initial encounter: T41.45XA

## 2016-09-30 HISTORY — DX: Gastro-esophageal reflux disease without esophagitis: K21.9

## 2016-09-30 HISTORY — DX: Nausea with vomiting, unspecified: R11.2

## 2016-09-30 HISTORY — DX: Chronic obstructive pulmonary disease, unspecified: J44.9

## 2016-09-30 HISTORY — DX: Peripheral vascular disease, unspecified: I73.9

## 2016-09-30 HISTORY — DX: Other specified postprocedural states: Z98.890

## 2016-09-30 HISTORY — DX: Personal history of other diseases of the digestive system: Z87.19

## 2016-09-30 HISTORY — DX: Malignant (primary) neoplasm, unspecified: C80.1

## 2016-09-30 NOTE — Patient Instructions (Signed)
  Your procedure is scheduled DH:RCBULA Dec. 11 , 2017. Report to Same Day Surgery. To find out your arrival time please call [phone] between 1PM - 3PM on Friday Dec.8, 2017.  Remember: Instructions that are not followed completely may result in serious medical risk, up to and including death, or upon the discretion of your surgeon and anesthesiologist your surgery may need to be rescheduled.    _x___ 1. Do not eat food or drink liquids after midnight. No gum chewing or hard candies.     _x__ 2. No Alcohol for 24 hours before or after surgery.   ____ 3. Bring all medications with you on the day of surgery if instructed.    __x__ 4. Notify your doctor if there is any change in your medical condition     (cold, fever, infections).    _____ 5. No smoking 24 hours prior to surgery.     Do not wear jewelry, make-up, hairpins, clips or nail polish.  Do not wear lotions, powders, or perfumes.   Do not shave 48 hours prior to surgery. Men may shave face and neck.  Do not bring valuables to the hospital.    [facility] is not responsible for any belongings or valuables.               Contacts, dentures or bridgework may not be worn into surgery.  Leave your suitcase in the car. After surgery it may be brought to your room.  For patients admitted to the hospital, discharge time is determined by your treatment team.   Patients discharged the day of surgery will not be allowed to drive home.    Please read over the following fact sheets that you were given:   [facility] Preparing for Surgery  __x__ Take these medicines the morning of surgery with A SIP OF WATER:    1. quinapril (ACCUPRIL)    ____ Fleet Enema (as directed)   ____ Use CHG Soap as directed on instruction sheet  _x__ Use both inhalers on the day of surgery and bring rescue inhaler to hospital day of surgery  ____ Stop metformin 2 days prior to surgery    ____ Take 1/2 of usual insulin dose the night before  surgery and none on the morning of surgery.   _x___ Stop Plavix & aspirin on Dec. 4, 2017 per instructed by Dr. Lucky Cowboy.  _x___ Stop Anti-inflammatories such as Advil, Aleve, Ibuprofen, Motrin, Naproxen, Naprosyn, Goodies powders or aspirin products. OK to take Tylenol.   _x___ Stop supplements: GLUCOSAMINE CHONDROITIN  until after surgery.    ____ Bring C-Pap to the hospital.

## 2016-09-30 NOTE — Pre-Procedure Instructions (Signed)
Clearance from Dr. Lucky Cowboy on chart.  EKG in epic on 07/06/16 NSR.  Met B and CBC in Epic.

## 2016-10-03 ENCOUNTER — Telehealth: Payer: Self-pay | Admitting: Family Medicine

## 2016-10-03 NOTE — Telephone Encounter (Signed)
Patient Name: Edwin Moran DOB: 28-Jun-1941 Initial Comment Caller states having hard time breathing, he has COPD, has an inhaler, not helping Nurse Assessment Nurse: Robby Sermon, RN, April Date/Time (Eastern Time): 10/03/2016 8:27:08 AM Confirm and document reason for call. If symptomatic, describe symptoms. ---Caller states he has Cymbicort and a rescue inhaler. He walks 15 feet and is out of breath for the past week. He states he quit smoking in September and he had a stint placed in his leg. He has a non productive cough. Does the patient have any new or worsening symptoms? ---Yes Will a triage be completed? ---Yes Related visit to physician within the last 2 weeks? ---No Does the PT have any chronic conditions? (i.e. diabetes, asthma, etc.) ---Yes List chronic conditions. ---COPD, hypertension, blood thinners Is this a behavioral health or substance abuse call? ---No Guidelines Guideline Title Affirmed Question Affirmed Notes COPD Oxygen Monitoring and Hypoxia [1] MILD difficulty breathing (e.g., minimal/no SOB at rest, SOB with walking) AND [2] worse than normal Final Disposition User See Physician within [place], RN, April Comments APPT SCHEDULED with Dr. Damita Dunnings for tomorrow 12/5 at 1015 Referrals REFERRED TO PCP OFFICE Disagree/Comply: Comply

## 2016-10-03 NOTE — Telephone Encounter (Signed)
If worse, needs eval today.  Thanks.

## 2016-10-03 NOTE — Telephone Encounter (Signed)
Patient has been scheduled with Dr. Damita Dunnings tomorrow 10/04/16 at 1015.  He is to call back if symptoms worsen of fever elevates per team health triage instructions.

## 2016-10-03 NOTE — Telephone Encounter (Signed)
Patient states he is no worse than he has been for the past 2 or 3 days.  Patient states that tomorrow is fine.

## 2016-10-04 ENCOUNTER — Ambulatory Visit (INDEPENDENT_AMBULATORY_CARE_PROVIDER_SITE_OTHER)
Admission: RE | Admit: 2016-10-04 | Discharge: 2016-10-04 | Disposition: A | Discharge status: Home / Self Care | Payer: Medicare Other | Source: Ambulatory Visit | Attending: Family Medicine | Admitting: Family Medicine

## 2016-10-04 ENCOUNTER — Ambulatory Visit (INDEPENDENT_AMBULATORY_CARE_PROVIDER_SITE_OTHER): Payer: Medicare Other | Admitting: Family Medicine

## 2016-10-04 ENCOUNTER — Encounter: Payer: Self-pay | Admitting: Family Medicine

## 2016-10-04 VITALS — BP 102/66 | HR 70 | Temp 96.4°F | Ht 64.0 in | Wt 171.5 lb

## 2016-10-04 DIAGNOSIS — J441 Chronic obstructive pulmonary disease with (acute) exacerbation: Secondary | ICD-10-CM

## 2016-10-04 MED ORDER — PREDNISONE 10 MG PO TABS: ORAL_TABLET | ORAL | 0 refills | Status: DC

## 2016-10-04 NOTE — Progress Notes (Signed)
Subjective:    Patient ID: Edwin Moran, male    DOB: Nov 25, 1940, 75 y.o.   MRN: [id number]  HPI  75 yo pt of Dr Josefine Class with hx of copd here with sob and back pain  Also hx of vascular dz  Former smoker- quit smoking 3 mo ago   (ever since then "everythings gone to crap!"  He is baseline on symbicort 2 puffs bid (160-4.5) Has rescue albuterol mdi (6-7 times per day)   A little worried about pneumonia since he has pain across his upper back  Dry hacky cough  Wheezes very easily  No fever  Is fatigued   Some runny nose  No sinus pain  No purulent nasal drainage  Denies st or ear pain  He does not feel sick   Says if he is at rest-no problems  Gets sob on exertion - about 12 feet   Pulse ox is 90 % on RA after walking down the hall   He is holding his plavix due to upcoming urological procedure on Monday   Pt has had relatively recent cardiac w/u with nl EF on echo  Patient Active Problem List   Diagnosis Date Noted  . COPD exacerbation ([place]) 10/04/2016  . Hydronephrosis, Left 09/27/2016  . Chronic kidney disease, stage 3 09/27/2016  . Hematuria 08/28/2016  . Pulmonary nodule 08/28/2016  . PAD (peripheral artery disease) ([place]) 06/14/2016  . Advance care planning 10/07/2014  . SOB (shortness of breath) on exertion 01/02/2014  . Medicare annual wellness visit, subsequent 03/21/2013  . ARTHRITIS, CARPOMETACARPAL JOINT, BILATERAL 05/28/2009  . DISORDER, TOBACCO USE 05/11/2007  . Essential hypertension 05/11/2007  . ANEMIA, VITAMIN B12 DEFICIENCY 05/10/2007  . DIVERTICULOSIS, COLON 05/10/2007  . OSTEOARTHRITIS 05/10/2007   Past Medical History:  Diagnosis Date  . Cancer ([place])    skin  . Complication of anesthesia 1963   pt woke up during the procedure choking on drainage.  Marland Kitchen COPD (chronic obstructive pulmonary disease) ([place])   . Diverticulosis of colon   . GERD (gastroesophageal reflux disease)   . History of GI diverticular bleed 03/20-03/31/04   [facility]  .  History of hiatal hernia   . Hyperlipidemia   . Hypertension   . Osteoarthritis   . Peripheral vascular disease ([place])    right foot tunred purole  . PONV (postoperative nausea and vomiting)    at age 89 with finger surgery.  . Renal insufficiency   . Smoking    Past Surgical History:  Procedure Laterality Date  . COLONOSCOPY    . CYSTOSCOPY  1991   due to prolonged prostatitis  . 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 PERIPHERAL VASCULAR CATHETERIZATION Right 06/20/2016   Procedure: Lower Extremity Angiography;  Surgeon: Algernon Huxley, MD;  Location: [facility];  Service: Cardiovascular;  Laterality: Right;  . WISDOM TOOTH EXTRACTION     Social History  Substance Use Topics  . Smoking status: Former Smoker    Packs/day: 1.00    Years: 40.00    Quit date: 07/31/2016  . Smokeless tobacco: Never Used     Comment: off and on for 40 years, 1 PPD at first  . Alcohol use 12.6 - 16.8 oz/week    14 Standard drinks or equivalent, 7 - 14 Shots of liquor per week     Comment: couple of drinks a day   Family History  Problem Relation Age of Onset  . Asthma  Mother     chronic, asthma attack caused MI  . Heart disease Mother     MI  . Thrombophlebitis Mother   . Hiatal hernia Mother   . Prostate cancer Neg Hx   . Colon cancer Neg Hx    Allergies  Allergen Reactions  . Cefprozil     REACTION: Swelling  . Penicillins Swelling    Has patient had a PCN reaction causing immediate rash, facial/tongue/throat swelling, SOB or lightheadedness with hypotension: Yes Has patient had a PCN reaction causing severe rash involving mucus membranes or skin necrosis: No Has patient had a PCN reaction that required hospitalization No Has patient had a PCN reaction occurring within the last 10 years: No If all of the above answers are "NO", then may proceed with Cephalosporin use.   Marland Kitchen Potassium Chloride     REACTION:  SOB   Current Outpatient Prescriptions on File Prior to Visit  Medication Sig Dispense Refill  . albuterol (PROVENTIL HFA;VENTOLIN HFA) 108 (90 Base) MCG/ACT inhaler Inhale 1-2 puffs into the lungs every 6 (six) hours as needed for wheezing or shortness of breath. 1 Inhaler 12  . amLODipine (NORVASC) 10 MG tablet TAKE 1 TABLET (10 MG TOTAL) BY MOUTH AT BEDTIME. 90 tablet 3  . aspirin EC 81 MG tablet Take 1 tablet (81 mg total) by mouth daily. 150 tablet 2  . atorvastatin (LIPITOR) 20 MG tablet Take 1 tablet (20 mg total) by mouth daily. 30 tablet 5  . bisoprolol-hydrochlorothiazide (ZIAC) 5-6.25 MG tablet Take 1 tablet by mouth daily. 90 tablet 3  . budesonide-formoterol (SYMBICORT) 160-4.5 MCG/ACT inhaler Inhale 2 puffs into the lungs 2 (two) times daily. 3 Inhaler 3  . cholecalciferol (VITAMIN D) 1000 UNITS tablet Take 1,000 Units by mouth daily.    . clopidogrel (PLAVIX) 75 MG tablet Take 1 tablet (75 mg total) by mouth daily. (Patient taking differently: Take 75 mg by mouth daily at 12 noon. ) 30 tablet 11  . GLUCOSAMINE CHONDROITIN COMPLX PO Take 2 tablets by mouth daily.     . quinapril (ACCUPRIL) 40 MG tablet TAKE 1 TABLET (40 MG TOTAL) BY MOUTH DAILY. 90 tablet 3  . vitamin B-12 (CYANOCOBALAMIN) 1000 MCG tablet Take 1,000 mcg by mouth daily.       No current facility-administered medications on file prior to visit.     Review of Systems Review of Systems  Constitutional: Negative for fever, appetite change,  and unexpected weight change. pos for fatigue Eyes: Negative for pain and visual disturbance.  ENT pos for mild rhinorrhea and pnd  Respiratory: pos for cough and shortness of breath.  pos for wheezing (neg for phlegm) Cardiovascular: Negative for cp or palpitations    Gastrointestinal: Negative for nausea, diarrhea and constipation.  Genitourinary: Negative for urgency and frequency.  Skin: Negative for pallor or rash  MSK pos for pain across upper chest with cough    Neurological: Negative for weakness, light-headedness, numbness and headaches.  Hematological: Negative for adenopathy. Does not bruise/bleed easily.  Psychiatric/Behavioral: Negative for dysphoric mood. The patient is not nervous/anxious.         Objective:   Physical Exam  Constitutional: He appears well-developed and well-nourished.  overwt and well appearing   HENT:  Head: Normocephalic and atraumatic.  Mouth/Throat: Oropharynx is clear and moist.  Nares are boggy with scant clear rhinorrhea   Eyes: Conjunctivae and EOM are normal. Pupils are equal, round, and reactive to light. Right eye exhibits no discharge. Left eye exhibits  no discharge.  Neck: Normal range of motion. Neck supple.  Cardiovascular: Normal rate, regular rhythm and normal heart sounds.   Pulmonary/Chest: No respiratory distress. He has wheezes. He has no rales. He exhibits no tenderness.  Sob on exertion /not at rest  Diffusely distant bs  Harsh bs throughout w/o rales/rhonchi/crackles Wheeze at bases on forced expiration  Mildly prolonged exp phase  Lymphadenopathy:    He has no cervical adenopathy.  Neurological: He is alert.  Skin: Skin is warm and dry. No rash noted. He is not diaphoretic. No erythema. No pallor.  Psychiatric: He has a normal mood and affect.          Assessment & Plan:   Problem List Items Addressed This Visit      Respiratory   COPD exacerbation ([place])    Sob on exertion with inc wheezing in pt with 60 pack year hx /recently quit  Some upper back pain cxr now  Prednisone taper 30 mg -disc poss side eff Albuterol mri prn  Continue the symbicort    ADDENDUM:  CXR :   IMPRESSION: 1. Mild cardiomegaly. Mild bilateral from interstitial prominence consist with interstitial edema and/or pneumonitis. Small bilateral pleural effusions.  2. Previously identified small stable pulmonary nodule in the right lung base is not identified due to overlying acute  interstitial process.  Rev with his PCP and will plan to ref to pulmonary for further eval and tx of copd       Relevant Medications   predniSONE (DELTASONE) 10 MG tablet   Other Relevant Orders   DG Chest 2 View (Completed)

## 2016-10-04 NOTE — Assessment & Plan Note (Addendum)
Sob on exertion with inc wheezing in pt with 60 pack year hx /recently quit  Some upper back pain cxr now  Prednisone taper 30 mg -disc poss side eff Albuterol mri prn  Continue the symbicort    ADDENDUM:  CXR :   IMPRESSION: 1. Mild cardiomegaly. Mild bilateral from interstitial prominence consist with interstitial edema and/or pneumonitis. Small bilateral pleural effusions.  2. Previously identified small stable pulmonary nodule in the right lung base is not identified due to overlying acute interstitial process.  Rev with his PCP and will plan to ref to pulmonary for further eval and tx of copd

## 2016-10-04 NOTE — Patient Instructions (Addendum)
Take the prednisone as directed  Make you drink enough water  Continue your current inhalers  Chest xray now - we will contact you with result   In the meantime if your symptoms suddenly worsen please alert [place]  Update if not starting to improve in a week or if worsening

## 2016-10-04 NOTE — Progress Notes (Signed)
Pre visit review using our clinic review tool, if applicable. No additional management support is needed unless otherwise documented below in the visit note. 

## 2016-10-05 ENCOUNTER — Other Ambulatory Visit: Payer: Self-pay | Admitting: Family Medicine

## 2016-10-05 DIAGNOSIS — R9389 Abnormal findings on diagnostic imaging of other specified body structures: Secondary | ICD-10-CM

## 2016-10-09 MED ORDER — CIPROFLOXACIN IN D5W 200 MG/100ML IV SOLN
200.0000 mg | INTRAVENOUS | Status: AC
  Administered 2016-10-10: 200 mg via INTRAVENOUS
  Filled 2016-10-09: qty 100

## 2016-10-10 ENCOUNTER — Ambulatory Visit: Payer: Medicare Other | Admitting: Anesthesiology

## 2016-10-10 ENCOUNTER — Ambulatory Visit
Admission: RE | Admit: 2016-10-10 | Discharge: 2016-10-10 | Disposition: A | Discharge status: Home / Self Care | Payer: Medicare Other | Source: Ambulatory Visit | Attending: Urology | Admitting: Urology

## 2016-10-10 ENCOUNTER — Encounter
Admission: RE | Disposition: A | Discharge status: Home / Self Care | Payer: Self-pay | Source: Ambulatory Visit | Attending: Urology

## 2016-10-10 DIAGNOSIS — Z7902 Long term (current) use of antithrombotics/antiplatelets: Secondary | ICD-10-CM | POA: Diagnosis not present

## 2016-10-10 DIAGNOSIS — Z992 Dependence on renal dialysis: Secondary | ICD-10-CM | POA: Diagnosis not present

## 2016-10-10 DIAGNOSIS — N184 Chronic kidney disease, stage 4 (severe): Secondary | ICD-10-CM | POA: Insufficient documentation

## 2016-10-10 DIAGNOSIS — Z8249 Family history of ischemic heart disease and other diseases of the circulatory system: Secondary | ICD-10-CM | POA: Insufficient documentation

## 2016-10-10 DIAGNOSIS — Z825 Family history of asthma and other chronic lower respiratory diseases: Secondary | ICD-10-CM | POA: Insufficient documentation

## 2016-10-10 DIAGNOSIS — I739 Peripheral vascular disease, unspecified: Secondary | ICD-10-CM | POA: Diagnosis not present

## 2016-10-10 DIAGNOSIS — Z87891 Personal history of nicotine dependence: Secondary | ICD-10-CM | POA: Diagnosis not present

## 2016-10-10 DIAGNOSIS — Z89022 Acquired absence of left finger(s): Secondary | ICD-10-CM | POA: Diagnosis not present

## 2016-10-10 DIAGNOSIS — Z7982 Long term (current) use of aspirin: Secondary | ICD-10-CM | POA: Diagnosis not present

## 2016-10-10 DIAGNOSIS — N179 Acute kidney failure, unspecified: Secondary | ICD-10-CM

## 2016-10-10 DIAGNOSIS — J441 Chronic obstructive pulmonary disease with (acute) exacerbation: Secondary | ICD-10-CM | POA: Diagnosis not present

## 2016-10-10 DIAGNOSIS — M1990 Unspecified osteoarthritis, unspecified site: Secondary | ICD-10-CM | POA: Insufficient documentation

## 2016-10-10 DIAGNOSIS — N135 Crossing vessel and stricture of ureter without hydronephrosis: Secondary | ICD-10-CM | POA: Insufficient documentation

## 2016-10-10 DIAGNOSIS — Z79899 Other long term (current) drug therapy: Secondary | ICD-10-CM | POA: Diagnosis not present

## 2016-10-10 DIAGNOSIS — N132 Hydronephrosis with renal and ureteral calculous obstruction: Secondary | ICD-10-CM | POA: Diagnosis not present

## 2016-10-10 DIAGNOSIS — R310 Gross hematuria: Secondary | ICD-10-CM

## 2016-10-10 DIAGNOSIS — K449 Diaphragmatic hernia without obstruction or gangrene: Secondary | ICD-10-CM | POA: Diagnosis not present

## 2016-10-10 DIAGNOSIS — N1330 Unspecified hydronephrosis: Secondary | ICD-10-CM | POA: Diagnosis not present

## 2016-10-10 HISTORY — PX: URETEROSCOPY: SHX842

## 2016-10-10 HISTORY — PX: CYSTOSCOPY WITH STENT PLACEMENT: SHX5790

## 2016-10-10 HISTORY — PX: CYSTOSCOPY WITH BIOPSY: SHX5122

## 2016-10-10 HISTORY — PX: CYSTOSCOPY W/ RETROGRADES: SHX1426

## 2016-10-10 SURGERY — CYSTOSCOPY, WITH RETROGRADE PYELOGRAM
Anesthesia: Spinal

## 2016-10-10 MED ORDER — PHENYLEPHRINE HCL 10 MG/ML IJ SOLN
INTRAMUSCULAR | Status: DC | PRN
  Administered 2016-10-10 (×4): 100 ug via INTRAVENOUS

## 2016-10-10 MED ORDER — OXYCODONE HCL 5 MG PO TABS: 5.0000 mg | ORAL_TABLET | Freq: Once | ORAL | Status: DC | PRN

## 2016-10-10 MED ORDER — MEPERIDINE HCL 25 MG/ML IJ SOLN: 6.2500 mg | INTRAMUSCULAR | Status: DC | PRN

## 2016-10-10 MED ORDER — SODIUM CHLORIDE 0.9 % IV SOLN
INTRAVENOUS | Status: DC
  Administered 2016-10-10 (×3): via INTRAVENOUS

## 2016-10-10 MED ORDER — LIDOCAINE HCL (CARDIAC) 20 MG/ML IV SOLN
INTRAVENOUS | Status: DC | PRN
  Administered 2016-10-10: 50 mg via INTRAVENOUS

## 2016-10-10 MED ORDER — PROMETHAZINE HCL 25 MG/ML IJ SOLN: 6.2500 mg | INTRAMUSCULAR | Status: DC | PRN

## 2016-10-10 MED ORDER — DOCUSATE SODIUM 100 MG PO CAPS: 100.0000 mg | ORAL_CAPSULE | Freq: Two times a day (BID) | ORAL | 0 refills | Status: AC

## 2016-10-10 MED ORDER — FENTANYL CITRATE (PF) 100 MCG/2ML IJ SOLN
INTRAMUSCULAR | Status: DC | PRN
  Administered 2016-10-10 (×2): 50 ug via INTRAVENOUS

## 2016-10-10 MED ORDER — IOTHALAMATE MEGLUMINE 43 % IV SOLN
INTRAVENOUS | Status: DC | PRN
  Administered 2016-10-10: 50 mL

## 2016-10-10 MED ORDER — EPHEDRINE SULFATE 50 MG/ML IJ SOLN
INTRAMUSCULAR | Status: DC | PRN
  Administered 2016-10-10: 10 mg via INTRAVENOUS

## 2016-10-10 MED ORDER — FENTANYL CITRATE (PF) 100 MCG/2ML IJ SOLN: 25.0000 ug | INTRAMUSCULAR | Status: DC | PRN

## 2016-10-10 MED ORDER — FAMOTIDINE 20 MG PO TABS
20.0000 mg | ORAL_TABLET | Freq: Once | ORAL | Status: AC
  Administered 2016-10-10: 20 mg via ORAL

## 2016-10-10 MED ORDER — HYDROCODONE-ACETAMINOPHEN 5-325 MG PO TABS: 1.0000 | ORAL_TABLET | Freq: Four times a day (QID) | ORAL | 0 refills | Status: AC | PRN

## 2016-10-10 MED ORDER — BUPIVACAINE HCL (PF) 0.5 % IJ SOLN
INTRAMUSCULAR | Status: DC | PRN
  Administered 2016-10-10: 3 mL

## 2016-10-10 MED ORDER — PROPOFOL 10 MG/ML IV BOLUS
INTRAVENOUS | Status: DC | PRN
  Administered 2016-10-10: 30 mg via INTRAVENOUS

## 2016-10-10 MED ORDER — OXYBUTYNIN CHLORIDE 5 MG PO TABS: 5.0000 mg | ORAL_TABLET | Freq: Three times a day (TID) | ORAL | 0 refills | Status: AC | PRN

## 2016-10-10 MED ORDER — PROPOFOL 500 MG/50ML IV EMUL
INTRAVENOUS | Status: DC | PRN
  Administered 2016-10-10: 50 ug/kg/min via INTRAVENOUS

## 2016-10-10 MED ORDER — OXYCODONE HCL 5 MG/5ML PO SOLN: 5.0000 mg | Freq: Once | ORAL | Status: DC | PRN

## 2016-10-10 SURGICAL SUPPLY — 46 items
ADVANTAGE CATHETER ×5 IMPLANT
BAG DRAIN CYSTO-URO LG1000N (MISCELLANEOUS) ×5 IMPLANT
BASKET 3WIRE GEMINI 2.4X120 (MISCELLANEOUS) ×5 IMPLANT
BRUSH CYTOLOGY 3FR 115CML (MISCELLANEOUS) ×1
BRUSH CYTOLOGY 3FR 115L (MISCELLANEOUS) ×4 IMPLANT
BRUSH SCRUB EZ 1% IODOPHOR (MISCELLANEOUS) ×5 IMPLANT
BSKT STON RTRVL GEM 120 2.4F (MISCELLANEOUS) ×1
CATH BEACON 5.038 65CM KMP-01 (CATHETERS) ×5 IMPLANT
CATH URETL 5X70 OPEN END (CATHETERS) ×5 IMPLANT
CNTNR SPEC 2.5X3XGRAD LEK (MISCELLANEOUS) ×3
CONRAY 43 FOR UROLOGY 50M (MISCELLANEOUS) ×5 IMPLANT
CONT SPEC 4OZ STER OR WHT (MISCELLANEOUS) ×2
CONT SPEC 4OZ STRL OR WHT (MISCELLANEOUS) ×1
CONTAINER SPEC 2.5X3XGRAD LEK (MISCELLANEOUS) ×3 IMPLANT
DRAPE UTILITY 15X26 TOWEL STRL (DRAPES) ×5 IMPLANT
DRESSING TELFA 4X3 1S ST N-ADH (GAUZE/BANDAGES/DRESSINGS) ×5 IMPLANT
ELECT REM PT RETURN 9FT ADLT (ELECTROSURGICAL)
ELECTRODE REM PT RTRN 9FT ADLT (ELECTROSURGICAL) IMPLANT
FORCEPS BIOP PIRANHA Y (CUTTING FORCEPS) ×5 IMPLANT
GLIDEWIRE STIFF .35X180X3 HYDR (WIRE) ×5 IMPLANT
GLOVE BIO SURGEON STRL SZ 6.5 (GLOVE) ×4 IMPLANT
GLOVE BIO SURGEONS STRL SZ 6.5 (GLOVE) ×1
GOWN STRL REUS W/ TWL LRG LVL3 (GOWN DISPOSABLE) ×9 IMPLANT
GOWN STRL REUS W/TWL LRG LVL3 (GOWN DISPOSABLE) ×9
GUIDEWIRE SUPER STIFF (WIRE) ×5 IMPLANT
INFUSOR MANOMETER BAG 3000ML (MISCELLANEOUS) ×5 IMPLANT
INTRODUCER DILATOR DOUBLE (INTRODUCER) IMPLANT
KIT RM TURNOVER CYSTO AR (KITS) ×5 IMPLANT
NDL SAFETY ECLIPSE 18X1.5 (NEEDLE) ×3 IMPLANT
NEEDLE HYPO 18GX1.5 SHARP (NEEDLE) ×2
PACK CYSTO AR (MISCELLANEOUS) ×5 IMPLANT
SCRUB POVIDONE IODINE 4 OZ (MISCELLANEOUS) ×5 IMPLANT
SENSORWIRE 0.038 NOT ANGLED (WIRE) ×10
SET CYSTO W/LG BORE CLAMP LF (SET/KITS/TRAYS/PACK) ×5 IMPLANT
SHEATH URETERAL 12FRX35CM (MISCELLANEOUS) ×5 IMPLANT
SOL .9 NS 3000ML IRR  AL (IV SOLUTION) ×4
SOL .9 NS 3000ML IRR UROMATIC (IV SOLUTION) ×6 IMPLANT
STENT URET 6FRX24 CONTOUR (STENTS) IMPLANT
STENT URET 6FRX26 CONTOUR (STENTS) IMPLANT
STENT URO INLAY 6FRX26CM (STENTS) ×5 IMPLANT
SURGILUBE 2OZ TUBE FLIPTOP (MISCELLANEOUS) ×5 IMPLANT
SYR 30ML LL (SYRINGE) ×5 IMPLANT
SYRINGE IRR TOOMEY STRL 70CC (SYRINGE) ×5 IMPLANT
WATER STERILE IRR 1000ML POUR (IV SOLUTION) ×5 IMPLANT
WATER STERILE IRR 3000ML UROMA (IV SOLUTION) IMPLANT
WIRE SENSOR 0.038 NOT ANGLED (WIRE) ×6 IMPLANT

## 2016-10-10 NOTE — Anesthesia Procedure Notes (Addendum)
Spinal  Patient location during procedure: OR Start time: 10/10/2016 11:49 AM End time: 10/10/2016 11:55 AM Staffing Anesthesiologist: Emmie Niemann Performed: anesthesiologist  Preanesthetic Checklist Completed: patient identified, site marked, surgical consent, pre-op evaluation, timeout performed, IV checked, risks and benefits discussed and monitors and equipment checked Spinal Block Patient position: sitting Prep: ChloraPrep Patient monitoring: heart rate, continuous pulse ox, blood pressure and cardiac monitor Approach: midline Location: L4-5 Injection technique: single-shot Needle Needle type: Whitacre and Introducer  Needle gauge: 24 G Needle length: 9 cm Additional Notes Negative paresthesia. Negative blood return. Positive free-flowing CSF. Expiration date of kit checked and confirmed. Patient tolerated procedure well, without complications.

## 2016-10-10 NOTE — OR Nursing (Signed)
Daughter instructed on foley care and will call for an appointment for removal of foley in 2 days

## 2016-10-10 NOTE — Transfer of Care (Signed)
Immediate Anesthesia Transfer of Care Note  Patient: Edwin Moran  Procedure(s) Performed: Procedure(s): CYSTOSCOPY WITH RETROGRADE PYELOGRAM (Bilateral) URETEROSCOPY (Left) CYSTOSCOPY WITH BIOPSY (N/A) CYSTOSCOPY WITH STENT PLACEMENT (Left)  Patient Location: PACU  Anesthesia Type:Spinal  Level of Consciousness: awake, alert  and oriented  Airway & Oxygen Therapy: Patient Spontanous Breathing and Patient connected to nasal cannula oxygen  Post-op Assessment: Report given to RN and Post -op Vital signs reviewed and stable  Post vital signs: Reviewed and stable  Last Vitals:  Vitals:   10/10/16 0933  BP: 127/64  Pulse: 63  Resp: 18  Temp: 36.8 C    Last Pain:  Vitals:   10/10/16 0933  TempSrc: Oral         Complications: No apparent anesthesia complications

## 2016-10-10 NOTE — Interval H&P Note (Signed)
History and Physical Interval Note:  10/10/2016 11:22 AM  Edwin Moran  has presented today for surgery, with the diagnosis of LEFT HYDRONEPHROSIS  The various methods of treatment have been discussed with the patient and family. After consideration of risks, benefits and other options for treatment, the patient has consented to  Procedure(s): CYSTOSCOPY WITH RETROGRADE PYELOGRAM (Bilateral) URETEROSCOPY (Left) CYSTOSCOPY WITH BIOPSY (N/A) CYSTOSCOPY WITH STENT PLACEMENT (Left) as a surgical intervention .  The patient's history has been reviewed, patient examined, no change in status, stable for surgery.  I have reviewed the patient's chart and labs.  Questions were answered to the patient's satisfaction.    RRR CTAB  Currently experiencing COPD exacerbation, will precede today with spinal. Discussed with anesthesia.    Hollice Espy

## 2016-10-10 NOTE — OR Nursing (Signed)
1840 Foley Cath inserted and 400cc of red urine returned.

## 2016-10-10 NOTE — OR Nursing (Signed)
[place] Dr. Kelton Pillar here.  Explained to patient that she was concerned he may not be able to urinate once he is at home.  Ordered a foley to drainage.  Return to office for removal in 2 days.

## 2016-10-10 NOTE — Anesthesia Preprocedure Evaluation (Signed)
Anesthesia Evaluation  Patient identified by MRN, date of birth, ID band Patient awake    Reviewed: Allergy & Precautions, NPO status , Patient's Chart, lab work & pertinent test results  History of Anesthesia Complications (+) PONV and history of anesthetic complications  Airway Mallampati: II  TM Distance: >3 FB Neck ROM: Full    Dental no notable dental hx.    Pulmonary neg sleep apnea, COPD (recent exacerbation, finishing steroids, increased productive cough),  COPD inhaler, former smoker,    breath sounds clear to auscultation- rhonchi (-) wheezing      Cardiovascular hypertension, Pt. on medications + Peripheral Vascular Disease  (-) CAD and (-) Past MI  Rhythm:Regular Rate:Normal - Systolic murmurs and - Diastolic murmurs    Neuro/Psych negative neurological ROS  negative psych ROS   GI/Hepatic Neg liver ROS, hiatal hernia, GERD  ,  Endo/Other  negative endocrine ROSneg diabetes  Renal/GU ARFRenal disease     Musculoskeletal  (+) Arthritis ,   Abdominal (+) - obese,   Peds  Hematology  (+) anemia ,   Anesthesia Other Findings Past Medical History: No date: Cancer ([place])     Comment: skin 1245: Complication of anesthesia     Comment: pt woke up during the procedure choking on               drainage. No date: COPD (chronic obstructive pulmonary disease) (* No date: Diverticulosis of colon No date: GERD (gastroesophageal reflux disease) 03/20-03/31/04: History of GI diverticular bleed     Comment: [facility] No date: History of hiatal hernia No date: Hyperlipidemia No date: Hypertension No date: Osteoarthritis No date: Peripheral vascular disease ([facility])     Comment: right foot tunred purole No date: PONV (postoperative nausea and vomiting)     Comment: at age 19 with finger surgery. No date: Renal insufficiency No date: Smoking   Reproductive/Obstetrics                              Anesthesia Physical Anesthesia Plan  ASA: IV  Anesthesia Plan: Spinal   Post-op Pain Management:    Induction:   Airway Management Planned: Natural Airway  Additional Equipment:   Intra-op Plan:   Post-operative Plan:   Informed Consent: I have reviewed the patients History and Physical, chart, labs and discussed the procedure including the risks, benefits and alternatives for the proposed anesthesia with the patient or authorized representative who has indicated his/her understanding and acceptance.   Dental advisory given  Plan Discussed with: CRNA and Anesthesiologist  Anesthesia Plan Comments: (Discussed increased risk of respiratory complications given COPD exacerbation, however urologist is concerned that if we don't place stent today that renal function will progress to failure and this will need to be done emergently, does not feel that it would be safe to wait for COPD exacerbation to resolve. Will proceed with spinal anesthesia to prevent need for airway manipulation. Patient stopped plavix 7 days ago.)        Lab Results  Component Value Date   WBC 8.8 08/27/2016   HGB 15.3 08/27/2016   HCT 43.9 08/27/2016   MCV 94.6 08/27/2016   PLT 225 08/27/2016    Anesthesia Quick Evaluation

## 2016-10-10 NOTE — Op Note (Signed)
Date of procedure: 10/10/16  Preoperative diagnosis:  1. Gross hematuria 2. Left hydroureteronephrosis 3. Acute kidney failure  Postoperative diagnosis:  1. same   Procedure: 1. Cystoscopy 2. Bilateral retrograde pyelogram 3. Left ureteroscopy 4. Left ureteral stent placement  Surgeon: Hollice Espy, MD  Anesthesia: General  Complications: None  Intraoperative findings: Near complete obstruction of left distal ureter, possible extrinsic compression versus high-grade nodular tumor.    EBL: Minimal  Specimens: Left ureteral cytology, left distal ureteral biopsy  Drains: 6 x 26 French double-J ureteral stent, Bard Optima on left  Indication: Edwin Moran is a 75 y.o. patient with episode of gross hematuria found to have multiple concerning findings on noncontrast CT abdomen pelvis including pulmonary nodules, periaortic lymph nodes, moderate left hydroureteronephrosis with concern for distal ureteral obstruction.  After reviewing the management options for treatment, he elected to proceed with the above surgical procedure(s). We have discussed the potential benefits and risks of the procedure, side effects of the proposed treatment, the likelihood of the patient achieving the goals of the procedure, and any potential problems that might occur during the procedure or recuperation. Informed consent has been obtained.  Unfortunate, today he has a productive cough and is being treated for COPD exacerbation. As such, he opted to have a spinal anesthetic.  Description of procedure:  The patient was taken to the operating room and spinal anesthesia was administed.  The patient was placed in the dorsal lithotomy position, prepped and draped in the usual sterile fashion, and preoperative antibiotics were administered. A preoperative time-out was performed.   A 21 French cystoscope was advanced per urethra into the bladder. Of note, his prostate was somewhat enlarged and elevated bladder  neck. His bladder was probably trabeculated. The trigone was in normal anatomic position. There was some nonspecific erythema involving the bladder wall but no other tumors, masses, or lesions within the bladder itself. Attention was turned to the right ureteral orifice which was cannulated using a 5 [place] open-ended ureteral catheter just within the UO. Some contrast was injected but did not progress past the very distal ureter secondary to J hooking of the distal ureter. A sensor wire was then placed up to level of the kidney and the open-ended ureteral catheter was advanced up to the proximal ureter where gentle retrograde pyelogram was performed. This revealed a decompressed collecting system without filling defects. There was fairly significant pyelosinus backflow or extravasation of unclear etiology on the side which was somewhat unusual. The open-ended ureteral catheter was then backed down the length of the ureter within the distal ureter and additional contrast was injected. There was no obvious obstructions or filling defects although there was areas of dilation and narrowing throughout the entire length of the mid and distal ureter. Several additional delayed films were obtained and all of the contrast drained adequately from the side with no residual retroperitoneal contrast therefore the previous finding was likely representative pyelosinus backflow.    Next, attention was turned to the left ureteral orifice. It was cannulated using a 5 [place] open-ended ureteral catheter. Contrast was injected and which could be seen within the distal ureter but no additional contrast at all whatsoever above this point consistent with nearly complete obstruction on the side. I then attempted to advance a sensor wire without success. I tried several other maneuvers and including using an angled Glidewire and a Kumpe catheter to help traverse the angulation but was ultimately unsuccessful. In attempts to save the  patient from needing a nephrostomy  tube, I did go ahead and advance a semirigid ureteroscope just within the distal ureter and under direct visualization where it tiny lumen was identified, a sensor wire was ultimately able to be advanced up to the kidney. The Kumpe catheter was then advanced over the sensor wire up to the level of the kidney and the wire was removed. Retropyelogram on the side showed moderate hydronephrosis without any obvious filling defects although again significant presumed pyelosinus backflow was appreciated. This made the image somewhat less clear than normal therefore interpretation was somewhat difficult. The wire was then exchanged for a sensor wire. This was snapped in place as a safety wire. A semirigid ureteroscope was then advanced alongside the wire to the area of obstruction where significant coaptation of the ureter was appreciated, it was somewhat hypovascular in appearance and subtly nodular although this could've been from extrinsic compression of the ureter. The ureter did not distend as normally. The area of abnormality was over proximally 1-2 cm in length just distal to the iliacs. I used a cytology brush as well as parotid forceps to biopsy the ureteral mucosa at the site. Again, no papillary fronds were identified and there is a somewhat nodular appearance but no obvious tumor with in the ureter itself.  Finally, a second Super Stiff wire was advanced up to the kidney. I then attempted to advance a flexible scope to evaluate the upper tract but was unable to advance the scope beyond the area of abnormality within the distal ureter.  Finally, the safety wire was backloaded over a rigid cystoscope. A 6 x 26 French double-J ureteral stent specifically a Bard Optima, was used given the unclear pathology at this point. The wire was partially drawn until full coil was noted within the upper calyx. The wire was then fully withdrawn and a full coil was noted within the bladder. The  bladder was then drained. The patient was then awakened from anesthesia, taken to the PACU in stable condition.  Plan: We'll bring the patient back to the office next week and follow-up the pathology results. I suspect these will be inconclusive based on the findings today intraoperatively. We will recheck his labs and if his creatinine has normalized, we'll proceed with contrasted study which will help elucidate possible etiology. He may need a second look ureteroscopy after ureteral dilation additionally to evaluate the upper tract. We'll also plan to obtain a urine cytology, voided in the office.  Hollice Espy, M.D.

## 2016-10-10 NOTE — Discharge Instructions (Signed)
You have a ureteral stent in place.  This is a tube that extends from your kidney to your bladder.  This may cause urinary bleeding, burning with urination, and urinary frequency.  Please call our office or present to the ED if you develop fevers >101 or pain which is not able to be controlled with oral pain medications.  You may be given either Flomax and/ or ditropan to help with bladder spasms and stent pain in addition to pain medications.   ° °[facility] °[address], [address] °[place], [place] [address] °[phone] ° ° ° °AMBULATORY SURGERY  °DISCHARGE INSTRUCTIONS ° ° °1) The drugs that you were given will stay in your system until tomorrow so for the next 24 hours you should not: ° °A) Drive an automobile °B) Make any legal decisions °C) Drink any alcoholic beverage ° ° °2) You may resume regular meals tomorrow.  Today it is better to start with liquids and gradually work up to solid foods. ° °You may eat anything you prefer, but it is better to start with liquids, then soup and crackers, and gradually work up to solid foods. ° ° °3) Please notify your doctor immediately if you have any unusual bleeding, trouble breathing, redness and pain at the surgery site, drainage, fever, or pain not relieved by medication. ° ° ° °4) Additional Instructions: ° ° ° ° ° ° ° °Please contact your physician with any problems or Same Day Surgery at [phone], Monday through Friday 6 am to 4 pm, or [place] at [place] Main number at [phone]. °

## 2016-10-10 NOTE — H&P (View-Only) (Signed)
09/27/2016 8:21 AM   Edwin Moran [birth year] [id number]  Referring provider: Tonia Ghent, MD [address] [place], [place] [address]  CC: Follow UP Gross hematuria, left hydronephrosis, CKD3/4  HPI:  1. Gross hematuria - new gross hematuria 07/2016. CT x 2 with left hydro and ? Left distal ureteral mass. No IV contrast given poor GFR. Prior 40PY smoker. He does have some scattered pulm nodules as well on right.   2. Hydronephrosis with ureteropelvic junction (UPJ) obstruction  - left moderate hydro to distal ureter by imaging x several 2017. ? Soft tissue density left distal ureter. Also some borderline peri-aortic lymphadenopathy near kidney. Appears 1 artery 1 vein (with lumbar below level of artery) left renovascular anatomy.  3. Chronic kidney disease, stage 3 - Cr 1.7-1.9 x many. Known PAD, HTN as wel as some left renal obstruction.  Today "Edwin Moran" is seen in f/u above. Unfortunatley he has persistant left hdro and Cr still does not allow IV contrast. He will need OR exam.  PMH sig or PAD / RLE Stent, Plavix (has come off), COPD (former smoker, no O2).  PMH: Past Medical History:  Diagnosis Date  . Diverticulosis of colon   . History of GI diverticular bleed 03/20-03/31/04   [facility]  . Hyperlipidemia   . Hypertension   . Osteoarthritis   . Renal insufficiency   . Smoking     Surgical History: Past Surgical History:  Procedure Laterality Date  . CYSTOSCOPY  1991   due to prolonged prostatitis  . 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 PERIPHERAL VASCULAR CATHETERIZATION Right 06/20/2016   Procedure: Lower Extremity Angiography;  Surgeon: Algernon Huxley, MD;  Location: [facility];  Service: Cardiovascular;  Laterality: Right;    Home Medications:    Medication List       Accurate as of 09/27/16  8:21 AM. Always use your most recent med list.          albuterol 108 (90 Base)  MCG/ACT inhaler Commonly known as:  PROVENTIL HFA;VENTOLIN HFA Inhale 1-2 puffs into the lungs every 6 (six) hours as needed for wheezing or shortness of breath.   amLODipine 10 MG tablet Commonly known as:  NORVASC TAKE 1 TABLET (10 MG TOTAL) BY MOUTH AT BEDTIME.   aspirin EC 81 MG tablet Take 1 tablet (81 mg total) by mouth daily.   atorvastatin 20 MG tablet Commonly known as:  LIPITOR Take 1 tablet (20 mg total) by mouth daily.   bisoprolol-hydrochlorothiazide 5-6.25 MG tablet Commonly known as:  ZIAC Take 1 tablet by mouth daily.   budesonide-formoterol 160-4.5 MCG/ACT inhaler Commonly known as:  SYMBICORT Inhale 2 puffs into the lungs 2 (two) times daily.   cholecalciferol 1000 units tablet Commonly known as:  VITAMIN D Take 1,000 Units by mouth daily.   clopidogrel 75 MG tablet Commonly known as:  PLAVIX Take 1 tablet (75 mg total) by mouth daily.   GLUCOSAMINE CHONDROITIN COMPLX PO Take 2 tablets by mouth daily.   quinapril 40 MG tablet Commonly known as:  ACCUPRIL TAKE 1 TABLET (40 MG TOTAL) BY MOUTH DAILY.   vitamin B-12 1000 MCG tablet Commonly known as:  CYANOCOBALAMIN Take 1,000 mcg by mouth daily.       Allergies:  Allergies  Allergen Reactions  . Cefprozil     REACTION: Swelling  . Penicillins     REACTION: swelling  . Potassium Chloride  REACTION: SOB    Family History: Family History  Problem Relation Age of Onset  . Asthma Mother     chronic, asthma attack caused MI  . Heart disease Mother     MI  . Thrombophlebitis Mother   . Hiatal hernia Mother   . Prostate cancer Neg Hx   . Colon cancer Neg Hx     Social History:  reports that he has quit smoking. He has a 40.00 pack-year smoking history. He has never used smokeless tobacco. He reports that he drinks about 8.4 oz of alcohol per week . He reports that he does not use drugs.    Review of Systems  Gastrointestinal (upper)  : Negative for upper GI  symptoms  Gastrointestinal (lower) : Negative for lower GI symptoms  Constitutional : Negative for symptoms  Skin: Negative for skin symptoms  Eyes: Negative for eye symptoms  Ear/Nose/Throat : Negative for Ear/Nose/Throat symptoms  Hematologic/Lymphatic: Negative for Hematologic/Lymphatic symptoms  Cardiovascular : Negative for cardiovascular symptoms  Respiratory : Negative for respiratory symptoms  Endocrine: Negative for endocrine symptoms  Musculoskeletal: Negative for musculoskeletal symptoms  Neurological: Negative for neurological symptoms  Psychologic: Negative for psychiatric symptoms   Physical Exam: There were no vitals taken for this visit.  Constitutional:  Alert and oriented, No acute distress. HEENT: [place] AT, moist mucus membranes.  Trachea midline, no masses. Cardiovascular: No clubbing, cyanosis, or edema. Respiratory: Normal respiratory effort, no increased work of breathing. GI: Abdomen is soft, nontender, nondistended, no abdominal masses GU: No CVA tenderness.  Skin: No rashes, bruises or suspicious lesions. Lymph: No cervical or inguinal adenopathy. Neurologic: Grossly intact, no focal deficits, moving all 4 extremities. Psychiatric: Normal mood and affect.  Laboratory Data: Lab Results  Component Value Date   WBC 8.8 08/27/2016   HGB 15.3 08/27/2016   HCT 43.9 08/27/2016   MCV 94.6 08/27/2016   PLT 225 08/27/2016    Lab Results  Component Value Date   CREATININE 2.40 (H) 09/16/2016    Lab Results  Component Value Date   PSA 1.34 03/10/2011   PSA 1.23 02/11/2010   PSA 0.88 07/31/2008    No results found for: TESTOSTERONE  No results found for: HGBA1C  Urinalysis    Component Value Date/Time   COLORURINE YELLOW (A) 08/27/2016 0845   APPEARANCEUR CLEAR (A) 08/27/2016 0845   LABSPEC 1.011 08/27/2016 0845   PHURINE 5.0 08/27/2016 0845   GLUCOSEU NEGATIVE 08/27/2016 0845   HGBUR 3+ (A) 08/27/2016 0845   BILIRUBINUR  NEGATIVE 08/27/2016 0845   KETONESUR NEGATIVE 08/27/2016 0845   PROTEINUR 30 (A) 08/27/2016 0845   NITRITE NEGATIVE 08/27/2016 0845   LEUKOCYTESUR NEGATIVE 08/27/2016 0845    Pertinent Imaging:  as per HPI  Assessment & Plan:    1. Gross hematuria - higly worriseom for urothelial carcinoma. Rec OR next avail for cysto, bilateral retrogrades, left diagnostic ureteroscopy / possible biopsy / possible stent. Hold plavix prior. Risks, benefits, expected peri-op course discussed in detail.   2. Hydronephrosis with ureteropelvic junction (UPJ) obstruction - plan as per above.   3. Chronic kidney disease, stage 3 - likely combination underlying medical renal disease + some left sided partial obstruction.    Alexis Frock, [facility] [address] [place], [place] [address] [phone]

## 2016-10-10 NOTE — OR Nursing (Signed)
Urinated x 2 about 50 cc each .  No bladder distention noted.  Drinking fluids and fluid bolus of 500cc given

## 2016-10-10 NOTE — Anesthesia Postprocedure Evaluation (Signed)
Anesthesia Post Note  Patient: Edwin Moran  Procedure(s) Performed: Procedure(s) (LRB): CYSTOSCOPY WITH RETROGRADE PYELOGRAM (Bilateral) URETEROSCOPY (Left) CYSTOSCOPY WITH BIOPSY (N/A) CYSTOSCOPY WITH STENT PLACEMENT (Left)  Patient location during evaluation: PACU Anesthesia Type: Spinal Level of consciousness: oriented and awake and alert Pain management: pain level controlled Vital Signs Assessment: post-procedure vital signs reviewed and stable Respiratory status: spontaneous breathing, respiratory function stable and nonlabored ventilation Cardiovascular status: blood pressure returned to baseline and stable Postop Assessment: no headache, no backache and spinal receding Anesthetic complications: no    Last Vitals:  Vitals:   10/10/16 1410 10/10/16 1425  BP: (!) 84/64 91/65  Pulse: 70 64  Resp: (!) 32 (!) 26  Temp:      Last Pain:  Vitals:   10/10/16 0933  TempSrc: Oral                 Ahmod Gillespie

## 2016-10-11 ENCOUNTER — Telehealth: Payer: Self-pay | Admitting: Urology

## 2016-10-11 ENCOUNTER — Telehealth: Payer: Self-pay | Admitting: Family Medicine

## 2016-10-11 ENCOUNTER — Encounter: Payer: Self-pay | Admitting: Urology

## 2016-10-11 LAB — CYTOLOGY - NON PAP

## 2016-10-11 LAB — SURGICAL PATHOLOGY

## 2016-10-11 NOTE — Telephone Encounter (Signed)
Patient is calling and asking how long he will have to keep his catheter in? When his post op appt was made it was only to discuss pathology results in one week. Does he need to have his cath out also?   Sharyn Lull

## 2016-10-11 NOTE — Telephone Encounter (Signed)
Patient called and asked if Dr.Duncan would sign a handicapped placard form for him.  Patient has COPD.  I put form in Dr.Duncan's In box for signature.

## 2016-10-11 NOTE — Telephone Encounter (Signed)
He was unable to void after spinal late yesterday evening and foley was replaced.  Nurse voiding trial tomorrow please.    Hollice Espy, MD

## 2016-10-12 NOTE — Telephone Encounter (Signed)
Patient notified that form is up front ready for pickup. Copy sent to be scanned.

## 2016-10-12 NOTE — Telephone Encounter (Signed)
Signed, thanks

## 2016-10-12 NOTE — Telephone Encounter (Signed)
appt has been made  Edwin Moran

## 2016-10-13 ENCOUNTER — Ambulatory Visit (INDEPENDENT_AMBULATORY_CARE_PROVIDER_SITE_OTHER): Payer: Medicare Other

## 2016-10-13 VITALS — BP 107/68 | HR 120 | Ht 64.0 in | Wt 173.0 lb

## 2016-10-13 DIAGNOSIS — Q6211 Congenital occlusion of ureteropelvic junction: Secondary | ICD-10-CM

## 2016-10-13 DIAGNOSIS — Q620 Congenital hydronephrosis: Secondary | ICD-10-CM

## 2016-10-13 NOTE — Progress Notes (Signed)
Fill and Pull Catheter Removal  Patient is present today for a catheter removal.  Patient was cleaned and prepped in a sterile fashion 158m of sterile water/ saline was instilled into the bladder when the patient felt the urge to urinate. 156mof water was then drained from the balloon.  A 14FR foley cath was removed from the bladder no complications were noted .  Patient as then given some time to void on their own.  Patient can void  903mn their own after some time.  Patient tolerated well.  Preformed by: CheToniann FailPN   Follow up/ Additional notes: Due to gross hematuria reinforced with pt to drink plenty of fluids to flush system of blood. Reinforced with pt if not able to urinate to RTC. Pt voiced understanding.   Blood pressure 107/68, pulse (!) 120, height '5\' 4"'$  (1.626 m), weight 173 lb (78.5 kg).

## 2016-10-17 ENCOUNTER — Other Ambulatory Visit (INDEPENDENT_AMBULATORY_CARE_PROVIDER_SITE_OTHER): Payer: Self-pay | Admitting: Vascular Surgery

## 2016-10-17 DIAGNOSIS — M79661 Pain in right lower leg: Secondary | ICD-10-CM

## 2016-10-17 DIAGNOSIS — I739 Peripheral vascular disease, unspecified: Secondary | ICD-10-CM

## 2016-10-17 DIAGNOSIS — M79662 Pain in left lower leg: Secondary | ICD-10-CM

## 2016-10-18 ENCOUNTER — Ambulatory Visit: Payer: Medicare Other | Admitting: Urology

## 2016-10-20 ENCOUNTER — Inpatient Hospital Stay
Admission: EM | Admit: 2016-10-20 | Discharge: 2016-10-26 | DRG: 180 | Disposition: A | Discharge status: Home Health | Payer: Medicare Other | Attending: Internal Medicine | Admitting: Internal Medicine

## 2016-10-20 ENCOUNTER — Ambulatory Visit (INDEPENDENT_AMBULATORY_CARE_PROVIDER_SITE_OTHER): Payer: Medicare Other | Admitting: Urology

## 2016-10-20 ENCOUNTER — Emergency Department: Payer: Medicare Other

## 2016-10-20 ENCOUNTER — Inpatient Hospital Stay: Payer: Medicare Other

## 2016-10-20 ENCOUNTER — Encounter: Payer: Self-pay | Admitting: Urology

## 2016-10-20 ENCOUNTER — Encounter: Payer: Self-pay | Admitting: *Deleted

## 2016-10-20 VITALS — BP 107/69 | HR 71 | Ht 64.0 in | Wt 166.0 lb

## 2016-10-20 DIAGNOSIS — E039 Hypothyroidism, unspecified: Secondary | ICD-10-CM | POA: Diagnosis present

## 2016-10-20 DIAGNOSIS — R911 Solitary pulmonary nodule: Secondary | ICD-10-CM | POA: Diagnosis not present

## 2016-10-20 DIAGNOSIS — N183 Chronic kidney disease, stage 3 unspecified: Secondary | ICD-10-CM

## 2016-10-20 DIAGNOSIS — J189 Pneumonia, unspecified organism: Secondary | ICD-10-CM | POA: Diagnosis present

## 2016-10-20 DIAGNOSIS — I5031 Acute diastolic (congestive) heart failure: Secondary | ICD-10-CM | POA: Diagnosis present

## 2016-10-20 DIAGNOSIS — Z7982 Long term (current) use of aspirin: Secondary | ICD-10-CM

## 2016-10-20 DIAGNOSIS — J9601 Acute respiratory failure with hypoxia: Secondary | ICD-10-CM | POA: Diagnosis present

## 2016-10-20 DIAGNOSIS — K219 Gastro-esophageal reflux disease without esophagitis: Secondary | ICD-10-CM | POA: Diagnosis present

## 2016-10-20 DIAGNOSIS — I959 Hypotension, unspecified: Secondary | ICD-10-CM | POA: Diagnosis present

## 2016-10-20 DIAGNOSIS — R0902 Hypoxemia: Secondary | ICD-10-CM

## 2016-10-20 DIAGNOSIS — I130 Hypertensive heart and chronic kidney disease with heart failure and stage 1 through stage 4 chronic kidney disease, or unspecified chronic kidney disease: Secondary | ICD-10-CM | POA: Diagnosis present

## 2016-10-20 DIAGNOSIS — Z8249 Family history of ischemic heart disease and other diseases of the circulatory system: Secondary | ICD-10-CM | POA: Diagnosis not present

## 2016-10-20 DIAGNOSIS — Z888 Allergy status to other drugs, medicaments and biological substances status: Secondary | ICD-10-CM

## 2016-10-20 DIAGNOSIS — N2889 Other specified disorders of kidney and ureter: Secondary | ICD-10-CM | POA: Diagnosis not present

## 2016-10-20 DIAGNOSIS — J910 Malignant pleural effusion: Secondary | ICD-10-CM | POA: Diagnosis present

## 2016-10-20 DIAGNOSIS — Z880 Allergy status to penicillin: Secondary | ICD-10-CM | POA: Diagnosis not present

## 2016-10-20 DIAGNOSIS — R0602 Shortness of breath: Secondary | ICD-10-CM

## 2016-10-20 DIAGNOSIS — Z7902 Long term (current) use of antithrombotics/antiplatelets: Secondary | ICD-10-CM | POA: Diagnosis not present

## 2016-10-20 DIAGNOSIS — Z825 Family history of asthma and other chronic lower respiratory diseases: Secondary | ICD-10-CM

## 2016-10-20 DIAGNOSIS — E785 Hyperlipidemia, unspecified: Secondary | ICD-10-CM | POA: Diagnosis present

## 2016-10-20 DIAGNOSIS — I739 Peripheral vascular disease, unspecified: Secondary | ICD-10-CM | POA: Diagnosis present

## 2016-10-20 DIAGNOSIS — N1330 Unspecified hydronephrosis: Secondary | ICD-10-CM | POA: Diagnosis not present

## 2016-10-20 DIAGNOSIS — Z79899 Other long term (current) drug therapy: Secondary | ICD-10-CM

## 2016-10-20 DIAGNOSIS — K7460 Unspecified cirrhosis of liver: Secondary | ICD-10-CM | POA: Diagnosis present

## 2016-10-20 DIAGNOSIS — C799 Secondary malignant neoplasm of unspecified site: Secondary | ICD-10-CM

## 2016-10-20 DIAGNOSIS — Z87891 Personal history of nicotine dependence: Secondary | ICD-10-CM | POA: Diagnosis not present

## 2016-10-20 DIAGNOSIS — J90 Pleural effusion, not elsewhere classified: Secondary | ICD-10-CM | POA: Diagnosis present

## 2016-10-20 DIAGNOSIS — I639 Cerebral infarction, unspecified: Secondary | ICD-10-CM

## 2016-10-20 DIAGNOSIS — Z9889 Other specified postprocedural states: Secondary | ICD-10-CM

## 2016-10-20 DIAGNOSIS — J440 Chronic obstructive pulmonary disease with acute lower respiratory infection: Secondary | ICD-10-CM | POA: Diagnosis present

## 2016-10-20 DIAGNOSIS — R062 Wheezing: Secondary | ICD-10-CM

## 2016-10-20 DIAGNOSIS — J441 Chronic obstructive pulmonary disease with (acute) exacerbation: Secondary | ICD-10-CM | POA: Diagnosis present

## 2016-10-20 DIAGNOSIS — R590 Localized enlarged lymph nodes: Secondary | ICD-10-CM | POA: Diagnosis not present

## 2016-10-20 DIAGNOSIS — C3491 Malignant neoplasm of unspecified part of right bronchus or lung: Secondary | ICD-10-CM | POA: Diagnosis present

## 2016-10-20 DIAGNOSIS — C3490 Malignant neoplasm of unspecified part of unspecified bronchus or lung: Secondary | ICD-10-CM | POA: Diagnosis not present

## 2016-10-20 DIAGNOSIS — R310 Gross hematuria: Secondary | ICD-10-CM | POA: Diagnosis present

## 2016-10-20 DIAGNOSIS — Z7951 Long term (current) use of inhaled steroids: Secondary | ICD-10-CM

## 2016-10-20 LAB — PROTEIN, BODY FLUID: TOTAL PROTEIN, FLUID: 4.3 g/dL

## 2016-10-20 LAB — BASIC METABOLIC PANEL
Anion gap: 10 (ref 5–15)
BUN: 39 mg/dL — ABNORMAL HIGH (ref 6–20)
CALCIUM: 8.6 mg/dL — AB (ref 8.9–10.3)
CO2: 24 mmol/L (ref 22–32)
CREATININE: 2.09 mg/dL — AB (ref 0.61–1.24)
Chloride: 103 mmol/L (ref 101–111)
GFR calc non Af Amer: 30 mL/min — ABNORMAL LOW (ref >60)
GFR, EST AFRICAN AMERICAN: 34 mL/min — AB (ref >60)
Glucose, Bld: 98 mg/dL (ref 65–99)
Potassium: 3.5 mmol/L (ref 3.5–5.1)
SODIUM: 137 mmol/L (ref 135–145)

## 2016-10-20 LAB — GLUCOSE, SEROUS FLUID: GLUCOSE FL: 27 mg/dL

## 2016-10-20 LAB — CBC WITH DIFFERENTIAL/PLATELET
BASOS PCT: 1 %
Basophils Absolute: 0.1 10*3/uL (ref 0–0.1)
EOS ABS: 0.2 10*3/uL (ref 0–0.7)
EOS PCT: 2 %
HCT: 36.6 % — ABNORMAL LOW (ref 40.0–52.0)
Hemoglobin: 12.8 g/dL — ABNORMAL LOW (ref 13.0–18.0)
Lymphocytes Relative: 7 %
Lymphs Abs: 0.6 10*3/uL — ABNORMAL LOW (ref 1.0–3.6)
MCH: 33.6 pg (ref 26.0–34.0)
MCHC: 35 g/dL (ref 32.0–36.0)
MCV: 96.1 fL (ref 80.0–100.0)
MONO ABS: 0.7 10*3/uL (ref 0.2–1.0)
Monocytes Relative: 7 %
Neutro Abs: 8 10*3/uL — ABNORMAL HIGH (ref 1.4–6.5)
Neutrophils Relative %: 83 %
PLATELETS: 255 10*3/uL (ref 150–440)
RBC: 3.8 MIL/uL — ABNORMAL LOW (ref 4.40–5.90)
RDW: 15 % — AB (ref 11.5–14.5)
WBC: 9.6 10*3/uL (ref 3.8–10.6)

## 2016-10-20 LAB — LACTATE DEHYDROGENASE, PLEURAL OR PERITONEAL FLUID: LD, Fluid: 980 U/L — ABNORMAL HIGH (ref 3–23)

## 2016-10-20 LAB — AMYLASE: Amylase: 49 U/L (ref 28–100)

## 2016-10-20 LAB — BODY FLUID CELL COUNT WITH DIFFERENTIAL
Eos, Fluid: 3 %
LYMPHS FL: 65 %
Monocyte-Macrophage-Serous Fluid: 11 %
NEUTROPHIL FLUID: 21 %
Other Cells, Fluid: 0 %
WBC FLUID: 1263 uL

## 2016-10-20 LAB — TROPONIN I

## 2016-10-20 LAB — TSH: TSH: 187.061 u[IU]/mL — ABNORMAL HIGH (ref 0.350–4.500)

## 2016-10-20 LAB — BRAIN NATRIURETIC PEPTIDE: B NATRIURETIC PEPTIDE 5: 93 pg/mL (ref 0.0–100.0)

## 2016-10-20 MED ORDER — AMLODIPINE BESYLATE 5 MG PO TABS
10.0000 mg | ORAL_TABLET | Freq: Every day | ORAL | Status: DC
  Administered 2016-10-20 – 2016-10-21 (×2): 10 mg via ORAL
  Filled 2016-10-20 (×2): qty 2

## 2016-10-20 MED ORDER — ACETAMINOPHEN 650 MG RE SUPP: 650.0000 mg | Freq: Four times a day (QID) | RECTAL | Status: DC | PRN

## 2016-10-20 MED ORDER — SODIUM CHLORIDE 0.9 % IV SOLN
INTRAVENOUS | Status: DC
  Administered 2016-10-20: 1000 mL via INTRAVENOUS
  Administered 2016-10-21 – 2016-10-22 (×3): via INTRAVENOUS

## 2016-10-20 MED ORDER — LISINOPRIL 20 MG PO TABS
40.0000 mg | ORAL_TABLET | Freq: Every day | ORAL | Status: DC
  Administered 2016-10-20 – 2016-10-21 (×2): 40 mg via ORAL
  Filled 2016-10-20 (×2): qty 2

## 2016-10-20 MED ORDER — SODIUM CHLORIDE 0.9% FLUSH
3.0000 mL | Freq: Two times a day (BID) | INTRAVENOUS | Status: DC
  Administered 2016-10-20 – 2016-10-26 (×11): 3 mL via INTRAVENOUS

## 2016-10-20 MED ORDER — CLOPIDOGREL BISULFATE 75 MG PO TABS
75.0000 mg | ORAL_TABLET | Freq: Every day | ORAL | Status: DC
  Administered 2016-10-20 – 2016-10-26 (×7): 75 mg via ORAL
  Filled 2016-10-20 (×7): qty 1

## 2016-10-20 MED ORDER — ENOXAPARIN SODIUM 30 MG/0.3ML ~~LOC~~ SOLN
30.0000 mg | SUBCUTANEOUS | Status: DC
  Administered 2016-10-20: 30 mg via SUBCUTANEOUS
  Filled 2016-10-20: qty 0.3

## 2016-10-20 MED ORDER — OXYBUTYNIN CHLORIDE 5 MG PO TABS: 5.0000 mg | ORAL_TABLET | Freq: Three times a day (TID) | ORAL | Status: DC | PRN

## 2016-10-20 MED ORDER — HYDROCHLOROTHIAZIDE 10 MG/ML ORAL SUSPENSION
6.2500 mg | Freq: Every day | ORAL | Status: DC
  Filled 2016-10-20: qty 1.25

## 2016-10-20 MED ORDER — MOMETASONE FURO-FORMOTEROL FUM 200-5 MCG/ACT IN AERO
2.0000 | INHALATION_SPRAY | Freq: Two times a day (BID) | RESPIRATORY_TRACT | Status: DC
  Administered 2016-10-20 – 2016-10-22 (×4): 2 via RESPIRATORY_TRACT
  Filled 2016-10-20: qty 8.8

## 2016-10-20 MED ORDER — HYDROCODONE-ACETAMINOPHEN 5-325 MG PO TABS: 1.0000 | ORAL_TABLET | ORAL | Status: DC | PRN

## 2016-10-20 MED ORDER — SODIUM CHLORIDE 0.9 % IV BOLUS (SEPSIS)
500.0000 mL | Freq: Once | INTRAVENOUS | Status: AC
  Administered 2016-10-20: 500 mL via INTRAVENOUS

## 2016-10-20 MED ORDER — ATORVASTATIN CALCIUM 20 MG PO TABS
20.0000 mg | ORAL_TABLET | Freq: Every day | ORAL | Status: DC
  Administered 2016-10-20 – 2016-10-25 (×6): 20 mg via ORAL
  Filled 2016-10-20 (×6): qty 1

## 2016-10-20 MED ORDER — VITAMIN B-12 1000 MCG PO TABS
1000.0000 ug | ORAL_TABLET | Freq: Every day | ORAL | Status: DC
  Administered 2016-10-21 – 2016-10-26 (×6): 1000 ug via ORAL
  Filled 2016-10-20 (×6): qty 1

## 2016-10-20 MED ORDER — ASPIRIN EC 81 MG PO TBEC
81.0000 mg | DELAYED_RELEASE_TABLET | Freq: Every day | ORAL | Status: DC
  Administered 2016-10-20 – 2016-10-26 (×7): 81 mg via ORAL
  Filled 2016-10-20 (×7): qty 1

## 2016-10-20 MED ORDER — ALBUTEROL SULFATE (2.5 MG/3ML) 0.083% IN NEBU
2.5000 mg | INHALATION_SOLUTION | Freq: Four times a day (QID) | RESPIRATORY_TRACT | Status: DC | PRN
  Administered 2016-10-22: 2.5 mg via RESPIRATORY_TRACT
  Filled 2016-10-20: qty 3

## 2016-10-20 MED ORDER — ALBUTEROL SULFATE HFA 108 (90 BASE) MCG/ACT IN AERS: 1.0000 | INHALATION_SPRAY | Freq: Four times a day (QID) | RESPIRATORY_TRACT | Status: DC | PRN

## 2016-10-20 MED ORDER — DOCUSATE SODIUM 100 MG PO CAPS
100.0000 mg | ORAL_CAPSULE | Freq: Two times a day (BID) | ORAL | Status: DC
  Administered 2016-10-22 – 2016-10-26 (×8): 100 mg via ORAL
  Filled 2016-10-20 (×12): qty 1

## 2016-10-20 MED ORDER — SENNOSIDES-DOCUSATE SODIUM 8.6-50 MG PO TABS: 1.0000 | ORAL_TABLET | Freq: Every evening | ORAL | Status: DC | PRN

## 2016-10-20 MED ORDER — IOPAMIDOL (ISOVUE-370) INJECTION 76%
75.0000 mL | Freq: Once | INTRAVENOUS | Status: AC | PRN
  Administered 2016-10-20: 75 mL via INTRAVENOUS

## 2016-10-20 MED ORDER — BISOPROLOL-HYDROCHLOROTHIAZIDE 5-6.25 MG PO TABS: 1.0000 | ORAL_TABLET | Freq: Every day | ORAL | Status: DC

## 2016-10-20 MED ORDER — QUINAPRIL HCL 10 MG PO TABS: 40.0000 mg | ORAL_TABLET | Freq: Every day | ORAL | Status: DC

## 2016-10-20 MED ORDER — ACETAMINOPHEN 325 MG PO TABS: 650.0000 mg | ORAL_TABLET | Freq: Four times a day (QID) | ORAL | Status: DC | PRN

## 2016-10-20 MED ORDER — VITAMIN D 1000 UNITS PO TABS
1000.0000 [IU] | ORAL_TABLET | Freq: Every day | ORAL | Status: DC
  Administered 2016-10-21 – 2016-10-26 (×6): 1000 [IU] via ORAL
  Filled 2016-10-20 (×6): qty 1

## 2016-10-20 MED ORDER — BISOPROLOL FUMARATE 5 MG PO TABS
5.0000 mg | ORAL_TABLET | Freq: Every day | ORAL | Status: DC
  Filled 2016-10-20: qty 1

## 2016-10-20 NOTE — Progress Notes (Signed)
Anticoagulation monitoring(Lovenox):  75 yo ordered Lovenox 40 mg Q24h  Filed Weights   10/20/16 1044  Weight: 166 lb (75.3 kg)    Lab Results  Component Value Date   CREATININE 2.09 (H) 10/20/2016   CREATININE 2.40 (H) 09/16/2016   CREATININE 1.88 (H) 09/09/2016   Estimated Creatinine Clearance: 28.8 mL/min (by C-G formula based on SCr of 2.09 mg/dL (H)). Hemoglobin & Hematocrit     Component Value Date/Time   HGB 12.8 (L) 10/20/2016 1046   HCT 36.6 (L) 10/20/2016 1046     Per Protocol for Patient with estCrcl < 30 ml/min and BMI < 40, will transition to Lovenox 30 mg Q24h.  Darylene Price [facility] 10/20/16 4:52 PM

## 2016-10-20 NOTE — ED Notes (Signed)
ED Provider at bedside. 

## 2016-10-20 NOTE — ED Triage Notes (Signed)
Pt was at urology for follow up and o2 sat was 85% on RA, states hx of COPD with recent steroid use, at present pt denies any SOB when sitting, awake and alert

## 2016-10-20 NOTE — ED Notes (Signed)
Patient transported to Ultrasound 

## 2016-10-20 NOTE — H&P (Signed)
[facility] [place] NAME: Edwin Moran    MR#:  [id number]  DATE OF BIRTH:  07-06-1941  DATE OF ADMISSION:  10/20/2016  PRIMARY CARE PHYSICIAN: Elsie Stain, MD   REQUESTING/REFERRING PHYSICIAN: dr Dineen Kid  CHIEF COMPLAINT:   hypoxia HISTORY OF PRESENT ILLNESS:  Edwin Moran  is a 75 y.o. male with a known history of COPD, former smoker who quit 3 months ago and left hydronephrosis status post recent ureteral stent who presented from urologist office with hypoxia. Over the past 2 weeks patient has had increasing shortness of breath, dyspnea exertion and fatigue. He saw his PCP approximate 2 weeks ago and was placed on a steroid taper. He then underwent ureteral stent on December 11. Patient reports since that time he has had the symptoms. He was at the urologist office for follow-up appointment when he asked the nurse to check his oxygen level. It was reported to be 85%. He comes to the ER due to hypoxia. CT scan shows a large right pleural effusion. Use oxygen at home. He has had a cough for several weeks but denies fever, chills or sick contacts. He quit smoking 3 months ago but was smoking 1 pack a day for 60 years. He denies recent weight loss.  PAST MEDICAL HISTORY:   Past Medical History:  Diagnosis Date  . Cancer ([place])    skin  . Complication of anesthesia 1963   pt woke up during the procedure choking on drainage.  Marland Kitchen COPD (chronic obstructive pulmonary disease) ([name]   . Diverticulosis of colon   . GERD (gastroesophageal reflux disease)   . History of GI diverticular bleed 03/20-03/31/04   [facility]  . History of hiatal hernia   . Hyperlipidemia   . Hypertension   . Osteoarthritis   . Peripheral vascular disease ([name]    right foot tunred purole  . PONV (postoperative nausea and vomiting)    at age 89 with finger surgery.  . Renal insufficiency   . Smoking     PAST SURGICAL HISTORY:   Past Surgical History:  Procedure  Laterality Date  . COLONOSCOPY    . CYSTOSCOPY  1991   due to prolonged prostatitis  . CYSTOSCOPY W/ RETROGRADES Bilateral 10/10/2016   Procedure: CYSTOSCOPY WITH RETROGRADE PYELOGRAM;  Surgeon: Hollice Espy, MD;  Location: [facility];  Service: Urology;  Laterality: Bilateral;  . CYSTOSCOPY WITH BIOPSY N/A 10/10/2016   Procedure: CYSTOSCOPY WITH BIOPSY;  Surgeon: Hollice Espy, MD;  Location: [facility];  Service: Urology;  Laterality: N/A;  . CYSTOSCOPY WITH STENT PLACEMENT Left 10/10/2016   Procedure: CYSTOSCOPY WITH STENT PLACEMENT;  Surgeon: Hollice Espy, MD;  Location: [facility];  Service: Urology;  Laterality: Left;  . 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 PERIPHERAL VASCULAR CATHETERIZATION Right 06/20/2016   Procedure: Lower Extremity Angiography;  Surgeon: Algernon Huxley, MD;  Location: [facility];  Service: Cardiovascular;  Laterality: Right;  . URETEROSCOPY Left 10/10/2016   Procedure: URETEROSCOPY;  Surgeon: Hollice Espy, MD;  Location: [facility];  Service: Urology;  Laterality: Left;  . WISDOM TOOTH EXTRACTION      SOCIAL HISTORY:   Social History  Substance Use Topics  . Smoking status: Former Smoker    Packs/day: 1.00    Years: 40.00    Quit date: 07/31/2016  . Smokeless tobacco: Never Used     Comment: off and on for 40 years, 1 PPD  at first  . Alcohol use 12.6 - 16.8 oz/week    14 Standard drinks or equivalent, 7 - 14 Shots of liquor per week     Comment: couple of drinks a day    FAMILY HISTORY:   Family History  Problem Relation Age of Onset  . Asthma Mother     chronic, asthma attack caused MI  . Heart disease Mother     MI  . Thrombophlebitis Mother   . Hiatal hernia Mother   . Prostate cancer Neg Hx   . Colon cancer Neg Hx     DRUG ALLERGIES:   Allergies  Allergen Reactions  . Cefprozil     REACTION: Swelling  . Penicillins Swelling    Has patient had a PCN  reaction causing immediate rash, facial/tongue/throat swelling, SOB or lightheadedness with hypotension: Yes Has patient had a PCN reaction causing severe rash involving mucus membranes or skin necrosis: No Has patient had a PCN reaction that required hospitalization No Has patient had a PCN reaction occurring within the last 10 years: No If all of the above answers are "NO", then may proceed with Cephalosporin use.   Marland Kitchen Potassium Chloride     REACTION: SOB    REVIEW OF SYSTEMS:   Review of Systems  Constitutional: Positive for malaise/fatigue. Negative for chills and fever.  HENT: Negative.  Negative for ear discharge, ear pain, hearing loss, nosebleeds and sore throat.   Eyes: Negative.  Negative for blurred vision and pain.  Respiratory: Positive for cough and shortness of breath. Negative for hemoptysis and wheezing.   Cardiovascular: Positive for leg swelling. Negative for chest pain and palpitations.  Gastrointestinal: Negative.  Negative for abdominal pain, blood in stool, diarrhea, nausea and vomiting.  Genitourinary: Negative.  Negative for dysuria.  Musculoskeletal: Negative.  Negative for back pain.  Skin: Negative.   Neurological: Positive for weakness. Negative for dizziness, tremors, speech change, focal weakness, seizures and headaches.  Endo/Heme/Allergies: Negative.  Does not bruise/bleed easily.  Psychiatric/Behavioral: Negative.  Negative for depression, hallucinations and suicidal ideas.    MEDICATIONS AT HOME:   Prior to Admission medications   Medication Sig Start Date End Date Taking? Authorizing Provider  albuterol (PROVENTIL HFA;VENTOLIN HFA) 108 (90 Base) MCG/ACT inhaler Inhale 1-2 puffs into the lungs every 6 (six) hours as needed for wheezing or shortness of breath. 11/11/15  Yes Tonia Ghent, MD  amLODipine (NORVASC) 10 MG tablet TAKE 1 TABLET (10 MG TOTAL) BY MOUTH AT BEDTIME. 11/11/15  Yes Tonia Ghent, MD  aspirin EC 81 MG tablet Take 1 tablet (81  mg total) by mouth daily. 06/20/16  Yes Algernon Huxley, MD  bisoprolol-hydrochlorothiazide [facility]) 5-6.25 MG tablet Take 1 tablet by mouth daily. 11/11/15  Yes Tonia Ghent, MD  budesonide-formoterol [facility]) 160-4.5 MCG/ACT inhaler Inhale 2 puffs into the lungs 2 (two) times daily. 11/11/15  Yes Tonia Ghent, MD  cholecalciferol (VITAMIN D) 1000 UNITS tablet Take 1,000 Units by mouth daily.   Yes Historical Provider, MD  clopidogrel (PLAVIX) 75 MG tablet Take 1 tablet (75 mg total) by mouth daily. 06/20/16  Yes Algernon Huxley, MD  docusate sodium (COLACE) 100 MG capsule Take 1 capsule (100 mg total) by mouth 2 (two) times daily. 10/10/16  Yes Hollice Espy, MD  GLUCOSAMINE CHONDROITIN COMPLX PO Take 2 tablets by mouth daily.    Yes Historical Provider, MD  oxybutynin (DITROPAN) 5 MG tablet Take 1 tablet (5 mg total) by mouth every 8 (eight) hours  as needed for bladder spasms. 10/10/16  Yes Hollice Espy, MD  quinapril (ACCUPRIL) 40 MG tablet TAKE 1 TABLET (40 MG TOTAL) BY MOUTH DAILY. 11/11/15  Yes Tonia Ghent, MD  vitamin B-12 (CYANOCOBALAMIN) 1000 MCG tablet Take 1,000 mcg by mouth daily.     Yes Historical Provider, MD  atorvastatin (LIPITOR) 20 MG tablet Take 1 tablet (20 mg total) by mouth daily. Patient not taking: Reported on 10/20/2016 06/20/16   Algernon Huxley, MD  HYDROcodone-acetaminophen (NORCO/VICODIN) 5-325 MG tablet Take 1-2 tablets by mouth every 6 (six) hours as needed for moderate pain. Patient not taking: Reported on 10/20/2016 10/10/16   Hollice Espy, MD      VITAL SIGNS:  Blood pressure 134/75, pulse 67, temperature 97.6 F (36.4 C), temperature source Oral, resp. rate 19, height '5\' 4"'$  (1.626 m), weight 75.3 kg (166 lb), SpO2 95 %.  PHYSICAL EXAMINATION:   Physical Exam    LABORATORY PANEL:   CBC  Recent Labs Lab 10/20/16 1046  WBC 9.6  HGB 12.8*  HCT 36.6*  PLT 255    ------------------------------------------------------------------------------------------------------------------  Chemistries   Recent Labs Lab 10/20/16 1046  NA 137  K 3.5  CL 103  CO2 24  GLUCOSE 98  BUN 39*  CREATININE 2.09*  CALCIUM 8.6*   ------------------------------------------------------------------------------------------------------------------  Cardiac Enzymes  Recent Labs Lab 10/20/16 1046  TROPONINI <0.03   ------------------------------------------------------------------------------------------------------------------  RADIOLOGY:  Ct Angio Chest Pe W And/or Wo Contrast  Result Date: 10/20/2016 CLINICAL DATA:  Shortness of Breath EXAM: CT ANGIOGRAPHY CHEST WITH CONTRAST TECHNIQUE: Multidetector CT imaging of the chest was performed using the standard protocol during bolus administration of intravenous contrast. Multiplanar CT image reconstructions and MIPs were obtained to evaluate the vascular anatomy. CONTRAST:  75 mL Isovue 370 nonionic COMPARISON:  Chest radiograph October 04, 2016 and CT abdomen and pelvis September 26, 2016 FINDINGS: Cardiovascular: There is no demonstrable pulmonary embolus. There is no appreciable thoracic aortic aneurysm or dissection. There is moderate atherosclerotic calcification at the origins of the great vessels. There is moderate atherosclerotic calcification in the aorta. There is peripheral thrombus in portions of the descending thoracic aorta. There are multiple foci of coronary artery calcification. The pericardium is not appreciably thickened. Mediastinum/Nodes: Thyroid appears unremarkable. There are multiple subcentimeter lymph nodes in the mediastinum. In the right axillary region, there is no adenopathy. However, there is soft tissue stranding in the right axillary region. There are mildly prominent lymph nodes in the aortopulmonary window region, largest measuring 1.5 x 1.0 cm. There is an enlarged lymph node immediately  anterior to the carina on the right measuring 2.0 x 1.4 cm. There is a sub- carinal lymph node measuring 1.6 x 1.2 cm. Lungs/Pleura: There is a sizable free-flowing pleural effusion on the right. There is lower lobe region interstitial edema bilaterally. There are multiple foci of airspace consolidation bilaterally including in both lower lobes, primarily in the posterior segment left lower lobe, in the posterior segments of both upper lobes, in the anterior segment right upper lobe, and in both perihilar regions, more pronounced on the left than on the right. Previously noted 1 x 1 cm nodular opacity in the anterior segment of the right lower lobe is seen on axial slice 70 series 6, partially obscured by adjacent fluid and edema. Upper Abdomen: In the visualized upper abdomen, there is diffuse adrenal hypertrophy, stable. There is a cyst arising from the posterior upper pole right kidney measuring 2.7 x 2.1 cm. There is incomplete visualization of  a cyst in the upper pole of the left kidney measuring 2.4 x 2.1 cm. There are multiple foci of atherosclerotic calcification in the aorta and major visualized mesenteric arteries. There is incomplete visualization of a 3 mm calculus in the upper pole of the right kidney. Liver contour raises concern for a degree of underlying hepatic cirrhosis. Musculoskeletal: There is degenerative change in the thoracic spine. There are no blastic or lytic bone lesions. Review of the MIP images confirms the above findings. IMPRESSION: No demonstrable pulmonary embolus. Evidence of a degree of congestive heart failure. Note that there is interstitial edema and fairly sizable right pleural effusion. Multiple areas of alveolar opacity are felt to be most likely due to multifocal pneumonia, although some of this opacity may represent alveolar edema. Both edema and pneumonia can present concurrently. Previously noted 1 x 1 cm nodular opacity in the anterior segment right lower lobe persists.  Small neoplasm in this area must be of concern. This finding may well warrant nuclear medicine PET study to further evaluate given its persistence and size. Several prominent lymph nodes. Question reactive versus neoplastic etiology. There is soft tissue stranding in the right axilla with several small lymph nodes in this area. 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 Areas of atherosclerotic calcification in the aorta. Multiple foci of coronary artery calcification evident. Stable bilateral adrenal hypertrophy. Nonobstructing calculus upper pole right kidney. Contour of liver suggests a degree of underlying cirrhosis. Electronically Signed   By: Lowella Grip III M.D.   On: 10/20/2016 13:21    EKG:  Sinus rhythm no ST elevation or depression  IMPRESSION AND PLAN:   74 year old male with recent left ureteral stent placed and hypertension who was thought to have acute hypoxic respiratory failure and large right pleural effusion.  1. Acute hypoxic respiratory failure due to large right pleural effusion with history of long-term tobacco abuse: Patient will need ultrasound-guided thoracentesis which has been ordered with lab work as well. Discussed case with Dr.kasa Check TSH He had a recent echo in September which shows preserved ejection fraction and no major valvular abnormalities I will not start antibiotics as patient has no symptoms of pneumonia.  2. Right pleural effusion: Plan as outlined above  3. Chronic kidney disease stage III: Creatinine is near baseline and has improved since stent placement  4. Essential hypertension on Norvasc,Ziac and ACE inhibitor  5. PVDx: Continue aspirin, atorvastatin  6. COPD without exacerbation  7. Hyperlipidemia: Continue Lipitor    All the records are reviewed and case discussed with ED provider. Management plans discussed with the patient and daughter and he is  in agreement  CODE STATUS: FULL  TOTAL TIME TAKING  CARE OF THIS PATIENT: 45 minutes.    Merrill Villarruel M.D on 10/20/2016 at 2:05 PM  Between 7am to 6pm - Pager - [phone]  After 6pm go to [email or web address] - password [email or web address] [facility]  Office  [phone]  CC: Primary care physician; Elsie Stain, MD

## 2016-10-20 NOTE — Progress Notes (Signed)
Family Meeting Note  Advance Directive:no  Today a meeting took place with the Patient.and daughter     The following clinical team members were present during this meeting:MDr  The following were discussed:Patient's diagnosis: , Patient's progosis: > 12 months and Goals for treatment: Full Code  Additional follow-up to be provided: Patient will need assistance during this hospital stay with advanced rectus  Time spent during discussion:16 minutes  Benjie Karvonen, Isa Hitz MD

## 2016-10-20 NOTE — ED Notes (Signed)
Patient transported to CT 

## 2016-10-20 NOTE — ED Provider Notes (Signed)
[facility] Emergency Department Provider Note  ____________________________________________   First MD Initiated Contact with Patient 10/20/16 1103     (approximate)  I have reviewed the triage vital signs and the nursing notes.   HISTORY  Chief Complaint Shortness of Breath   HPI Edwin Moran is a 75 y.o. 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 He denies any pain. Feels that he is congested. Denies any fever. Went to his urologist appointment this morning and was sent to the emergency department because of an oxygen saturation of 85%. He says that he is having difficulty ambulating because of the worsening shortness of breath.Currently the patient is not on any home oxygen.   Past Medical History:  Diagnosis Date  . Cancer ([place])    skin  . Complication of anesthesia 1963   pt woke up during the procedure choking on drainage.  Marland Kitchen COPD (chronic obstructive pulmonary disease) ([place])   . Diverticulosis of colon   . GERD (gastroesophageal reflux disease)   . History of GI diverticular bleed 03/20-03/31/04   [facility]  . History of hiatal hernia   . Hyperlipidemia   . Hypertension   . Osteoarthritis   . Peripheral vascular disease ([place])    right foot tunred purole  . PONV (postoperative nausea and vomiting)    at age 48 with finger surgery.  . Renal insufficiency   . Smoking     Patient Active Problem List   Diagnosis Date Noted  . COPD exacerbation ([place]) 10/04/2016  . Hydronephrosis, Left 09/27/2016  . Chronic kidney disease, stage 3 09/27/2016  . Hematuria 08/28/2016  . Pulmonary nodule 08/28/2016  . PAD (peripheral artery disease) ([place]) 06/14/2016  . Advance care planning 10/07/2014  . SOB (shortness of breath) on exertion 01/02/2014  . Medicare annual wellness visit, subsequent 03/21/2013  . ARTHRITIS, CARPOMETACARPAL JOINT,  BILATERAL 05/28/2009  . DISORDER, TOBACCO USE 05/11/2007  . Essential hypertension 05/11/2007  . ANEMIA, VITAMIN B12 DEFICIENCY 05/10/2007  . DIVERTICULOSIS, COLON 05/10/2007  . OSTEOARTHRITIS 05/10/2007    Past Surgical History:  Procedure Laterality Date  . COLONOSCOPY    . CYSTOSCOPY  1991   due to prolonged prostatitis  . CYSTOSCOPY W/ RETROGRADES Bilateral 10/10/2016   Procedure: CYSTOSCOPY WITH RETROGRADE PYELOGRAM;  Surgeon: Hollice Espy, MD;  Location: [facility] ORS;  Service: Urology;  Laterality: Bilateral;  . CYSTOSCOPY WITH BIOPSY N/A 10/10/2016   Procedure: CYSTOSCOPY WITH BIOPSY;  Surgeon: Hollice Espy, MD;  Location: [facility] ORS;  Service: Urology;  Laterality: N/A;  . CYSTOSCOPY WITH STENT PLACEMENT Left 10/10/2016   Procedure: CYSTOSCOPY WITH STENT PLACEMENT;  Surgeon: Hollice Espy, MD;  Location: [facility] ORS;  Service: Urology;  Laterality: Left;  . 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 PERIPHERAL VASCULAR CATHETERIZATION Right 06/20/2016   Procedure: Lower Extremity Angiography;  Surgeon: Algernon Huxley, MD;  Location: [facility];  Service: Cardiovascular;  Laterality: Right;  . URETEROSCOPY Left 10/10/2016   Procedure: URETEROSCOPY;  Surgeon: Hollice Espy, MD;  Location: [facility] ORS;  Service: Urology;  Laterality: Left;  . WISDOM TOOTH EXTRACTION      Prior to Admission medications   Medication Sig Start Date End Date Taking? Authorizing Provider  albuterol (PROVENTIL HFA;VENTOLIN HFA) 108 (90 Base) MCG/ACT inhaler Inhale 1-2 puffs into the lungs every 6 (six) hours as needed for wheezing  or shortness of breath. 11/11/15  Yes Tonia Ghent, MD  amLODipine (NORVASC) 10 MG tablet TAKE 1 TABLET (10 MG TOTAL) BY MOUTH AT BEDTIME. 11/11/15  Yes Tonia Ghent, MD  aspirin EC 81 MG tablet Take 1 tablet (81 mg total) by mouth daily. 06/20/16  Yes Algernon Huxley, MD  bisoprolol-hydrochlorothiazide  [facility]) 5-6.25 MG tablet Take 1 tablet by mouth daily. 11/11/15  Yes Tonia Ghent, MD  budesonide-formoterol [facility]) 160-4.5 MCG/ACT inhaler Inhale 2 puffs into the lungs 2 (two) times daily. 11/11/15  Yes Tonia Ghent, MD  cholecalciferol (VITAMIN D) 1000 UNITS tablet Take 1,000 Units by mouth daily.   Yes Historical Provider, MD  clopidogrel (PLAVIX) 75 MG tablet Take 1 tablet (75 mg total) by mouth daily. 06/20/16  Yes Algernon Huxley, MD  docusate sodium (COLACE) 100 MG capsule Take 1 capsule (100 mg total) by mouth 2 (two) times daily. 10/10/16  Yes Hollice Espy, MD  GLUCOSAMINE CHONDROITIN COMPLX PO Take 2 tablets by mouth daily.    Yes Historical Provider, MD  oxybutynin (DITROPAN) 5 MG tablet Take 1 tablet (5 mg total) by mouth every 8 (eight) hours as needed for bladder spasms. 10/10/16  Yes Hollice Espy, MD  quinapril (ACCUPRIL) 40 MG tablet TAKE 1 TABLET (40 MG TOTAL) BY MOUTH DAILY. 11/11/15  Yes Tonia Ghent, MD  vitamin B-12 (CYANOCOBALAMIN) 1000 MCG tablet Take 1,000 mcg by mouth daily.     Yes Historical Provider, MD  atorvastatin (LIPITOR) 20 MG tablet Take 1 tablet (20 mg total) by mouth daily. Patient not taking: Reported on 10/20/2016 06/20/16   Algernon Huxley, MD  HYDROcodone-acetaminophen (NORCO/VICODIN) 5-325 MG tablet Take 1-2 tablets by mouth every 6 (six) hours as needed for moderate pain. Patient not taking: Reported on 10/20/2016 10/10/16   Hollice Espy, MD    Allergies Cefprozil; Penicillins; and Potassium chloride  Family History  Problem Relation Age of Onset  . Asthma Mother     chronic, asthma attack caused MI  . Heart disease Mother     MI  . Thrombophlebitis Mother   . Hiatal hernia Mother   . Prostate cancer Neg Hx   . Colon cancer Neg Hx     Social History Social History  Substance Use Topics  . Smoking status: Former Smoker    Packs/day: 1.00    Years: 40.00    Quit date: 07/31/2016  . Smokeless tobacco: Never Used     Comment: off and  on for 40 years, 1 PPD at first  . Alcohol use 12.6 - 16.8 oz/week    14 Standard drinks or equivalent, 7 - 14 Shots of liquor per week     Comment: couple of drinks a day    Review of Systems Constitutional: No fever/chills Eyes: No visual changes. ENT: No sore throat. Cardiovascular: Denies chest pain. Respiratory: Denies shortness of breath. Gastrointestinal: No abdominal pain.  No nausea, no vomiting.  No diarrhea.  No constipation. Genitourinary: Negative for dysuria. Musculoskeletal: Negative for back pain. Skin: Negative for rash. Neurological: Negative for headaches, focal weakness or numbness.  10-point ROS otherwise negative.  ____________________________________________   PHYSICAL EXAM:  VITAL SIGNS: ED Triage Vitals [10/20/16 1044]  Enc Vitals Group     BP (!) 100/56     Pulse Rate (!) 55     Resp 18     Temp 97.6 F (36.4 C)     Temp Source Oral     SpO2 (!) 89 %  Weight 166 lb (75.3 kg)     Height '5\' 4"'$  (1.626 m)     Head Circumference      Peak Flow      Pain Score 0     Pain Loc      Pain Edu?      Excl. in [place]?     Constitutional: Alert and oriented. Well appearing and in no acute distress. Eyes: Conjunctivae are normal. PERRL. EOMI. Head: Atraumatic. Nose: No congestion/rhinnorhea.Wearing nasal cannula oxygen. Mouth/Throat: Mucous membranes are moist.   Neck: No stridor.   Cardiovascular: Normal rate, regular rhythm. Grossly normal heart sounds.   Respiratory: Normal respiratory effort.  No retractions. Mildly prolonged expiratory phase with mild rales and scant wheezing to the bases with greater rales and wheezing to left. Gastrointestinal: Soft and nontender. No distention. No abdominal bruits. No CVA tenderness. Musculoskeletal: Mild bilateral lower extremity edema to the calf and ankles.  No joint effusions. Neurologic:  Normal speech and language. No gross focal neurologic deficits are appreciated.  Skin:  Skin is warm, dry and intact.  No rash noted. Psychiatric: Mood and affect are normal. Speech and behavior are normal.  ____________________________________________   LABS (all labs ordered are listed, but only abnormal results are displayed)  Labs Reviewed  CBC WITH DIFFERENTIAL/PLATELET - Abnormal; Notable for the following:       Result Value   RBC 3.80 (*)    Hemoglobin 12.8 (*)    HCT 36.6 (*)    RDW 15.0 (*)    Neutro Abs 8.0 (*)    Lymphs Abs 0.6 (*)    All other components within normal limits  BASIC METABOLIC PANEL - Abnormal; Notable for the following:    BUN 39 (*)    Creatinine, Ser 2.09 (*)    Calcium 8.6 (*)    GFR calc non Af Amer 30 (*)    GFR calc Af Amer 34 (*)    All other components within normal limits  BRAIN NATRIURETIC PEPTIDE  TROPONIN I   ____________________________________________  EKG  ED ECG REPORT I, Doran Stabler, the attending physician, personally viewed and interpreted this ECG.   Date: 10/20/2016  EKG Time: 1104  Rate: 60  Rhythm: normal sinus rhythm  Axis: normal  Intervals:none  ST&T Change: See segment elevation or depression. Single T-wave inversion in aVL.  ____________________________________________  RADIOLOGY  CT Angio Chest PE W and/or Wo Contrast (Final result)  Result time 10/20/16 13:21:44  Final result by Lowella Grip III, MD (10/20/16 13:21:44)           Narrative:   CLINICAL DATA: Shortness of Breath  EXAM: CT ANGIOGRAPHY CHEST WITH CONTRAST  TECHNIQUE: Multidetector CT imaging of the chest was performed using the standard protocol during bolus administration of intravenous contrast. Multiplanar CT image reconstructions and MIPs were obtained to evaluate the vascular anatomy.  CONTRAST: 75 mL Isovue 370 nonionic  COMPARISON: Chest radiograph October 04, 2016 and CT abdomen and pelvis September 26, 2016  FINDINGS: Cardiovascular: There is no demonstrable pulmonary embolus. There is no appreciable thoracic aortic  aneurysm or dissection. There is moderate atherosclerotic calcification at the origins of the great vessels. There is moderate atherosclerotic calcification in the aorta. There is peripheral thrombus in portions of the descending thoracic aorta. There are multiple foci of coronary artery calcification. The pericardium is not appreciably thickened.  Mediastinum/Nodes: Thyroid appears unremarkable. There are multiple subcentimeter lymph nodes in the mediastinum. In the right axillary region, there is no adenopathy.  However, there is soft tissue stranding in the right axillary region. There are mildly prominent lymph nodes in the aortopulmonary window region, largest measuring 1.5 x 1.0 cm. There is an enlarged lymph node immediately anterior to the carina on the right measuring 2.0 x 1.4 cm. There is a sub- carinal lymph node measuring 1.6 x 1.2 cm.  Lungs/Pleura: There is a sizable free-flowing pleural effusion on the right. There is lower lobe region interstitial edema bilaterally. There are multiple foci of airspace consolidation bilaterally including in both lower lobes, primarily in the posterior segment left lower lobe, in the posterior segments of both upper lobes, in the anterior segment right upper lobe, and in both perihilar regions, more pronounced on the left than on the right. Previously noted 1 x 1 cm nodular opacity in the anterior segment of the right lower lobe is seen on axial slice 70 series 6, partially obscured by adjacent fluid and edema.  Upper Abdomen: In the visualized upper abdomen, there is diffuse adrenal hypertrophy, stable. There is a cyst arising from the posterior upper pole right kidney measuring 2.7 x 2.1 cm. There is incomplete visualization of a cyst in the upper pole of the left kidney measuring 2.4 x 2.1 cm. There are multiple foci of atherosclerotic calcification in the aorta and major visualized mesenteric arteries. There is incomplete  visualization of a 3 mm calculus in the upper pole of the right kidney. Liver contour raises concern for a degree of underlying hepatic cirrhosis.  Musculoskeletal: There is degenerative change in the thoracic spine. There are no blastic or lytic bone lesions.  Review of the MIP images confirms the above findings.  IMPRESSION: No demonstrable pulmonary embolus. Evidence of a degree of congestive heart failure. Note that there is interstitial edema and fairly sizable right pleural effusion. Multiple areas of alveolar opacity are felt to be most likely due to multifocal pneumonia, although some of this opacity may represent alveolar edema. Both edema and pneumonia can present concurrently.  Previously noted 1 x 1 cm nodular opacity in the anterior segment right lower lobe persists. Small neoplasm in this area must be of concern. This finding may well warrant nuclear medicine PET study to further evaluate given its persistence and size.  Several prominent lymph nodes. Question reactive versus neoplastic etiology. There is soft tissue stranding in the right axilla with several small lymph nodes in this area. 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  Areas of atherosclerotic calcification in the aorta. Multiple foci of coronary artery calcification evident.  Stable bilateral adrenal hypertrophy.  Nonobstructing calculus upper pole right kidney. Contour of liver suggests a degree of underlying cirrhosis.   Electronically Signed By: Lowella Grip III M.D. On: 10/20/2016 13:21            ____________________________________________   PROCEDURES  Procedure(s) performed:   Procedures  Critical Care performed:   ____________________________________________   INITIAL IMPRESSION / ASSESSMENT AND PLAN / ED COURSE  Pertinent labs & imaging results that were available during my care of the patient were reviewed by me and considered in my  medical decision making (see chart for details).   Clinical Course   ----------------------------------------- 11:33 AM on 10/20/2016 -----------------------------------------   I discussed the case with Dr. Erlene Quan who was concerned about the patient's ureteral obstruction being extrinsic. She was planning, however not necessarily emergently, to have a CT urogram performed.  ----------------------------------------- 2:02 PM on 10/20/2016 -----------------------------------------  Reduced dye load given because of decreased renal function. Patient  found to have a large pleural effusion. Unclear of the cause. Differential includes secondary to neoplasm versus pneumonia versus CHF. Patient without any distress while at rest and on nasal cannula oxygen. We'll admit to the hospital. Signed out to Dr. Genia Harold. We discussed antibiotic treatment and agreed to hold this time. The patient has no fever or elevated white blood cell count. Discussed imaging as well as lab findings with the patient and family as well as the need for permission to the hospital. They're understanding and wanted to comply. ____________________________________________   FINAL CLINICAL IMPRESSION(S) / ED DIAGNOSES  Hypoxia. Pleural effusion.    NEW MEDICATIONS STARTED DURING THIS VISIT:  New Prescriptions   No medications on file     Note:  This document was prepared using Dragon voice recognition software and may include unintentional dictation errors.    Orbie Pyo, MD 10/20/16 [phone]

## 2016-10-20 NOTE — Progress Notes (Signed)
10/20/2016 10:25 AM   Edwin Moran 1940-12-12 [id number]  Referring provider: Tonia Ghent, MD [address] [place], [place] [address]  Chief Complaint  Patient presents with  . Results    1 week w/path results    HPI: 75 year old male who initially presented for further evaluation of gross hematuria in 07/2016. He had 2 CT scans (noncontrast due to CKD) which indicated left hydronephrosis and questionable left distal ureteral mass.  He also had some borderline peri-aortic lymphadenopathy near the kidney.  He was taken to the OR on 10/10/2016 for further evaluation including retrograde pyelogram, left ureteroscopy ureteral biopsy/cytology and stent placement with a Bard Optima stent.  Retrograde pyelogram revealed near complete obstruction of the left distal ureter with possible extrinsic compression.  Biopsy was unable to be process, urine cytology noted to be acellular although this was a brush cytology which is curious.  Flexible scope was unable to be advanced all the way up to the level of the upper tract collecting system on the left side due ureteral narrowing/abnormality.  He is a former smoker, 40 PY.  He does have pulmonary nodules appreciated on CT scan which have not yet been addressed.  Baseline Cr 1.7-1.9   Today in clinic, he is hypoxic with an O2 sat of 85%. This is drastically different his baseline. He is non-tachycardic and normotensive. He is feeling short of breath and slightly tachypnic. He is accompanied today by his daughter.   PMH: Past Medical History:  Diagnosis Date  . Cancer ([place])    skin  . Complication of anesthesia 1963   pt woke up during the procedure choking on drainage.  Marland Kitchen COPD (chronic obstructive pulmonary disease) ([place])   . Diverticulosis of colon   . GERD (gastroesophageal reflux disease)   . History of GI diverticular bleed 03/20-03/31/04   [facility]  . History of hiatal hernia   . Hyperlipidemia   . Hypertension   .  Osteoarthritis   . Peripheral vascular disease ([place])    right foot tunred purole  . PONV (postoperative nausea and vomiting)    at age 68 with finger surgery.  . Renal insufficiency   . Smoking     Surgical History: Past Surgical History:  Procedure Laterality Date  . COLONOSCOPY    . CYSTOSCOPY  1991   due to prolonged prostatitis  . CYSTOSCOPY W/ RETROGRADES Bilateral 10/10/2016   Procedure: CYSTOSCOPY WITH RETROGRADE PYELOGRAM;  Surgeon: Hollice Espy, MD;  Location: [facility];  Service: Urology;  Laterality: Bilateral;  . CYSTOSCOPY WITH BIOPSY N/A 10/10/2016   Procedure: CYSTOSCOPY WITH BIOPSY;  Surgeon: Hollice Espy, MD;  Location: [facility];  Service: Urology;  Laterality: N/A;  . CYSTOSCOPY WITH STENT PLACEMENT Left 10/10/2016   Procedure: CYSTOSCOPY WITH STENT PLACEMENT;  Surgeon: Hollice Espy, MD;  Location: [facility];  Service: Urology;  Laterality: Left;  . 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 PERIPHERAL VASCULAR CATHETERIZATION Right 06/20/2016   Procedure: Lower Extremity Angiography;  Surgeon: Algernon Huxley, MD;  Location: [facility];  Service: Cardiovascular;  Laterality: Right;  . URETEROSCOPY Left 10/10/2016   Procedure: URETEROSCOPY;  Surgeon: Hollice Espy, MD;  Location: [facility];  Service: Urology;  Laterality: Left;  . WISDOM TOOTH EXTRACTION      Home Medications:  Allergies as of 10/20/2016      Reactions   Cefprozil    REACTION: Swelling   Penicillins Swelling   Has  patient had a PCN reaction causing immediate rash, facial/tongue/throat swelling, SOB or lightheadedness with hypotension: Yes Has patient had a PCN reaction causing severe rash involving mucus membranes or skin necrosis: No Has patient had a PCN reaction that required hospitalization No Has patient had a PCN reaction occurring within the last 10 years: No If all of the above answers are "NO", then may  proceed with Cephalosporin use.   Potassium Chloride    REACTION: SOB      Medication List       Accurate as of 10/20/16 10:25 AM. Always use your most recent med list.          albuterol 108 (90 Base) MCG/ACT inhaler Commonly known as:  PROVENTIL HFA;VENTOLIN HFA Inhale 1-2 puffs into the lungs every 6 (six) hours as needed for wheezing or shortness of breath.   amLODipine 10 MG tablet Commonly known as:  NORVASC TAKE 1 TABLET (10 MG TOTAL) BY MOUTH AT BEDTIME.   aspirin EC 81 MG tablet Take 1 tablet (81 mg total) by mouth daily.   atorvastatin 20 MG tablet Commonly known as:  LIPITOR Take 1 tablet (20 mg total) by mouth daily.   bisoprolol-hydrochlorothiazide 5-6.25 MG tablet Commonly known as:  ZIAC Take 1 tablet by mouth daily.   budesonide-formoterol 160-4.5 MCG/ACT inhaler Commonly known as:  SYMBICORT Inhale 2 puffs into the lungs 2 (two) times daily.   cholecalciferol 1000 units tablet Commonly known as:  VITAMIN D Take 1,000 Units by mouth daily.   clopidogrel 75 MG tablet Commonly known as:  PLAVIX Take 1 tablet (75 mg total) by mouth daily.   docusate sodium 100 MG capsule Commonly known as:  COLACE Take 1 capsule (100 mg total) by mouth 2 (two) times daily.   GLUCOSAMINE CHONDROITIN COMPLX PO Take 2 tablets by mouth daily.   HYDROcodone-acetaminophen 5-325 MG tablet Commonly known as:  NORCO/VICODIN Take 1-2 tablets by mouth every 6 (six) hours as needed for moderate pain.   oxybutynin 5 MG tablet Commonly known as:  DITROPAN Take 1 tablet (5 mg total) by mouth every 8 (eight) hours as needed for bladder spasms.   quinapril 40 MG tablet Commonly known as:  ACCUPRIL TAKE 1 TABLET (40 MG TOTAL) BY MOUTH DAILY.   vitamin B-12 1000 MCG tablet Commonly known as:  CYANOCOBALAMIN Take 1,000 mcg by mouth daily.       Allergies:  Allergies  Allergen Reactions  . Cefprozil     REACTION: Swelling  . Penicillins Swelling    Has patient had a  PCN reaction causing immediate rash, facial/tongue/throat swelling, SOB or lightheadedness with hypotension: Yes Has patient had a PCN reaction causing severe rash involving mucus membranes or skin necrosis: No Has patient had a PCN reaction that required hospitalization No Has patient had a PCN reaction occurring within the last 10 years: No If all of the above answers are "NO", then may proceed with Cephalosporin use.   Marland Kitchen Potassium Chloride     REACTION: SOB    Family History: Family History  Problem Relation Age of Onset  . Asthma Mother     chronic, asthma attack caused MI  . Heart disease Mother     MI  . Thrombophlebitis Mother   . Hiatal hernia Mother   . Prostate cancer Neg Hx   . Colon cancer Neg Hx     Social History:  reports that he quit smoking about 2 months ago. He has a 40.00 pack-year smoking history. He has  never used smokeless tobacco. He reports that he drinks about 12.6 - 16.8 oz of alcohol per week . He reports that he does not use drugs.  ROS: UROLOGY Frequent Urination?: No Hard to postpone urination?: No Burning/pain with urination?: No Get up at night to urinate?: Yes Leakage of urine?: No Urine stream starts and stops?: No Trouble starting stream?: No Do you have to strain to urinate?: No Blood in urine?: No Urinary tract infection?: No Sexually transmitted disease?: No Injury to kidneys or bladder?: No Painful intercourse?: No Weak stream?: No Erection problems?: No Penile pain?: No  Gastrointestinal Nausea?: No Vomiting?: No Indigestion/heartburn?: No Diarrhea?: No Constipation?: No  Constitutional Fever: No Night sweats?: No Weight loss?: Yes Fatigue?: Yes  Skin Skin rash/lesions?: No Itching?: No  Eyes Blurred vision?: No Double vision?: No  Ears/Nose/Throat Sore throat?: No Sinus problems?: No  Hematologic/Lymphatic Swollen glands?: No Easy bruising?: Yes  Cardiovascular Leg swelling?: Yes Chest pain?:  No  Respiratory Cough?: Yes Shortness of breath?: Yes  Endocrine Excessive thirst?: No  Musculoskeletal Back pain?: No Joint pain?: No  Neurological Headaches?: No Dizziness?: Yes  Psychologic Depression?: No Anxiety?: No  Physical Exam: BP 107/69   Pulse 71   Ht '5\' 4"'$  (1.626 m)   Wt 166 lb (75.3 kg)   SpO2 (!) 85%   BMI 28.49 kg/m   Constitutional:  Alert and oriented, mild distress, accompanied by daughter. HEENT: [place] AT, moist mucus membranes.  Trachea midline, no masses. Cardiovascular: No clubbing, cyanosis, or edema. Respiratory: Increased work of breathing, slightly to, in mild respiratory distress. GI: Abdomen is soft, nontender, nondistended, no abdominal masses  Skin: No rashes, bruises or suspicious lesions. Neurologic: Grossly intact, no focal deficits, moving all 4 extremities. Psychiatric: Normal mood and affect.  Laboratory Data: Lab Results  Component Value Date   WBC 8.8 08/27/2016   HGB 15.3 08/27/2016   HCT 43.9 08/27/2016   MCV 94.6 08/27/2016   PLT 225 08/27/2016    Lab Results  Component Value Date   CREATININE 2.40 (H) 09/16/2016    Lab Results  Component Value Date   PSA 1.34 03/10/2011   PSA 1.23 02/11/2010   PSA 0.88 07/31/2008    Pertinent Imaging: CT abd/ pelvis from 09/26/16 reviewed again today  Assessment & Plan:    1. Hydronephrosis, unspecified hydronephrosis type Discussed briefly today, etiology remains unclear. Unable to complete diagnostic ureteroscopy due to inability to advance the scope up to upper tract collecting system. Biopsies were acellular inconclusive as expected.  Ureteral stent is in place.  Ultimately, would like to obtain voided urine cytology and possibly return to the operating room for second look after ureteral dilation. This discussion was however deferred today due to the patient's clinical status as below.  Given the patient's hypoxia and tachypnea today, I have advised him to proceed  urgently to the emergency room. An expected call was placed to the emergency room to anticipate his arrival.  Further discussion of his diagnostic workup for his ureteral abnormality/   2. Chronic kidney disease, stage 3 Plan to recheck a BMP today to assess for improvement of renal function after stenting  3. Hypoxia To ER as above  4. Pulmonary nodule    Will arrange follow up pending findings during work up for hypoxia  Hollice Espy, MD  [facility] [address] [place], [place] [address] [phone]

## 2016-10-20 NOTE — Procedures (Signed)
Successful US guided right thoracentesis. Removed 800 ml of amber colored fluid.  No immediate complication.  CXR ordered.

## 2016-10-21 ENCOUNTER — Other Ambulatory Visit: Payer: Self-pay | Admitting: Pathology

## 2016-10-21 ENCOUNTER — Encounter (INDEPENDENT_AMBULATORY_CARE_PROVIDER_SITE_OTHER): Payer: Medicare Other

## 2016-10-21 ENCOUNTER — Ambulatory Visit (INDEPENDENT_AMBULATORY_CARE_PROVIDER_SITE_OTHER): Payer: Medicare Other | Admitting: Vascular Surgery

## 2016-10-21 LAB — CBC
HEMATOCRIT: 35.3 % — AB (ref 40.0–52.0)
HEMOGLOBIN: 12.4 g/dL — AB (ref 13.0–18.0)
MCH: 33.6 pg (ref 26.0–34.0)
MCHC: 35.1 g/dL (ref 32.0–36.0)
MCV: 95.6 fL (ref 80.0–100.0)
Platelets: 220 10*3/uL (ref 150–440)
RBC: 3.69 MIL/uL — AB (ref 4.40–5.90)
RDW: 14.8 % — AB (ref 11.5–14.5)
WBC: 8.2 10*3/uL (ref 3.8–10.6)

## 2016-10-21 LAB — PH, BODY FLUID: pH, Body Fluid: 7.5

## 2016-10-21 LAB — TRIGLYCERIDES, BODY FLUIDS: Triglycerides, Fluid: 30 mg/dL

## 2016-10-21 LAB — SEDIMENTATION RATE: Sed Rate: 68 mm/hr — ABNORMAL HIGH (ref 0–20)

## 2016-10-21 LAB — BASIC METABOLIC PANEL
ANION GAP: 10 (ref 5–15)
BUN: 35 mg/dL — ABNORMAL HIGH (ref 6–20)
CHLORIDE: 107 mmol/L (ref 101–111)
CO2: 21 mmol/L — ABNORMAL LOW (ref 22–32)
Calcium: 8 mg/dL — ABNORMAL LOW (ref 8.9–10.3)
Creatinine, Ser: 1.86 mg/dL — ABNORMAL HIGH (ref 0.61–1.24)
GFR, EST AFRICAN AMERICAN: 39 mL/min — AB (ref >60)
GFR, EST NON AFRICAN AMERICAN: 34 mL/min — AB (ref >60)
Glucose, Bld: 90 mg/dL (ref 65–99)
POTASSIUM: 3.2 mmol/L — AB (ref 3.5–5.1)
SODIUM: 138 mmol/L (ref 135–145)

## 2016-10-21 MED ORDER — ENOXAPARIN SODIUM 40 MG/0.4ML ~~LOC~~ SOLN
40.0000 mg | SUBCUTANEOUS | Status: DC
  Administered 2016-10-21 – 2016-10-25 (×5): 40 mg via SUBCUTANEOUS
  Filled 2016-10-21 (×5): qty 0.4

## 2016-10-21 MED ORDER — AZITHROMYCIN 250 MG PO TABS
500.0000 mg | ORAL_TABLET | Freq: Every day | ORAL | Status: AC
  Administered 2016-10-21: 500 mg via ORAL
  Filled 2016-10-21: qty 2

## 2016-10-21 MED ORDER — BISOPROLOL-HYDROCHLOROTHIAZIDE 5-6.25 MG PO TABS
1.0000 | ORAL_TABLET | Freq: Every day | ORAL | Status: DC
  Administered 2016-10-21: 1 via ORAL
  Filled 2016-10-21 (×3): qty 1

## 2016-10-21 MED ORDER — AZITHROMYCIN 250 MG PO TABS
250.0000 mg | ORAL_TABLET | Freq: Every day | ORAL | Status: DC
  Administered 2016-10-22 – 2016-10-23 (×2): 250 mg via ORAL
  Filled 2016-10-21 (×2): qty 1

## 2016-10-21 NOTE — Progress Notes (Signed)
Initial Nutrition Assessment  DOCUMENTATION CODES:   Severe malnutrition in context of acute illness/injury  INTERVENTION:  Patient refusing the recommended oral nutrition supplements.  Provide snacks po BID between meals. RD to order in [facility].   NUTRITION DIAGNOSIS:   Malnutrition (Severe) related to acute illness as evidenced by 5 percent weight loss over 2 weeks, energy intake < or equal to 50% for > or equal to 5 days.  GOAL:   Patient will meet greater than or equal to 90% of their needs  MONITOR:   PO intake, Labs, Weight trends, I & O's  REASON FOR ASSESSMENT:   Malnutrition Screening Tool    ASSESSMENT:   75 year old male with past medical history of COPD, GERD, hypertension, hyperlipidemia, osteoarthritis, peripheral vascular disease who presented to the hospital with shortness of breath. Found to have acute respiratory failure with hypoxia secondary to right-sided pleural effusion now s/p US-guided thoracentesis (800 ml removed).    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  Spoke with patient and his daughter at bedside. Patient reports his appetite is improving now, but it has been poor for the past 2 weeks. He reports he has only been able to tolerate bites of food and sips of drink. He typically eats 2-3 meals per day when feeling well. Patient endorses diarrhea. Denies N/V or abdominal pain.  Patient refusing recommended oral nutrition supplements. He is amenable to eating snacks between meals. Patient is a picky eater by report. He does not like yogurt, peanut butter, or pudding. He is amenable to cottage cheese, sliced tomato, and cheese and crackers.  UBW 171 lbs. Patient reports he has lost 8 lbs (5% body weight) in 2 weeks, which is significant for time frame.  Medications reviewed and include: Vitamin D 1000 units daily, colace, Vitamin B12 1000 micrograms daily, NS @  75 ml/hr.  Labs reviewed: Potassium 3.2, CO2 21, BUN 35, Creatinine 1.86.   Nutrition-Focused physical exam completed. Findings are no fat depletion, no muscle depletion, and no edema.   Patient meets criteria for sever acute malnutrition in setting of 5% weight loss in 2 weeks, intake </= 50% estimated energy requirement for >/= 5 days.  Diet Order:  Diet Heart Room service appropriate? Yes; Fluid consistency: Thin  Skin:  Reviewed, no issues  Last BM:  10/20/2016  Height:   Ht Readings from Last 1 Encounters:  10/20/16 '5\' 4"'$  (1.626 m)    Weight:   Wt Readings from Last 1 Encounters:  10/20/16 166 lb (75.3 kg)    Ideal Body Weight:  59.1 kg  BMI:  Body mass index is 28.49 kg/m.  Estimated Nutritional Needs:   Kcal:  1700-1840 (HBE x 1.2-1.3)  Protein:  75-90 grams (1-1.2 grams/kg)  Fluid:  >/= 1.8 L/day (25 ml/kg)  EDUCATION NEEDS:   No education needs identified at this time  Willey Blade, MS, RD, LDN Pager: [phone] After Hours Pager: [phone]

## 2016-10-21 NOTE — Care Management Important Message (Signed)
Important Message  Patient Details  Name: Edwin Moran MRN: [id number] Date of Birth: 06/03/1941   Medicare Important Message Given:  Yes    Beverly Sessions, RN 10/21/2016, 3:28 PM

## 2016-10-21 NOTE — Progress Notes (Signed)
Anticoagulation monitoring(Lovenox):  75 yo ordered Lovenox. Currently receiving Lovenox '30mg'$  every 24 hours.   Filed Weights   10/20/16 1044  Weight: 166 lb (75.3 kg)    Lab Results  Component Value Date   CREATININE 1.86 (H) 10/21/2016   CREATININE 2.09 (H) 10/20/2016   CREATININE 2.40 (H) 09/16/2016   Estimated Creatinine Clearance: 32.3 mL/min (by C-G formula based on SCr of 1.86 mg/dL (H)). Hemoglobin & Hematocrit     Component Value Date/Time   HGB 12.4 (L) 10/21/2016 0443   HCT 35.3 (L) 10/21/2016 0443     Patient Crcl has improved and is now > 30 ml/min , will transition to Lovenox 40 mg Q24h.  Pernell Dupre, PharmD, BCPS Clinical Pharmacist 10/21/2016 11:27 AM

## 2016-10-21 NOTE — Consult Note (Addendum)
Pulmonary Critical Care  Initial Consult Note   Edwin Moran IDP:[id number] DOB: 1940-12-27 DOA: 10/20/2016  Referring physician: Bettey Costa, MD PCP: Elsie Stain, MD   Chief Complaint: Effusion  HPI: Edwin Moran is a 75 y.o. male with prior history of COPD adna  Smoker presented to the hospital with increased SOB. Patient was seen in the urology office and at that time was hypoxic. Patient has noted to have been more dyspneic and had been having increased tiredness. Patient was seen by his PCP and started on steroids. Apparently had undergone a uretral stent placement on 12/11 and was noted to have been getting worse. He also does admit to having cough which has been dry. No obvious sick contacts. No fevers noted   Review of Systems:  Constitutional:  No weight loss, night sweats, Fevers, chills, fatigue.  HEENT:  No headaches,   Cardio-vascular:  No chest pain, anasarca, dizziness, palpitations  GI:  No heartburn, indigestion, abdominal pain, nausea, vomiting, diarrhea  Resp:  +shortness of breath with exertion, No coughing up of blood Skin:  no rash or lesions.  Musculoskeletal:  No joint pain or swelling.   Remainder ROS performed and is unremarkable other than noted in HPI  Past Medical History:  Diagnosis Date  . Cancer ([place])    skin  . Complication of anesthesia 1963   pt woke up during the procedure choking on drainage.  Marland Kitchen COPD (chronic obstructive pulmonary disease) ([place])   . Diverticulosis of colon   . GERD (gastroesophageal reflux disease)   . History of GI diverticular bleed 03/20-03/31/04   [facility]  . History of hiatal hernia   . Hyperlipidemia   . Hypertension   . Osteoarthritis   . Peripheral vascular disease ([place])    right foot tunred purole  . PONV (postoperative nausea and vomiting)    at age 34 with finger surgery.  . Renal insufficiency   . Smoking    Past Surgical History:  Procedure Laterality Date  . COLONOSCOPY    . CYSTOSCOPY  1991   due  to prolonged prostatitis  . CYSTOSCOPY W/ RETROGRADES Bilateral 10/10/2016   Procedure: CYSTOSCOPY WITH RETROGRADE PYELOGRAM;  Surgeon: Hollice Espy, MD;  Location: [facility];  Service: Urology;  Laterality: Bilateral;  . CYSTOSCOPY WITH BIOPSY N/A 10/10/2016   Procedure: CYSTOSCOPY WITH BIOPSY;  Surgeon: Hollice Espy, MD;  Location: [facility];  Service: Urology;  Laterality: N/A;  . CYSTOSCOPY WITH STENT PLACEMENT Left 10/10/2016   Procedure: CYSTOSCOPY WITH STENT PLACEMENT;  Surgeon: Hollice Espy, MD;  Location: [facility];  Service: Urology;  Laterality: Left;  . 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 PERIPHERAL VASCULAR CATHETERIZATION Right 06/20/2016   Procedure: Lower Extremity Angiography;  Surgeon: Algernon Huxley, MD;  Location: [facility];  Service: Cardiovascular;  Laterality: Right;  . URETEROSCOPY Left 10/10/2016   Procedure: URETEROSCOPY;  Surgeon: Hollice Espy, MD;  Location: [facility];  Service: Urology;  Laterality: Left;  . WISDOM TOOTH EXTRACTION     Social History:  reports that he quit smoking about 2 months ago. He has a 40.00 pack-year smoking history. He has never used smokeless tobacco. He reports that he drinks about 12.6 - 16.8 oz of alcohol per week . He reports that he does not use drugs.  Allergies  Allergen Reactions  . Cefprozil     REACTION: Swelling  . Penicillins Swelling    Has patient had a PCN reaction causing  immediate rash, facial/tongue/throat swelling, SOB or lightheadedness with hypotension: Yes Has patient had a PCN reaction causing severe rash involving mucus membranes or skin necrosis: No Has patient had a PCN reaction that required hospitalization No Has patient had a PCN reaction occurring within the last 10 years: No If all of the above answers are "NO", then may proceed with Cephalosporin use.   Marland Kitchen Potassium Chloride     REACTION: SOB    Family History    Problem Relation Age of Onset  . Asthma Mother     chronic, asthma attack caused MI  . Heart disease Mother     MI  . Thrombophlebitis Mother   . Hiatal hernia Mother   . Prostate cancer Neg Hx   . Colon cancer Neg Hx     Prior to Admission medications   Medication Sig Start Date End Date Taking? Authorizing Provider  albuterol (PROVENTIL HFA;VENTOLIN HFA) 108 (90 Base) MCG/ACT inhaler Inhale 1-2 puffs into the lungs every 6 (six) hours as needed for wheezing or shortness of breath. 11/11/15  Yes Tonia Ghent, MD  amLODipine (NORVASC) 10 MG tablet TAKE 1 TABLET (10 MG TOTAL) BY MOUTH AT BEDTIME. 11/11/15  Yes Tonia Ghent, MD  aspirin EC 81 MG tablet Take 1 tablet (81 mg total) by mouth daily. 06/20/16  Yes Algernon Huxley, MD  bisoprolol-hydrochlorothiazide [facility]) 5-6.25 MG tablet Take 1 tablet by mouth daily. 11/11/15  Yes Tonia Ghent, MD  budesonide-formoterol [facility]) 160-4.5 MCG/ACT inhaler Inhale 2 puffs into the lungs 2 (two) times daily. 11/11/15  Yes Tonia Ghent, MD  cholecalciferol (VITAMIN D) 1000 UNITS tablet Take 1,000 Units by mouth daily.   Yes Historical Provider, MD  clopidogrel (PLAVIX) 75 MG tablet Take 1 tablet (75 mg total) by mouth daily. 06/20/16  Yes Algernon Huxley, MD  docusate sodium (COLACE) 100 MG capsule Take 1 capsule (100 mg total) by mouth 2 (two) times daily. 10/10/16  Yes Hollice Espy, MD  GLUCOSAMINE CHONDROITIN COMPLX PO Take 2 tablets by mouth daily.    Yes Historical Provider, MD  oxybutynin (DITROPAN) 5 MG tablet Take 1 tablet (5 mg total) by mouth every 8 (eight) hours as needed for bladder spasms. 10/10/16  Yes Hollice Espy, MD  quinapril (ACCUPRIL) 40 MG tablet TAKE 1 TABLET (40 MG TOTAL) BY MOUTH DAILY. 11/11/15  Yes Tonia Ghent, MD  vitamin B-12 (CYANOCOBALAMIN) 1000 MCG tablet Take 1,000 mcg by mouth daily.     Yes Historical Provider, MD  atorvastatin (LIPITOR) 20 MG tablet Take 1 tablet (20 mg total) by mouth daily. Patient not  taking: Reported on 10/20/2016 06/20/16   Algernon Huxley, MD  HYDROcodone-acetaminophen (NORCO/VICODIN) 5-325 MG tablet Take 1-2 tablets by mouth every 6 (six) hours as needed for moderate pain. Patient not taking: Reported on 10/20/2016 10/10/16   Hollice Espy, MD   Physical Exam: Vitals:   10/20/16 1649 10/20/16 1725 10/20/16 2109 10/21/16 0900  BP: 92/61  114/66 (!) 100/49  Pulse: 63  65 70  Resp: '18  18 20  '$ Temp:   97.5 F (36.4 C) 98.2 F (36.8 C)  TempSrc:   Oral Oral  SpO2: 96% 96% 97% 97%  Weight:      Height:        Wt Readings from Last 3 Encounters:  10/20/16 75.3 kg (166 lb)  10/20/16 75.3 kg (166 lb)  10/13/16 78.5 kg (173 lb)    General:  Appears calm and comfortable Eyes: PERRL,  normal lids, irises & conjunctiva ENT: grossly normal hearing, lips & tongue Neck: no LAD, masses or thyromegaly Cardiovascular: RRR, no m/r/g. No LE edema. Respiratory: CTA bilaterally, few rhonchi crackles. Normal respiratory effort. Abdomen: soft, nontender Skin: no rash or induration seen on limited exam Musculoskeletal: grossly normal tone BUE/BLE Psychiatric: grossly normal mood and affect Neurologic: grossly non-focal.          Labs on Admission:  Basic Metabolic Panel:  Recent Labs Lab 10/20/16 1046 10/21/16 0443  NA 137 138  K 3.5 3.2*  CL 103 107  CO2 24 21*  GLUCOSE 98 90  BUN 39* 35*  CREATININE 2.09* 1.86*  CALCIUM 8.6* 8.0*   Liver Function Tests: No results for input(s): AST, ALT, ALKPHOS, BILITOT, PROT, ALBUMIN in the last 168 hours.  Recent Labs Lab 10/20/16 1046  AMYLASE 49   No results for input(s): AMMONIA in the last 168 hours. CBC:  Recent Labs Lab 10/20/16 1046 10/21/16 0443  WBC 9.6 8.2  NEUTROABS 8.0*  --   HGB 12.8* 12.4*  HCT 36.6* 35.3*  MCV 96.1 95.6  PLT 255 220   Cardiac Enzymes:  Recent Labs Lab 10/20/16 1046  TROPONINI <0.03    BNP (last 3 results)  Recent Labs  10/20/16 1046  BNP 93.0    ProBNP (last 3  results) No results for input(s): PROBNP in the last 8760 hours.  CBG: No results for input(s): GLUCAP in the last 168 hours.  Radiological Exams on Admission: Dg Chest 1 View  Result Date: 10/20/2016 CLINICAL DATA:  Status post right-sided thoracentesis. EXAM: CHEST 1 VIEW COMPARISON:  CT scan of the chest of today's date FINDINGS: The volume of pleural fluid on the right has decreased significantly since the thoracentesis. There is no postprocedure pneumothorax. Both lungs are reasonably well expanded. The interstitial markings remain increased. There is confluent density in the left upper lobe consistent with pneumonia. The heart is normal in size. The pulmonary vascularity is not engorged. The mediastinum is normal in width. The trachea is midline. The bony thorax exhibits no acute abnormality. IMPRESSION: No postprocedure pneumothorax following right-sided thoracentesis. Electronically Signed   By: David  Martinique M.D.   On: 10/20/2016 16:43   Ct Angio Chest Pe W And/or Wo Contrast  Result Date: 10/20/2016 CLINICAL DATA:  Shortness of Breath EXAM: CT ANGIOGRAPHY CHEST WITH CONTRAST TECHNIQUE: Multidetector CT imaging of the chest was performed using the standard protocol during bolus administration of intravenous contrast. Multiplanar CT image reconstructions and MIPs were obtained to evaluate the vascular anatomy. CONTRAST:  75 mL Isovue 370 nonionic COMPARISON:  Chest radiograph October 04, 2016 and CT abdomen and pelvis September 26, 2016 FINDINGS: Cardiovascular: There is no demonstrable pulmonary embolus. There is no appreciable thoracic aortic aneurysm or dissection. There is moderate atherosclerotic calcification at the origins of the great vessels. There is moderate atherosclerotic calcification in the aorta. There is peripheral thrombus in portions of the descending thoracic aorta. There are multiple foci of coronary artery calcification. The pericardium is not appreciably thickened.  Mediastinum/Nodes: Thyroid appears unremarkable. There are multiple subcentimeter lymph nodes in the mediastinum. In the right axillary region, there is no adenopathy. However, there is soft tissue stranding in the right axillary region. There are mildly prominent lymph nodes in the aortopulmonary window region, largest measuring 1.5 x 1.0 cm. There is an enlarged lymph node immediately anterior to the carina on the right measuring 2.0 x 1.4 cm. There is a sub- carinal lymph node measuring 1.6 x  1.2 cm. Lungs/Pleura: There is a sizable free-flowing pleural effusion on the right. There is lower lobe region interstitial edema bilaterally. There are multiple foci of airspace consolidation bilaterally including in both lower lobes, primarily in the posterior segment left lower lobe, in the posterior segments of both upper lobes, in the anterior segment right upper lobe, and in both perihilar regions, more pronounced on the left than on the right. Previously noted 1 x 1 cm nodular opacity in the anterior segment of the right lower lobe is seen on axial slice 70 series 6, partially obscured by adjacent fluid and edema. Upper Abdomen: In the visualized upper abdomen, there is diffuse adrenal hypertrophy, stable. There is a cyst arising from the posterior upper pole right kidney measuring 2.7 x 2.1 cm. There is incomplete visualization of a cyst in the upper pole of the left kidney measuring 2.4 x 2.1 cm. There are multiple foci of atherosclerotic calcification in the aorta and major visualized mesenteric arteries. There is incomplete visualization of a 3 mm calculus in the upper pole of the right kidney. Liver contour raises concern for a degree of underlying hepatic cirrhosis. Musculoskeletal: There is degenerative change in the thoracic spine. There are no blastic or lytic bone lesions. Review of the MIP images confirms the above findings. IMPRESSION: No demonstrable pulmonary embolus. Evidence of a degree of congestive  heart failure. Note that there is interstitial edema and fairly sizable right pleural effusion. Multiple areas of alveolar opacity are felt to be most likely due to multifocal pneumonia, although some of this opacity may represent alveolar edema. Both edema and pneumonia can present concurrently. Previously noted 1 x 1 cm nodular opacity in the anterior segment right lower lobe persists. Small neoplasm in this area must be of concern. This finding may well warrant nuclear medicine PET study to further evaluate given its persistence and size. Several prominent lymph nodes. Question reactive versus neoplastic etiology. There is soft tissue stranding in the right axilla with several small lymph nodes in this area. 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 Areas of atherosclerotic calcification in the aorta. Multiple foci of coronary artery calcification evident. Stable bilateral adrenal hypertrophy. Nonobstructing calculus upper pole right kidney. Contour of liver suggests a degree of underlying cirrhosis. Electronically Signed   By: Lowella Grip III M.D.   On: 10/20/2016 13:21   US Thoracentesis Asp Pleural Space W/img Guide  Result Date: 10/20/2016 INDICATION: Shortness of breath and right pleural effusion. EXAM: ULTRASOUND GUIDED RIGHT THORACENTESIS MEDICATIONS: None. COMPLICATIONS: None immediate. PROCEDURE: An ultrasound guided thoracentesis was thoroughly discussed with the patient and questions answered. The benefits, risks, alternatives and complications were also discussed. The patient understands and wishes to proceed with the procedure. Written consent was obtained. Ultrasound was performed to localize and mark an adequate pocket of fluid in the right chest. The area was then prepped and draped in the normal sterile fashion. 1% Lidocaine was used for local anesthesia. Under ultrasound guidance a 6 Fr Safe-T-Centesis catheter was introduced. Thoracentesis was performed. The  catheter was removed and a dressing applied. FINDINGS: A total of approximately 800 mL of amber color fluid was removed. Samples were sent to the laboratory as requested by the clinical team. IMPRESSION: Successful ultrasound guided right thoracentesis yielding 800 mL of pleural fluid. Electronically Signed   By: Markus Daft M.D.   On: 10/20/2016 16:55      EKG: Independently reviewed.  Assessment/Plan Active Problems:   Pleural effusion   1. Pleural  effusion -patient has had thoracentesis done -Results of the labs noted shows elevated protein and low glucose with elevated LDH  2. Bilateral Interstitial process appears to be more c/w edema -now appears to be improved post thoracentesis -last echo was within normal limits in September -would consider re-evaluation for cardiac issues -I have also sent ILD workup last CXR done in 2015 did not show any interstitial prominance -would also get legeonella ag and mycoplasma  3. Pulmonary Nodule -will get workup done as an outpatient  4. Shortness of Breath -no clear cut history of COPD -was a smoker -agree with inhalers  Code Status: full code  Family Communication: none Disposition Plan: home  Time spent: 54mn  Follow up in office 1-2 weeks  I have personally obtained a history, examined the patient, evaluated laboratory and imaging results, formulated the assessment and plan and placed orders.  The Patient requires high complexity decision making for assessment and support.    SAllyne Gee MD FHeartland Surgical Spec HospitalPulmonary Critical Care Medicine Sleep Medicine

## 2016-10-21 NOTE — Progress Notes (Signed)
[place] [facility] [place] NAME: Edwyn Inclan    MR#:  [id number]  DATE OF BIRTH:  1941-02-10  SUBJECTIVE:   Patient is here due to shortness of breath secondary to a right-sided pleural effusion. Status ultrasound-guided thoracentesis yesterday with 800 cc of fluid removed. Feels better. No fever. Positive cough which is productive at clear sputum.  REVIEW OF SYSTEMS:    Review of Systems  Constitutional: Negative for chills and fever.  HENT: Negative for congestion and tinnitus.   Eyes: Negative for blurred vision and double vision.  Respiratory: Positive for cough, sputum production and shortness of breath. Negative for wheezing.   Cardiovascular: Negative for chest pain, orthopnea and PND.  Gastrointestinal: Negative for abdominal pain, diarrhea, nausea and vomiting.  Genitourinary: Negative for dysuria and hematuria.  Neurological: Negative for dizziness, sensory change and focal weakness.  All other systems reviewed and are negative.   Nutrition: Heart healthy Tolerating Diet: Yes Tolerating PT: Await eval.    DRUG ALLERGIES:   Allergies  Allergen Reactions  . Cefprozil     REACTION: Swelling  . Penicillins Swelling    Has patient had a PCN reaction causing immediate rash, facial/tongue/throat swelling, SOB or lightheadedness with hypotension: Yes Has patient had a PCN reaction causing severe rash involving mucus membranes or skin necrosis: No Has patient had a PCN reaction that required hospitalization No Has patient had a PCN reaction occurring within the last 10 years: No If all of the above answers are "NO", then may proceed with Cephalosporin use.   Marland Kitchen Potassium Chloride     REACTION: SOB    VITALS:  Blood pressure (!) 100/49, pulse 70, temperature 98.2 F (36.8 C), temperature source Oral, resp. rate 20, height '5\' 4"'$  (1.626 m), weight 75.3 kg (166 lb), SpO2 97 %.  PHYSICAL EXAMINATION:   Physical Exam  GENERAL:  75  y.o.-year-old patient lying in the bed in no acute distress.  EYES: Pupils equal, round, reactive to light and accommodation. No scleral icterus. Extraocular muscles intact.  HEENT: Head atraumatic, normocephalic. Oropharynx and nasopharynx clear.  NECK:  Supple, no jugular venous distention. No thyroid enlargement, no tenderness.  LUNGS: Normal breath sounds bilaterally, no wheezing, basilar rales, No rhonchi. No use of accessory muscles of respiration.  CARDIOVASCULAR: S1, S2 normal. No murmurs, rubs, or gallops.  ABDOMEN: Soft, nontender, nondistended. Bowel sounds present. No organomegaly or mass.  EXTREMITIES: No cyanosis, clubbing or edema b/l.    NEUROLOGIC: Cranial nerves II through XII are intact. No focal Motor or sensory deficits b/l.   PSYCHIATRIC: The patient is alert and oriented x 3.  SKIN: No obvious rash, lesion, or ulcer.    LABORATORY PANEL:   CBC  Recent Labs Lab 10/21/16 0443  WBC 8.2  HGB 12.4*  HCT 35.3*  PLT 220   ------------------------------------------------------------------------------------------------------------------  Chemistries   Recent Labs Lab 10/21/16 0443  NA 138  K 3.2*  CL 107  CO2 21*  GLUCOSE 90  BUN 35*  CREATININE 1.86*  CALCIUM 8.0*   ------------------------------------------------------------------------------------------------------------------  Cardiac Enzymes  Recent Labs Lab 10/20/16 1046  TROPONINI <0.03   ------------------------------------------------------------------------------------------------------------------  RADIOLOGY:  Dg Chest 1 View  Result Date: 10/20/2016 CLINICAL DATA:  Status post right-sided thoracentesis. EXAM: CHEST 1 VIEW COMPARISON:  CT scan of the chest of today's date FINDINGS: The volume of pleural fluid on the right has decreased significantly since the thoracentesis. There is no postprocedure pneumothorax. Both lungs are reasonably well expanded. The interstitial  markings remain  increased. There is confluent density in the left upper lobe consistent with pneumonia. The heart is normal in size. The pulmonary vascularity is not engorged. The mediastinum is normal in width. The trachea is midline. The bony thorax exhibits no acute abnormality. IMPRESSION: No postprocedure pneumothorax following right-sided thoracentesis. Electronically Signed   By: David  Martinique M.D.   On: 10/20/2016 16:43   Ct Angio Chest Pe W And/or Wo Contrast  Result Date: 10/20/2016 CLINICAL DATA:  Shortness of Breath EXAM: CT ANGIOGRAPHY CHEST WITH CONTRAST TECHNIQUE: Multidetector CT imaging of the chest was performed using the standard protocol during bolus administration of intravenous contrast. Multiplanar CT image reconstructions and MIPs were obtained to evaluate the vascular anatomy. CONTRAST:  75 mL Isovue 370 nonionic COMPARISON:  Chest radiograph October 04, 2016 and CT abdomen and pelvis September 26, 2016 FINDINGS: Cardiovascular: There is no demonstrable pulmonary embolus. There is no appreciable thoracic aortic aneurysm or dissection. There is moderate atherosclerotic calcification at the origins of the great vessels. There is moderate atherosclerotic calcification in the aorta. There is peripheral thrombus in portions of the descending thoracic aorta. There are multiple foci of coronary artery calcification. The pericardium is not appreciably thickened. Mediastinum/Nodes: Thyroid appears unremarkable. There are multiple subcentimeter lymph nodes in the mediastinum. In the right axillary region, there is no adenopathy. However, there is soft tissue stranding in the right axillary region. There are mildly prominent lymph nodes in the aortopulmonary window region, largest measuring 1.5 x 1.0 cm. There is an enlarged lymph node immediately anterior to the carina on the right measuring 2.0 x 1.4 cm. There is a sub- carinal lymph node measuring 1.6 x 1.2 cm. Lungs/Pleura: There is a sizable free-flowing  pleural effusion on the right. There is lower lobe region interstitial edema bilaterally. There are multiple foci of airspace consolidation bilaterally including in both lower lobes, primarily in the posterior segment left lower lobe, in the posterior segments of both upper lobes, in the anterior segment right upper lobe, and in both perihilar regions, more pronounced on the left than on the right. Previously noted 1 x 1 cm nodular opacity in the anterior segment of the right lower lobe is seen on axial slice 70 series 6, partially obscured by adjacent fluid and edema. Upper Abdomen: In the visualized upper abdomen, there is diffuse adrenal hypertrophy, stable. There is a cyst arising from the posterior upper pole right kidney measuring 2.7 x 2.1 cm. There is incomplete visualization of a cyst in the upper pole of the left kidney measuring 2.4 x 2.1 cm. There are multiple foci of atherosclerotic calcification in the aorta and major visualized mesenteric arteries. There is incomplete visualization of a 3 mm calculus in the upper pole of the right kidney. Liver contour raises concern for a degree of underlying hepatic cirrhosis. Musculoskeletal: There is degenerative change in the thoracic spine. There are no blastic or lytic bone lesions. Review of the MIP images confirms the above findings. IMPRESSION: No demonstrable pulmonary embolus. Evidence of a degree of congestive heart failure. Note that there is interstitial edema and fairly sizable right pleural effusion. Multiple areas of alveolar opacity are felt to be most likely due to multifocal pneumonia, although some of this opacity may represent alveolar edema. Both edema and pneumonia can present concurrently. Previously noted 1 x 1 cm nodular opacity in the anterior segment right lower lobe persists. Small neoplasm in this area must be of concern. This finding may well warrant nuclear medicine PET study  to further evaluate given its persistence and size. Several  prominent lymph nodes. Question reactive versus neoplastic etiology. There is soft tissue stranding in the right axilla with several small lymph nodes in this area. 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 Areas of atherosclerotic calcification in the aorta. Multiple foci of coronary artery calcification evident. Stable bilateral adrenal hypertrophy. Nonobstructing calculus upper pole right kidney. Contour of liver suggests a degree of underlying cirrhosis. Electronically Signed   By: Lowella Grip III M.D.   On: 10/20/2016 13:21   US Thoracentesis Asp Pleural Space W/img Guide  Result Date: 10/20/2016 INDICATION: Shortness of breath and right pleural effusion. EXAM: ULTRASOUND GUIDED RIGHT THORACENTESIS MEDICATIONS: None. COMPLICATIONS: None immediate. PROCEDURE: An ultrasound guided thoracentesis was thoroughly discussed with the patient and questions answered. The benefits, risks, alternatives and complications were also discussed. The patient understands and wishes to proceed with the procedure. Written consent was obtained. Ultrasound was performed to localize and mark an adequate pocket of fluid in the right chest. The area was then prepped and draped in the normal sterile fashion. 1% Lidocaine was used for local anesthesia. Under ultrasound guidance a 6 Fr Safe-T-Centesis catheter was introduced. Thoracentesis was performed. The catheter was removed and a dressing applied. FINDINGS: A total of approximately 800 mL of amber color fluid was removed. Samples were sent to the laboratory as requested by the clinical team. IMPRESSION: Successful ultrasound guided right thoracentesis yielding 800 mL of pleural fluid. Electronically Signed   By: Markus Daft M.D.   On: 10/20/2016 16:55     ASSESSMENT AND PLAN:   75 year old male with past medical history of COPD, GERD, hypertension, hyperlipidemia, osteoarthritis, peripheral vascular disease who presented to the hospital with  shortness of breath.  1. Acute respiratory failure with hypoxia-secondary to a right-sided pleural effusion. -Patient is status post ultrasound-guided thoracentesis. Clinically improved, wean O2 as tolerated.  2. Pleural effusion-patient is status post ultrasound-guided thoracentesis with 800 cc of fluid removed. -Fluid analysis is consistent with a negative effusion. Afebrile, white cell count normal. Hold off on antibiotics.  -Suspected to be malignant in nature given his pulmonary nodules. Await further pulmonary input  3. COPD-no acute exacerbation-continue Dulera, albuterol nebulizers as needed.  4. Essential hypertension-continue Norvasc, bisoprolol HCTZ  5. Hx of Urinary Incontinence - cont. Ditropan  6. Hx of PVD - cont. Plavix, Statin.    All the records are reviewed and case discussed with Care Management/Social Worker. Management plans discussed with the patient, family and they are in agreement.  CODE STATUS: Full code  DVT Prophylaxis: Lovenox  TOTAL TIME TAKING CARE OF THIS PATIENT: 30 minutes.   POSSIBLE D/C IN 1-2 DAYS, DEPENDING ON CLINICAL CONDITION.   Henreitta Leber M.D on 10/21/2016 at 2:37 PM  Between 7am to 6pm - Pager - [phone]  After 6pm go to [email or web address] - Proofreader  [facility]  Office  [phone]  CC: Primary care physician; Elsie Stain, MD

## 2016-10-22 ENCOUNTER — Inpatient Hospital Stay: Payer: Medicare Other

## 2016-10-22 DIAGNOSIS — Z87891 Personal history of nicotine dependence: Secondary | ICD-10-CM

## 2016-10-22 DIAGNOSIS — Z79899 Other long term (current) drug therapy: Secondary | ICD-10-CM

## 2016-10-22 DIAGNOSIS — K219 Gastro-esophageal reflux disease without esophagitis: Secondary | ICD-10-CM

## 2016-10-22 DIAGNOSIS — Z8719 Personal history of other diseases of the digestive system: Secondary | ICD-10-CM

## 2016-10-22 DIAGNOSIS — Z880 Allergy status to penicillin: Secondary | ICD-10-CM

## 2016-10-22 DIAGNOSIS — Z85828 Personal history of other malignant neoplasm of skin: Secondary | ICD-10-CM

## 2016-10-22 LAB — T4

## 2016-10-22 LAB — T3, FREE

## 2016-10-22 LAB — ANA W/REFLEX: ANA: NEGATIVE

## 2016-10-22 LAB — RHEUMATOID FACTOR: Rheumatoid fact SerPl-aCnc: <10 IU/mL (ref 0.0–13.9)

## 2016-10-22 MED ORDER — FUROSEMIDE 10 MG/ML IJ SOLN
20.0000 mg | INTRAMUSCULAR | Status: AC
  Administered 2016-10-22: 20 mg via INTRAVENOUS
  Filled 2016-10-22: qty 2

## 2016-10-22 MED ORDER — LORAZEPAM 2 MG/ML IJ SOLN
0.5000 mg | Freq: Once | INTRAMUSCULAR | Status: AC
  Administered 2016-10-22: 0.5 mg via INTRAVENOUS
  Filled 2016-10-22: qty 1

## 2016-10-22 MED ORDER — METHYLPREDNISOLONE SODIUM SUCC 40 MG IJ SOLR
40.0000 mg | Freq: Every day | INTRAMUSCULAR | Status: DC
  Administered 2016-10-22: 40 mg via INTRAVENOUS
  Filled 2016-10-22: qty 1

## 2016-10-22 MED ORDER — ALBUTEROL SULFATE (2.5 MG/3ML) 0.083% IN NEBU
2.5000 mg | INHALATION_SOLUTION | Freq: Four times a day (QID) | RESPIRATORY_TRACT | Status: DC
  Administered 2016-10-22 – 2016-10-25 (×13): 2.5 mg via RESPIRATORY_TRACT
  Filled 2016-10-22 (×13): qty 3

## 2016-10-22 MED ORDER — LEVALBUTEROL HCL 1.25 MG/0.5ML IN NEBU
1.2500 mg | INHALATION_SOLUTION | Freq: Once | RESPIRATORY_TRACT | Status: AC
  Administered 2016-10-22: 1.25 mg via RESPIRATORY_TRACT
  Filled 2016-10-22: qty 0.5

## 2016-10-22 MED ORDER — ZOLPIDEM TARTRATE 5 MG PO TABS
5.0000 mg | ORAL_TABLET | Freq: Once | ORAL | Status: AC
  Administered 2016-10-22: 5 mg via ORAL
  Filled 2016-10-22: qty 1

## 2016-10-22 MED ORDER — BUDESONIDE 0.5 MG/2ML IN SUSP
0.5000 mg | Freq: Two times a day (BID) | RESPIRATORY_TRACT | Status: DC
  Administered 2016-10-22 – 2016-10-26 (×8): 0.5 mg via RESPIRATORY_TRACT
  Filled 2016-10-22 (×8): qty 2

## 2016-10-22 MED ORDER — GADOBENATE DIMEGLUMINE 529 MG/ML IV SOLN
10.0000 mL | Freq: Once | INTRAVENOUS | Status: AC | PRN
  Administered 2016-10-22: 7 mL via INTRAVENOUS

## 2016-10-22 NOTE — Progress Notes (Signed)
Patient ID: Edwin Moran, male   DOB: [birth year], 75 y.o.   MRN: [id number]  [facility] PROGRESS NOTE  Edwin Moran XBD:[id number] DOB: [birth year] DOA: 10/20/2016 PCP: Elsie Stain, MD  HPI/Subjective: Patient still having shortness of breath. He did feel better if they draw fluid from his lung. Still cough and wheeze.  Objective: Vitals:   10/22/16 1043 10/22/16 1458  BP: (!) 102/58 105/63  Pulse:  66  Resp:  18  Temp:  98.1 F (36.7 C)    Filed Weights   10/20/16 1044  Weight: 75.3 kg (166 lb)    ROS: Review of Systems  Constitutional: Negative for chills and fever.  Eyes: Negative for blurred vision.  Respiratory: Positive for cough, shortness of breath and wheezing.   Cardiovascular: Negative for chest pain.  Gastrointestinal: Negative for abdominal pain, constipation, diarrhea, nausea and vomiting.  Genitourinary: Negative for dysuria.  Musculoskeletal: Negative for joint pain.  Neurological: Negative for dizziness and headaches.   Exam: Physical Exam  Constitutional: He is oriented to person, place, and time.  HENT:  Nose: No mucosal edema.  Mouth/Throat: No oropharyngeal exudate or posterior oropharyngeal edema.  Eyes: Conjunctivae, EOM and lids are normal. Pupils are equal, round, and reactive to light.  Neck: No JVD present. Carotid bruit is not present. No edema present. No thyroid mass and no thyromegaly present.  Cardiovascular: S1 normal and S2 normal.  Exam reveals no gallop.   No murmur heard. Pulses:      Dorsalis pedis pulses are 2+ on the right side, and 2+ on the left side.  Respiratory: Accessory muscle usage present. He is in respiratory distress. He has decreased breath sounds in the right middle field, the right lower field, the left middle field and the left lower field. He has wheezes in the right upper field, the right middle field and the left middle field. He has rhonchi in the left lower field. He has no rales.  GI: Soft. Bowel sounds  are normal. There is no tenderness.  Musculoskeletal:       Right ankle: He exhibits no swelling.       Left ankle: He exhibits no swelling.  Lymphadenopathy:    He has no cervical adenopathy.  Neurological: He is alert and oriented to person, place, and time. No cranial nerve deficit.  Skin: Skin is warm. No rash noted. Nails show no clubbing.  Psychiatric: He has a normal mood and affect.      Data Reviewed: Basic Metabolic Panel:  Recent Labs Lab 10/20/16 1046 10/21/16 0443  NA 137 138  K 3.5 3.2*  CL 103 107  CO2 24 21*  GLUCOSE 98 90  BUN 39* 35*  CREATININE 2.09* 1.86*  CALCIUM 8.6* 8.0*   L Recent Labs Lab 10/20/16 1046  AMYLASE 49   CBC:  Recent Labs Lab 10/20/16 1046 10/21/16 0443  WBC 9.6 8.2  NEUTROABS 8.0*  --   HGB 12.8* 12.4*  HCT 36.6* 35.3*  MCV 96.1 95.6  PLT 255 220   Cardiac Enzymes:  Recent Labs Lab 10/20/16 1046  TROPONINI <0.03   BNP (last 3 results)  Recent Labs  10/20/16 1046  BNP 93.0     Recent Results (from the past 240 hour(s))  Body fluid culture     Status: None (Preliminary result)   Collection Time: 10/20/16  4:30 PM  Result Value Ref Range Status   Specimen Description PLEURAL  Final   Special Requests NONE  Final   Gram Stain  Final    ABUNDANT WBC PRESENT,BOTH PMN AND MONONUCLEAR NO ORGANISMS SEEN    Culture   Final    NO GROWTH 2 DAYS Performed at [facility]    Report Status PENDING  Incomplete     Studies: Dg Chest 1 View  Result Date: 10/20/2016 CLINICAL DATA:  Status post right-sided thoracentesis. EXAM: CHEST 1 VIEW COMPARISON:  CT scan of the chest of today's date FINDINGS: The volume of pleural fluid on the right has decreased significantly since the thoracentesis. There is no postprocedure pneumothorax. Both lungs are reasonably well expanded. The interstitial markings remain increased. There is confluent density in the left upper lobe consistent with pneumonia. The heart is  normal in size. The pulmonary vascularity is not engorged. The mediastinum is normal in width. The trachea is midline. The bony thorax exhibits no acute abnormality. IMPRESSION: No postprocedure pneumothorax following right-sided thoracentesis. Electronically Signed   By: David  Martinique M.D.   On: 10/20/2016 16:43   US Thoracentesis Asp Pleural Space W/img Guide  Result Date: 10/20/2016 INDICATION: Shortness of breath and right pleural effusion. EXAM: ULTRASOUND GUIDED RIGHT THORACENTESIS MEDICATIONS: None. COMPLICATIONS: None immediate. PROCEDURE: An ultrasound guided thoracentesis was thoroughly discussed with the patient and questions answered. The benefits, risks, alternatives and complications were also discussed. The patient understands and wishes to proceed with the procedure. Written consent was obtained. Ultrasound was performed to localize and mark an adequate pocket of fluid in the right chest. The area was then prepped and draped in the normal sterile fashion. 1% Lidocaine was used for local anesthesia. Under ultrasound guidance a 6 Fr Safe-T-Centesis catheter was introduced. Thoracentesis was performed. The catheter was removed and a dressing applied. FINDINGS: A total of approximately 800 mL of amber color fluid was removed. Samples were sent to the laboratory as requested by the clinical team. IMPRESSION: Successful ultrasound guided right thoracentesis yielding 800 mL of pleural fluid. Electronically Signed   By: Markus Daft M.D.   On: 10/20/2016 16:55    Scheduled Meds: . albuterol  2.5 mg Nebulization Q6H  . amLODipine  10 mg Oral QHS  . aspirin EC  81 mg Oral Daily  . atorvastatin  20 mg Oral Daily  . azithromycin  250 mg Oral Daily  . budesonide (PULMICORT) nebulizer solution  0.5 mg Nebulization BID  . cholecalciferol  1,000 Units Oral Daily  . clopidogrel  75 mg Oral Daily  . docusate sodium  100 mg Oral BID  . enoxaparin (LOVENOX) injection  40 mg Subcutaneous Q24H  .  LORazepam  0.5 mg Intravenous Once  . methylPREDNISolone (SOLU-MEDROL) injection  40 mg Intravenous Daily  . mometasone-formoterol  2 puff Inhalation BID  . sodium chloride flush  3 mL Intravenous Q12H  . vitamin B-12  1,000 mcg Oral Daily   Continuous Infusions:  Assessment/Plan:  1. 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 MRI of the brain to rule out metastases. We'll need an outpatient PET CT scan. Appreciate oncology consultation. 2. COPD exacerbation start DuoNeb nebulizer standing dose, budesonide nebulizers and IV Solu-Medrol. Respiratory status will need to be better prior to disposition. 3. Acute respiratory failure with hypoxia see if we can get him off oxygen at this point we'll need to continue oxygen. 4. Relative hypotension. Stop antihypertensive medications 5. Chronic kidney disease stage III. Continue to monitor closely 6. Peripheral vascular disease on Plavix 7. Hyperlipidemia unspecified on atorvastatin 8. Weakness. Physical therapy consultation  Code Status:  Code Status Orders        Start     Ordered   10/20/16 1403  Full code  Continuous     10/20/16 1403    Code Status History    Date Active Date Inactive Code Status Order ID Comments User Context   06/20/2016  9:48 AM 06/20/2016  2:49 PM Full Code [id number]  Algernon Huxley, MD Inpatient    Advance Directive Documentation   Flowsheet Row Most Recent Value  Type of Advance Directive  Healthcare Power of Attorney, Living will  Pre-existing out of facility DNR order (yellow form or pink MOST form)  No data  "MOST" Form in Place?  No data     Family Communication: Daughter at the bedside Disposition Plan: To be determined  Consultants:  Pulmonary  Oncology  Procedures:  Thoracentesis  Antibiotics:  Zithromax  Time spent: 35 minutes. Confirmed full CODE STATUS.  Loletha Grayer  [employer]

## 2016-10-22 NOTE — Progress Notes (Signed)
Patient is short of breath and wheezing.  Not time for SVN's.  MD notified and gave order for xopenex and lasix once

## 2016-10-22 NOTE — Consult Note (Signed)
[place]  Telephone:([phone]) [phone] Fax:(336) [phone]  ID: Edwin Moran OB: 1941/01/12  MR#: [id number]  VZD#:[id number]  Patient Care Team: Tonia Ghent, MD as PCP - General (Family Medicine)  CHIEF COMPLAINT: Increasing shortness of breath, malignant pleural effusion.  INTERVAL HISTORY: Patient is a 75 year old male who noted worsening shortness of breath and dyspnea on exertion. He also had increasing weakness and fatigue. Upon evaluation at his urologist, he was noted to have an oxygen saturation of less than 90% and was sent to the emergency room for further evaluation. CT scan noted a sizable right pleural effusion, mediastinal lymphadenopathy, and a 1 cm mass in the right lower lobe. 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 Currently, patient remained short of breath but is improved since admission. He has no neurologic complaints. He denies any recent fevers. He has a good appetite and denies weight loss. He denies any chest pain or hemoptysis. He has no nausea, vomiting, consultation, or diarrhea. He has no urinary complaints. Patient otherwise feels well and offers no further specific complaints.   REVIEW OF SYSTEMS:   Review of Systems  Constitutional: Positive for malaise/fatigue. Negative for fever and weight loss.  Respiratory: Positive for cough and shortness of breath. Negative for hemoptysis.   Cardiovascular: Negative.  Negative for chest pain and leg swelling.  Gastrointestinal: Negative.  Negative for abdominal pain.  Genitourinary: Negative.   Musculoskeletal: Negative.   Neurological: Positive for weakness.  Psychiatric/Behavioral: Negative.  The patient is not nervous/anxious.     As per HPI. Otherwise, a complete review of systems is negative.  PAST MEDICAL HISTORY: Past Medical History:  Diagnosis Date  . Cancer ([place])    skin  . Complication of anesthesia 1963   pt woke up during the procedure  choking on drainage.  Marland Kitchen COPD (chronic obstructive pulmonary disease) ([place])   . Diverticulosis of colon   . GERD (gastroesophageal reflux disease)   . History of GI diverticular bleed 03/20-03/31/04   [facility]  . History of hiatal hernia   . Hyperlipidemia   . Hypertension   . Osteoarthritis   . Peripheral vascular disease ([place])    right foot tunred purole  . PONV (postoperative nausea and vomiting)    at age 9 with finger surgery.  . Renal insufficiency   . Smoking     PAST SURGICAL HISTORY: Past Surgical History:  Procedure Laterality Date  . COLONOSCOPY    . CYSTOSCOPY  1991   due to prolonged prostatitis  . CYSTOSCOPY W/ RETROGRADES Bilateral 10/10/2016   Procedure: CYSTOSCOPY WITH RETROGRADE PYELOGRAM;  Surgeon: Hollice Espy, MD;  Location: [facility];  Service: Urology;  Laterality: Bilateral;  . CYSTOSCOPY WITH BIOPSY N/A 10/10/2016   Procedure: CYSTOSCOPY WITH BIOPSY;  Surgeon: Hollice Espy, MD;  Location: [facility];  Service: Urology;  Laterality: N/A;  . CYSTOSCOPY WITH STENT PLACEMENT Left 10/10/2016   Procedure: CYSTOSCOPY WITH STENT PLACEMENT;  Surgeon: Hollice Espy, MD;  Location: [facility];  Service: Urology;  Laterality: Left;  . 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 PERIPHERAL VASCULAR CATHETERIZATION Right 06/20/2016   Procedure: Lower Extremity Angiography;  Surgeon: Algernon Huxley, MD;  Location: [facility];  Service: Cardiovascular;  Laterality: Right;  . URETEROSCOPY Left 10/10/2016   Procedure: URETEROSCOPY;  Surgeon: Hollice Espy, MD;  Location: [facility];  Service: Urology;  Laterality: Left;  . WISDOM TOOTH EXTRACTION      FAMILY HISTORY: Family  History  Problem Relation Age of Onset  . Asthma Mother     chronic, asthma attack caused MI  . Heart disease Mother     MI  . Thrombophlebitis Mother   . Hiatal hernia Mother   . Prostate cancer Neg Hx   . Colon cancer Neg  Hx     ADVANCED DIRECTIVES (Y/N):  '@ADVDIR'$ @  HEALTH MAINTENANCE: Social History  Substance Use Topics  . Smoking status: Former Smoker    Packs/day: 1.00    Years: 40.00    Quit date: 07/31/2016  . Smokeless tobacco: Never Used     Comment: off and on for 40 years, 1 PPD at first  . Alcohol use 12.6 - 16.8 oz/week    14 Standard drinks or equivalent, 7 - 14 Shots of liquor per week     Comment: couple of drinks a day     Colonoscopy:  PAP:  Bone density:  Lipid panel:  Allergies  Allergen Reactions  . Cefprozil     REACTION: Swelling  . Penicillins Swelling    Has patient had a PCN reaction causing immediate rash, facial/tongue/throat swelling, SOB or lightheadedness with hypotension: Yes Has patient had a PCN reaction causing severe rash involving mucus membranes or skin necrosis: No Has patient had a PCN reaction that required hospitalization No Has patient had a PCN reaction occurring within the last 10 years: No If all of the above answers are "NO", then may proceed with Cephalosporin use.   Marland Kitchen Potassium Chloride     REACTION: SOB    Current Facility-Administered Medications  Medication Dose Route Frequency Provider Last Rate Last Dose  . 0.9 %  sodium chloride infusion   Intravenous Continuous Bettey Costa, MD 75 mL/hr at 10/22/16 1120    . acetaminophen (TYLENOL) tablet 650 mg  650 mg Oral Q6H PRN Bettey Costa, MD       Or  . acetaminophen (TYLENOL) suppository 650 mg  650 mg Rectal Q6H PRN Bettey Costa, MD      . albuterol (PROVENTIL) (2.5 MG/3ML) 0.083% nebulizer solution 2.5 mg  2.5 mg Nebulization Q6H PRN Lenis Noon, RPH   2.5 mg at 10/22/16 2841  . amLODipine (NORVASC) tablet 10 mg  10 mg Oral QHS Bettey Costa, MD   10 mg at 10/21/16 2100  . aspirin EC tablet 81 mg  81 mg Oral Daily Bettey Costa, MD   81 mg at 10/22/16 0804  . atorvastatin (LIPITOR) tablet 20 mg  20 mg Oral Daily Bettey Costa, MD   20 mg at 10/22/16 0806  . azithromycin (ZITHROMAX) tablet 250 mg  250  mg Oral Daily Allyne Gee, MD   250 mg at 10/22/16 3244  . bisoprolol-hydrochlorothiazide (ZIAC) 5-6.25 MG per tablet 1 tablet  1 tablet Oral Daily Henreitta Leber, MD   1 tablet at 10/21/16 1233  . cholecalciferol (VITAMIN D) tablet 1,000 Units  1,000 Units Oral Daily Bettey Costa, MD   1,000 Units at 10/22/16 0805  . clopidogrel (PLAVIX) tablet 75 mg  75 mg Oral Daily Bettey Costa, MD   75 mg at 10/22/16 0805  . docusate sodium (COLACE) capsule 100 mg  100 mg Oral BID Bettey Costa, MD      . enoxaparin (LOVENOX) injection 40 mg  40 mg Subcutaneous Q24H Sheema M Hallaji, RPH   40 mg at 10/21/16 2100  . HYDROcodone-acetaminophen (NORCO/VICODIN) 5-325 MG per tablet 1-2 tablet  1-2 tablet Oral Q4H PRN Bettey Costa, MD      .  lisinopril (PRINIVIL,ZESTRIL) tablet 40 mg  40 mg Oral QHS Lenis Noon, RPH   40 mg at 10/21/16 2100  . mometasone-formoterol (DULERA) 200-5 MCG/ACT inhaler 2 puff  2 puff Inhalation BID Bettey Costa, MD   2 puff at 10/22/16 0739  . oxybutynin (DITROPAN) tablet 5 mg  5 mg Oral Q8H PRN Bettey Costa, MD      . senna-docusate (Senokot-S) tablet 1 tablet  1 tablet Oral QHS PRN Bettey Costa, MD      . sodium chloride flush (NS) 0.9 % injection 3 mL  3 mL Intravenous Q12H Bettey Costa, MD   3 mL at 10/22/16 0809  . vitamin B-12 (CYANOCOBALAMIN) tablet 1,000 mcg  1,000 mcg Oral Daily Bettey Costa, MD   1,000 mcg at 10/22/16 0808    OBJECTIVE: Vitals:   10/22/16 0801 10/22/16 1043  BP: 110/62 (!) 102/58  Pulse:    Resp:    Temp:       Body mass index is 28.49 kg/m.    ECOG FS:1 - Symptomatic but completely ambulatory  General: Well-developed, well-nourished, no acute distress. Eyes: Pink conjunctiva, anicteric sclera. HEENT: Normocephalic, moist mucous membranes, clear oropharnyx. Lungs: Diminished right lung breath sounds.  Heart: Regular rate and rhythm. No rubs, murmurs, or gallops. Abdomen: Soft, nontender, nondistended. No organomegaly noted, normoactive bowel  sounds. Musculoskeletal: No edema, cyanosis, or clubbing. Neuro: Alert, answering all questions appropriately. Cranial nerves grossly intact. Skin: No rashes or petechiae noted. Psych: Normal affect. Lymphatics: No cervical, calvicular, axillary or inguinal LAD.   LAB RESULTS:  Lab Results  Component Value Date   NA 138 10/21/2016   K 3.2 (L) 10/21/2016   CL 107 10/21/2016   CO2 21 (L) 10/21/2016   GLUCOSE 90 10/21/2016   BUN 35 (H) 10/21/2016   CREATININE 1.86 (H) 10/21/2016   CALCIUM 8.0 (L) 10/21/2016   PROT 8.2 (H) 08/27/2016   ALBUMIN 4.0 08/27/2016   AST 15 08/27/2016   ALT 17 08/27/2016   ALKPHOS 112 08/27/2016   BILITOT 0.7 08/27/2016   GFRNONAA 34 (L) 10/21/2016   GFRAA 39 (L) 10/21/2016    Lab Results  Component Value Date   WBC 8.2 10/21/2016   NEUTROABS 8.0 (H) 10/20/2016   HGB 12.4 (L) 10/21/2016   HCT 35.3 (L) 10/21/2016   MCV 95.6 10/21/2016   PLT 220 10/21/2016     STUDIES: Ct Abdomen Pelvis Wo Contrast  Result Date: 09/26/2016 CLINICAL DATA:  Pt c/o having an episode of gross painless hematuria 1 month ago which has not recurred. He was started on Plavix for PVD several months ago. Hx Left inguinal stent. No hx kidney stones. NKI. No hx CA. EXAM: CT ABDOMEN AND PELVIS WITHOUT CONTRAST TECHNIQUE: Multidetector CT imaging of the abdomen and pelvis was performed following the standard protocol without IV contrast. COMPARISON:  08/27/2016 FINDINGS: Lower chest: Right lower lobe pulmonary nodule is 8 x 10 mm (mean 9 mm) on image 3 of series 3. Reticular changes are identified in the lung bases bilaterally, similar in appearance to prior study. No pleural effusions. Coronary artery calcifications are present. Hepatobiliary: The liver appears nodular raising the question of cirrhosis. No focal hepatic mass. Gallbladder is present. Pancreas: Unremarkable. No pancreatic ductal dilatation or surrounding inflammatory changes. Spleen: Normal in size without focal  abnormality. Adrenals/Urinary Tract: Prominent adrenal glands bilaterally, similar in appearance to prior study. There is moderate left hydronephrosis and left hydroureter, as seen on the prior study. No ureteral stones are identified. Nodule previously noted  in the region of the left upper pole collecting systems not seen on the current study. There are multiple low-attenuation lesions within the kidneys bilaterally consistent with cyst. Calcifications within the renal pelvis bilaterally are consistent with vascular calcifications or possible small stones. No evidence for hydronephrosis or hydroureter on the right. Stomach/Bowel: The stomach and small bowel loops are normal in appearance. There is significant diverticular disease of the colon not associated with acute inflammation. The appendix is well seen and has a normal appearance. Vascular/Lymphatic: There is dense atherosclerotic calcification of the abdominal aorta and its branches. No aneurysm. Maximum diameter is 2.6 cm. Small periaortic lymph nodes appear stable, largest in the left periaortic region measuring 1.0 cm on image 29 of series 2. Reproductive: The prostate is enlarged, creating a a prominent impression on the base of the bladder. Other: There is a small amount free pelvic fluid. Musculoskeletal: Significant spondylosis of the lumbar spine, associated with endplate sclerosis. No discrete sclerotic lesions are identified. IMPRESSION: 1. Persistent right lower lobe pulmonary nodule, measuring slightly smaller possibly related to differences in imaging technique. Given its persistence, consider follow-up PET-CT or tissue sampling. 2. Stable appearance of small periaortic lymph nodes. These could be further evaluated at that time a PET-CT. 3. Persistent dilatation of the left renal collecting system without radiopaque calculi. Consider urothelial lesion as a cause for obstruction. Electronically Signed   By: Nolon Nations M.D.   On: 09/26/2016  16:58   Dg Chest 1 View  Result Date: 10/20/2016 CLINICAL DATA:  Status post right-sided thoracentesis. EXAM: CHEST 1 VIEW COMPARISON:  CT scan of the chest of today's date FINDINGS: The volume of pleural fluid on the right has decreased significantly since the thoracentesis. There is no postprocedure pneumothorax. Both lungs are reasonably well expanded. The interstitial markings remain increased. There is confluent density in the left upper lobe consistent with pneumonia. The heart is normal in size. The pulmonary vascularity is not engorged. The mediastinum is normal in width. The trachea is midline. The bony thorax exhibits no acute abnormality. IMPRESSION: No postprocedure pneumothorax following right-sided thoracentesis. Electronically Signed   By: David  [place]ique M.D.   On: 10/20/2016 16:43   Dg Chest 2 View  Result Date: 10/04/2016 CLINICAL DATA:  Wheezing. EXAM: CHEST  2 VIEW COMPARISON:  CT 09/26/2016.  Chest x-ray 03/10/ 2015. FINDINGS: Mediastinum and hilar structures are normal. Borderline cardiomegaly. Diffuse bilateral from interstitial prominence noted consistent with interstitial edema and/or pneumonitis. Small bilateral pleural effusions noted. Previously identified right base pulmonary nodule not identified due to overlying acute interstitial process. No acute bony abnormality . IMPRESSION: 1. Mild cardiomegaly. Mild bilateral from interstitial prominence consist with interstitial edema and/or pneumonitis. Small bilateral pleural effusions. 2. Previously identified small stable pulmonary nodule in the right lung base is not identified due to overlying acute interstitial process. Electronically Signed   By: [place]   On: 10/04/2016 11:01   Ct Angio Chest Pe W And/or Wo Contrast  Result Date: 10/20/2016 CLINICAL DATA:  Shortness of Breath EXAM: CT ANGIOGRAPHY CHEST WITH CONTRAST TECHNIQUE: Multidetector CT imaging of the chest was performed using the standard protocol during  bolus administration of intravenous contrast. Multiplanar CT image reconstructions and MIPs were obtained to evaluate the vascular anatomy. CONTRAST:  75 mL Isovue 370 nonionic COMPARISON:  Chest radiograph October 04, 2016 and CT abdomen and pelvis September 26, 2016 FINDINGS: Cardiovascular: There is no demonstrable pulmonary embolus. There is no appreciable thoracic aortic aneurysm or dissection. There is moderate  atherosclerotic calcification at the origins of the great vessels. There is moderate atherosclerotic calcification in the aorta. There is peripheral thrombus in portions of the descending thoracic aorta. There are multiple foci of coronary artery calcification. The pericardium is not appreciably thickened. Mediastinum/Nodes: Thyroid appears unremarkable. There are multiple subcentimeter lymph nodes in the mediastinum. In the right axillary region, there is no adenopathy. However, there is soft tissue stranding in the right axillary region. There are mildly prominent lymph nodes in the aortopulmonary window region, largest measuring 1.5 x 1.0 cm. There is an enlarged lymph node immediately anterior to the carina on the right measuring 2.0 x 1.4 cm. There is a sub- carinal lymph node measuring 1.6 x 1.2 cm. Lungs/Pleura: There is a sizable free-flowing pleural effusion on the right. There is lower lobe region interstitial edema bilaterally. There are multiple foci of airspace consolidation bilaterally including in both lower lobes, primarily in the posterior segment left lower lobe, in the posterior segments of both upper lobes, in the anterior segment right upper lobe, and in both perihilar regions, more pronounced on the left than on the right. Previously noted 1 x 1 cm nodular opacity in the anterior segment of the right lower lobe is seen on axial slice 70 series 6, partially obscured by adjacent fluid and edema. Upper Abdomen: In the visualized upper abdomen, there is diffuse adrenal hypertrophy,  stable. There is a cyst arising from the posterior upper pole right kidney measuring 2.7 x 2.1 cm. There is incomplete visualization of a cyst in the upper pole of the left kidney measuring 2.4 x 2.1 cm. There are multiple foci of atherosclerotic calcification in the aorta and major visualized mesenteric arteries. There is incomplete visualization of a 3 mm calculus in the upper pole of the right kidney. Liver contour raises concern for a degree of underlying hepatic cirrhosis. Musculoskeletal: There is degenerative change in the thoracic spine. There are no blastic or lytic bone lesions. Review of the MIP images confirms the above findings. IMPRESSION: No demonstrable pulmonary embolus. Evidence of a degree of congestive heart failure. Note that there is interstitial edema and fairly sizable right pleural effusion. Multiple areas of alveolar opacity are felt to be most likely due to multifocal pneumonia, although some of this opacity may represent alveolar edema. Both edema and pneumonia can present concurrently. Previously noted 1 x 1 cm nodular opacity in the anterior segment right lower lobe persists. Small neoplasm in this area must be of concern. This finding may well warrant nuclear medicine PET study to further evaluate given its persistence and size. Several prominent lymph nodes. Question reactive versus neoplastic etiology. There is soft tissue stranding in the right axilla with several small lymph nodes in this area. 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 Areas of atherosclerotic calcification in the aorta. Multiple foci of coronary artery calcification evident. Stable bilateral adrenal hypertrophy. Nonobstructing calculus upper pole right kidney. Contour of liver suggests a degree of underlying cirrhosis. Electronically Signed   By: Lowella Grip III M.D.   On: 10/20/2016 13:21   US Thoracentesis Asp Pleural Space W/img Guide  Result Date: 10/20/2016 INDICATION:  Shortness of breath and right pleural effusion. EXAM: ULTRASOUND GUIDED RIGHT THORACENTESIS MEDICATIONS: None. COMPLICATIONS: None immediate. PROCEDURE: An ultrasound guided thoracentesis was thoroughly discussed with the patient and questions answered. The benefits, risks, alternatives and complications were also discussed. The patient understands and wishes to proceed with the procedure. Written consent was obtained. Ultrasound was performed to localize and  mark an adequate pocket of fluid in the right chest. The area was then prepped and draped in the normal sterile fashion. 1% Lidocaine was used for local anesthesia. Under ultrasound guidance a 6 Fr Safe-T-Centesis catheter was introduced. Thoracentesis was performed. The catheter was removed and a dressing applied. FINDINGS: A total of approximately 800 mL of amber color fluid was removed. Samples were sent to the laboratory as requested by the clinical team. IMPRESSION: Successful ultrasound guided right thoracentesis yielding 800 mL of pleural fluid. Electronically Signed   By: Markus Daft M.D.   On: 10/20/2016 16:55    ASSESSMENT: Malignant pleural effusion.  PLAN:    1. Malignant pleural effusion: Unclear if this is lung primary or possibly metastatic disease. Patient actively being worked up by urology for gross hematuria. He has a left ureteral mass as well as some borderline periaortic lymphadenopathy. Pleural effusion consistent with adenocarcinoma. Patient might require an additional biopsy as well as additional testing to determine whether this is primary lung malignancy or metastatic disease. He also may require Pleurx catheter depending on how quickly the malignant pleural effusion reaccumulates. Will get an MRI of the brain to complete the staging workup. Patient will also require a PET scan, but this can be accomplished as an outpatient. Please ensure patient has follow-up in the [place] 1-2 weeks after discharge for further evaluation  and additional diagnostic testing if necessary. We will also initiate treatment planning discussions at that visit.  Appreciate consult, call with questions.  Lloyd Huger, MD   10/22/2016 1:49 PM

## 2016-10-23 LAB — BASIC METABOLIC PANEL
ANION GAP: 8 (ref 5–15)
BUN: 26 mg/dL — ABNORMAL HIGH (ref 6–20)
CO2: 22 mmol/L (ref 22–32)
Calcium: 7.9 mg/dL — ABNORMAL LOW (ref 8.9–10.3)
Chloride: 109 mmol/L (ref 101–111)
Creatinine, Ser: 1.95 mg/dL — ABNORMAL HIGH (ref 0.61–1.24)
GFR, EST AFRICAN AMERICAN: 37 mL/min — AB (ref >60)
GFR, EST NON AFRICAN AMERICAN: 32 mL/min — AB (ref >60)
Glucose, Bld: 92 mg/dL (ref 65–99)
POTASSIUM: 2.9 mmol/L — AB (ref 3.5–5.1)
SODIUM: 139 mmol/L (ref 135–145)

## 2016-10-23 MED ORDER — NYSTATIN 100000 UNIT/ML MT SUSP
5.0000 mL | Freq: Four times a day (QID) | OROMUCOSAL | Status: DC
  Administered 2016-10-23 – 2016-10-26 (×13): 500000 [IU] via ORAL
  Filled 2016-10-23 (×12): qty 5

## 2016-10-23 MED ORDER — METHYLPREDNISOLONE SODIUM SUCC 40 MG IJ SOLR
40.0000 mg | Freq: Three times a day (TID) | INTRAMUSCULAR | Status: DC
  Administered 2016-10-23 – 2016-10-25 (×7): 40 mg via INTRAVENOUS
  Filled 2016-10-23 (×7): qty 1

## 2016-10-23 NOTE — Progress Notes (Signed)
Patient ID: Thos Matsumoto, male   DOB: 01/10/1941, 75 y.o.   MRN: [id number]  [facility] PROGRESS NOTE  Ryan Ogborn CHE:[id number] DOB: Sep 18, 1941 DOA: 10/20/2016 PCP: Elsie Stain, MD  HPI/Subjective: Patient still having shortness of breath with limited movement. Still with some cough and wheeze. Not feeling well at all.  Objective: Vitals:   10/23/16 0558 10/23/16 1309  BP: 102/62 (!) 108/58  Pulse: 72 68  Resp: 20 18  Temp: 98 F (36.7 C) 97.6 F (36.4 C)    Filed Weights   10/20/16 1044  Weight: 75.3 kg (166 lb)    ROS: Review of Systems  Constitutional: Negative for chills and fever.  Eyes: Negative for blurred vision.  Respiratory: Positive for cough, shortness of breath and wheezing.   Cardiovascular: Negative for chest pain.  Gastrointestinal: Negative for abdominal pain, constipation, diarrhea, nausea and vomiting.  Genitourinary: Negative for dysuria.  Musculoskeletal: Negative for joint pain.  Neurological: Negative for dizziness and headaches.   Exam: Physical Exam  Constitutional: He is oriented to person, place, and time.  HENT:  Nose: No mucosal edema.  Mouth/Throat: No oropharyngeal exudate or posterior oropharyngeal edema.  Eyes: Conjunctivae, EOM and lids are normal. Pupils are equal, round, and reactive to light.  Neck: No JVD present. Carotid bruit is not present. No edema present. No thyroid mass and no thyromegaly present.  Cardiovascular: S1 normal and S2 normal.  Exam reveals no gallop.   No murmur heard. Pulses:      Dorsalis pedis pulses are 2+ on the right side, and 2+ on the left side.  Respiratory: Accessory muscle usage present. He is in respiratory distress. He has decreased breath sounds in the right middle field, the right lower field, the left middle field and the left lower field. He has wheezes in the right upper field, the right middle field and the left middle field. He has rhonchi in the left lower field. He has no rales.   GI: Soft. Bowel sounds are normal. There is no tenderness.  Musculoskeletal:       Right ankle: He exhibits no swelling.       Left ankle: He exhibits no swelling.  Lymphadenopathy:    He has no cervical adenopathy.  Neurological: He is alert and oriented to person, place, and time. No cranial nerve deficit.  Skin: Skin is warm. No rash noted. Nails show no clubbing.  Psychiatric: He has a normal mood and affect.      Data Reviewed: Basic Metabolic Panel:  Recent Labs Lab 10/20/16 1046 10/21/16 0443 10/23/16 0458  NA 137 138 139  K 3.5 3.2* 2.9*  CL 103 107 109  CO2 24 21* 22  GLUCOSE 98 90 92  BUN 39* 35* 26*  CREATININE 2.09* 1.86* 1.95*  CALCIUM 8.6* 8.0* 7.9*   L  Recent Labs Lab 10/20/16 1046  AMYLASE 49   CBC:  Recent Labs Lab 10/20/16 1046 10/21/16 0443  WBC 9.6 8.2  NEUTROABS 8.0*  --   HGB 12.8* 12.4*  HCT 36.6* 35.3*  MCV 96.1 95.6  PLT 255 220   Cardiac Enzymes:  Recent Labs Lab 10/20/16 1046  TROPONINI <0.03   BNP (last 3 results)  Recent Labs  10/20/16 1046  BNP 93.0     Recent Results (from the past 240 hour(s))  Body fluid culture     Status: None (Preliminary result)   Collection Time: 10/20/16  4:30 PM  Result Value Ref Range Status   Specimen Description PLEURAL  Final   Special Requests NONE  Final   Gram Stain   Final    ABUNDANT WBC PRESENT,BOTH PMN AND MONONUCLEAR NO ORGANISMS SEEN    Culture   Final    NO GROWTH 3 DAYS Performed at [facility]    Report Status PENDING  Incomplete     Studies: Mr Jeri Cos Wo Contrast  Result Date: 10/22/2016 CLINICAL DATA:  75 year old hypertensive male with hyperlipidemia presenting with weakness and fatigue. Recent diagnosis of lung cancer. Initial encounter. EXAM: MRI HEAD WITHOUT AND WITH CONTRAST TECHNIQUE: Multiplanar, multiecho pulse sequences of the brain and surrounding structures were obtained without and with intravenous contrast. CONTRAST:  21m MULTIHANCE  GADOBENATE DIMEGLUMINE 529 MG/ML IV SOLN COMPARISON:  None. 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 No intracranial hemorrhage. No intracranial enhancing lesion or bony destructive lesion to suggest presence of intracranial metastatic disease. Mild to moderate chronic microvascular changes. Mild moderate global atrophy without hydrocephalus. Vascular: Major intracranial vascular structures are patent. 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 Sinuses/Orbits: Post lens replacement without acute orbital abnormality. Air-fluid level left maxillary sinus may indicate acute sinusitis. Diminutive size right maxillary sinus. Mild mucosal thickening ethmoid sinus air cells and left frontal sinus. Other: Possible right upper neck cystic adenopathy. CT of the neck recommended for further delineation. 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 No evidence of intracranial metastatic disease. Mild to moderate chronic microvascular changes. Mild to moderate global atrophy without hydrocephalus. Possible right upper neck cystic adenopathy. CT of the neck recommended for further delineation. 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 This can be further assessed with cervical spine MR if clinically desired. Air-fluid level left maxillary sinus may indicate acute sinusitis. Diminutive size right maxillary sinus. Mild mucosal thickening ethmoid sinus air cells and left frontal sinus. Electronically Signed   By: SGenia DelM.D.   On: 10/22/2016 18:39    Scheduled Meds: . albuterol  2.5 mg Nebulization Q6H  . aspirin EC  81 mg Oral Daily  . atorvastatin  20 mg Oral Daily  . azithromycin  250 mg Oral Daily  . budesonide (PULMICORT) nebulizer solution  0.5 mg Nebulization BID  . cholecalciferol  1,000 Units Oral Daily  .  clopidogrel  75 mg Oral Daily  . docusate sodium  100 mg Oral BID  . enoxaparin (LOVENOX) injection  40 mg Subcutaneous Q24H  . methylPREDNISolone (SOLU-MEDROL) injection  40 mg Intravenous Q8H  . nystatin  5 mL Oral QID  . sodium chloride flush  3 mL Intravenous Q12H  . vitamin B-12  1,000 mcg Oral Daily    Assessment/Plan:  1. 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 MRI of the brain Negative for metastases. We'll need an outpatient PET CT scan. Appreciate oncology consultation. Overall prognosis is poor and patient is a full code. 2. COPD exacerbation start DuoNeb nebulizer standing dose, budesonide nebulizers and IV Solu-Medrol. Respiratory status will need to be better prior to disposition. 3. Acute respiratory failure with hypoxia. Pulse ox better off oxygen 4. Relative hypotension. Stop antihypertensive medications 5. Chronic kidney disease stage III. Continue to monitor closely 6. Peripheral vascular disease on Plavix 7. Hyperlipidemia unspecified on atorvastatin 8. Weakness. Physical therapy consultation appreciated 9. Acute stroke seen on MRI of the brain in the right occipital lobe. Patient not having any visual or motor symptoms. Continue Plavix.  Get an ultrasound of the carotids 10. Cirrhosis of the liver seen on CT scan of the chest. Send hepatitis profiles. Daughter states the patient is a heavy drinker.  Code Status:     Code Status Orders        Start     Ordered   10/20/16 1403  Full code  Continuous     10/20/16 1403    Code Status History    Date Active Date Inactive Code Status Order ID Comments User Context   06/20/2016  9:48 AM 06/20/2016  2:49 PM Full Code [id number]  Algernon Huxley, MD Inpatient    Advance Directive Documentation   Flowsheet Row Most Recent Value  Type of Advance Directive  Healthcare Power of Attorney, Living will  Pre-existing out of facility DNR order (yellow form or pink MOST form)  No data  "MOST" Form in Place?   No data     Family Communication: Daughter On the phone Disposition Plan: To be determined  Consultants:  Pulmonary  Oncology  Procedures:  Thoracentesis  Antibiotics:  Zithromax  Time spent: 28 minutes.  Loletha Grayer  [employer]

## 2016-10-23 NOTE — Evaluation (Signed)
Physical Therapy Evaluation Patient Details Name: Edwin Moran MRN: [id number] DOB: 1941-01-31 Today's Date: 10/23/2016   History of Present Illness  75 yo male with onset of R occipital lobe infarct was admitted with SOB and recent dx lung CA, no PE.  General ized weakness.    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  After walking was 88% then 87% but had grip on walker.  When pt released the walker he had 93% O2.  Will follow acutely as needed for endurance training and to monitor vitals with therapy to progress, strengthening to address L side changes.    Follow Up Recommendations Home health PT;Supervision for mobility/OOB    Equipment Recommendations  Rolling walker with 5" wheels (if his is in poor condition)    Recommendations for Other Services       Precautions / Restrictions Precautions Precautions: Fall (telemetry) Restrictions Weight Bearing Restrictions: No      Mobility  Bed Mobility Overal bed mobility: Needs Assistance Bed Mobility: Supine to Sit;Sit to Supine     Supine to sit: Min guard;Min assist Sit to supine: Min guard   General bed mobility comments: can scoot up with bedrails  Transfers Overall transfer level: Needs assistance Equipment used: Rolling walker (2 wheeled);1 person hand held assist Transfers: Sit to/from [facility] Sit to Stand: Min guard Stand pivot transfers: Min guard       General transfer comment: cues for hand placement and for controlling his descent to the bed  Ambulation/Gait Ambulation/Gait assistance: Min guard Ambulation Distance (Feet): 45 Feet Assistive device: Rolling walker (2 wheeled);1 person hand held assist Gait Pattern/deviations: Step-through pattern;Step-to pattern;Wide base of support;Decreased stride length Gait velocity: reduced Gait  velocity interpretation: Below normal speed for age/gender General Gait Details: careful with wide turns and slow pace to avoid overstressing his oxygen consumption  Stairs            Wheelchair Mobility    Modified Rankin (Stroke Patients Only) Modified Rankin (Stroke Patients Only) Pre-Morbid Rankin Score: Slight disability Modified Rankin: Moderately severe disability     Balance Overall balance assessment: Needs assistance Sitting-balance support: Feet supported Sitting balance-Leahy Scale: Good   Postural control: Posterior lean Standing balance support: Bilateral upper extremity supported Standing balance-Leahy Scale: Fair Standing balance comment: less than fair dynamic balance                             Pertinent Vitals/Pain Pain Assessment: No/denies pain    Home Living Family/patient expects to be discharged to:: Private residence Living Arrangements: Children;Other relatives Available Help at Discharge: Family;Available PRN/intermittently Type of Home: House       Home Layout: One level Home Equipment: [place] - 2 wheels;Cane - single point      Prior Function Level of Independence: Independent with assistive device(s)         Comments: mainly using a walker lately     Hand Dominance        Extremity/Trunk Assessment   Upper Extremity Assessment Upper Extremity Assessment: Overall WFL for tasks assessed    Lower Extremity Assessment Lower Extremity Assessment: Overall WFL for tasks assessed    Cervical / Trunk Assessment Cervical / Trunk Assessment: Normal  Communication   Communication: No difficulties  Cognition Arousal/Alertness: Awake/alert Behavior  During Therapy: WFL for tasks assessed/performed Overall Cognitive Status: Within Functional Limits for tasks assessed                      General Comments      Exercises     Assessment/Plan    PT Assessment Patient needs continued PT services  PT  Problem List Decreased strength;Decreased range of motion;Decreased activity tolerance;Decreased balance;Decreased mobility;Decreased coordination;Decreased knowledge of use of DME;Cardiopulmonary status limiting activity          PT Treatment Interventions DME instruction;Gait training;Functional mobility training;Therapeutic activities;Therapeutic exercise;Balance training;Neuromuscular re-education;Patient/family education    PT Goals (Current goals can be found in the Care Plan section)  Acute Rehab PT Goals Patient Stated Goal: to get home and not be Out of breath PT Goal Formulation: With patient Time For Goal Achievement: 11/13/16 Potential to Achieve Goals: Good    Frequency Min 2X/week   Barriers to discharge Decreased caregiver support home alone at times    Co-evaluation               End of Session Equipment Utilized During Treatment: Gait belt (pt was assessed with no O2 used) Activity Tolerance: Patient tolerated treatment well;Treatment limited secondary to medical complications (Comment) (low O2 sats with no O2 used) Patient left: in bed;with call bell/phone within reach Nurse Communication: Mobility status         Time: 5625-6389 PT Time Calculation (min) (ACUTE ONLY): 24 min   Charges:   PT Evaluation $PT Eval Moderate Complexity: 1 Procedure PT Treatments $Gait Training: 8-22 mins   PT G CodesRamond Dial 11/10/16, 12:33 PM   Mee Hives, PT MS Acute Rehab Dept. Number: [place]

## 2016-10-24 ENCOUNTER — Inpatient Hospital Stay: Payer: Medicare Other

## 2016-10-24 DIAGNOSIS — J449 Chronic obstructive pulmonary disease, unspecified: Secondary | ICD-10-CM

## 2016-10-24 DIAGNOSIS — R5381 Other malaise: Secondary | ICD-10-CM

## 2016-10-24 DIAGNOSIS — N2889 Other specified disorders of kidney and ureter: Secondary | ICD-10-CM

## 2016-10-24 DIAGNOSIS — R0602 Shortness of breath: Secondary | ICD-10-CM

## 2016-10-24 DIAGNOSIS — I10 Essential (primary) hypertension: Secondary | ICD-10-CM

## 2016-10-24 DIAGNOSIS — R590 Localized enlarged lymph nodes: Secondary | ICD-10-CM

## 2016-10-24 DIAGNOSIS — R319 Hematuria, unspecified: Secondary | ICD-10-CM

## 2016-10-24 DIAGNOSIS — R5383 Other fatigue: Secondary | ICD-10-CM

## 2016-10-24 DIAGNOSIS — R531 Weakness: Secondary | ICD-10-CM

## 2016-10-24 DIAGNOSIS — E785 Hyperlipidemia, unspecified: Secondary | ICD-10-CM

## 2016-10-24 DIAGNOSIS — C3490 Malignant neoplasm of unspecified part of unspecified bronchus or lung: Secondary | ICD-10-CM

## 2016-10-24 DIAGNOSIS — J910 Malignant pleural effusion: Secondary | ICD-10-CM

## 2016-10-24 DIAGNOSIS — M1990 Unspecified osteoarthritis, unspecified site: Secondary | ICD-10-CM

## 2016-10-24 LAB — BODY FLUID CULTURE: CULTURE: NO GROWTH

## 2016-10-24 LAB — BASIC METABOLIC PANEL
Anion gap: 10 (ref 5–15)
BUN: 27 mg/dL — ABNORMAL HIGH (ref 6–20)
CALCIUM: 8.2 mg/dL — AB (ref 8.9–10.3)
CO2: 20 mmol/L — ABNORMAL LOW (ref 22–32)
CREATININE: 1.96 mg/dL — AB (ref 0.61–1.24)
Chloride: 107 mmol/L (ref 101–111)
GFR, EST AFRICAN AMERICAN: 37 mL/min — AB (ref >60)
GFR, EST NON AFRICAN AMERICAN: 32 mL/min — AB (ref >60)
Glucose, Bld: 225 mg/dL — ABNORMAL HIGH (ref 65–99)
Potassium: 3.3 mmol/L — ABNORMAL LOW (ref 3.5–5.1)
SODIUM: 137 mmol/L (ref 135–145)

## 2016-10-24 LAB — LEGIONELLA PNEUMOPHILA SEROGP 1 UR AG: L. PNEUMOPHILA SEROGP 1 UR AG: NEGATIVE

## 2016-10-24 MED ORDER — FUROSEMIDE 10 MG/ML IJ SOLN
40.0000 mg | Freq: Every day | INTRAMUSCULAR | Status: DC
  Administered 2016-10-25: 40 mg via INTRAVENOUS
  Filled 2016-10-24: qty 4

## 2016-10-24 MED ORDER — ZOLPIDEM TARTRATE 5 MG PO TABS
5.0000 mg | ORAL_TABLET | Freq: Every day | ORAL | Status: DC
  Administered 2016-10-24 – 2016-10-25 (×3): 5 mg via ORAL
  Filled 2016-10-24 (×3): qty 1

## 2016-10-24 MED ORDER — FUROSEMIDE 10 MG/ML IJ SOLN
40.0000 mg | Freq: Once | INTRAMUSCULAR | Status: AC
  Administered 2016-10-24: 40 mg via INTRAVENOUS
  Filled 2016-10-24: qty 4

## 2016-10-24 MED ORDER — LEVOFLOXACIN 500 MG PO TABS
500.0000 mg | ORAL_TABLET | Freq: Every day | ORAL | Status: DC
  Administered 2016-10-24 – 2016-10-25 (×2): 500 mg via ORAL
  Filled 2016-10-24 (×2): qty 1

## 2016-10-24 NOTE — Progress Notes (Signed)
Oxygen saturation 88 % room air patient placed on 2 liters and sat rechecked 92% on 2 liters

## 2016-10-24 NOTE — Progress Notes (Signed)
Patient ID: Edwin Moran, male   DOB: Apr 20, 1941, 75 y.o.   MRN: [id number]  [facility] PROGRESS NOTE  Edwin Moran DGL:[id number] DOB: 11-Feb-1941 DOA: 10/20/2016 PCP: Elsie Stain, MD  HPI/Subjective: Patient still not breathing well. Cough and shortness of breath persists. Still with wheezing. Still short of breath with minimal exertion.  Objective: Vitals:   10/23/16 2014 10/24/16 0518  BP: 113/63 108/70  Pulse: 81 90  Resp:  19  Temp: 97.8 F (36.6 C) 98.4 F (36.9 C)    Filed Weights   10/20/16 1044  Weight: 75.3 kg (166 lb)    ROS: Review of Systems  Constitutional: Negative for chills and fever.  Eyes: Negative for blurred vision.  Respiratory: Positive for cough, shortness of breath and wheezing.   Cardiovascular: Negative for chest pain.  Gastrointestinal: Negative for abdominal pain, constipation, diarrhea, nausea and vomiting.  Genitourinary: Negative for dysuria.  Musculoskeletal: Negative for joint pain.  Neurological: Negative for dizziness and headaches.   Exam: Physical Exam  Constitutional: He is oriented to person, place, and time.  HENT:  Nose: No mucosal edema.  Mouth/Throat: No oropharyngeal exudate or posterior oropharyngeal edema.  Eyes: Conjunctivae, EOM and lids are normal. Pupils are equal, round, and reactive to light.  Neck: No JVD present. Carotid bruit is not present. No edema present. No thyroid mass and no thyromegaly present.  Cardiovascular: S1 normal and S2 normal.  Exam reveals no gallop.   No murmur heard. Pulses:      Dorsalis pedis pulses are 2+ on the right side, and 2+ on the left side.  Respiratory: Accessory muscle usage present. He is in respiratory distress. He has decreased breath sounds in the right middle field, the right lower field, the left middle field and the left lower field. He has wheezes in the right upper field, the right middle field and the left middle field. He has rhonchi in the left lower field. He has  no rales.  GI: Soft. Bowel sounds are normal. There is no tenderness.  Musculoskeletal:       Right ankle: He exhibits no swelling.       Left ankle: He exhibits no swelling.  Lymphadenopathy:    He has no cervical adenopathy.  Neurological: He is alert and oriented to person, place, and time. No cranial nerve deficit.  Skin: Skin is warm. No rash noted. Nails show no clubbing.  Psychiatric: He has a normal mood and affect.      Data Reviewed: Basic Metabolic Panel:  Recent Labs Lab 10/20/16 1046 10/21/16 0443 10/23/16 0458 10/24/16 0421  NA 137 138 139 137  K 3.5 3.2* 2.9* 3.3*  CL 103 107 109 107  CO2 24 21* 22 20*  GLUCOSE 98 90 92 225*  BUN 39* 35* 26* 27*  CREATININE 2.09* 1.86* 1.95* 1.96*  CALCIUM 8.6* 8.0* 7.9* 8.2*   L  Recent Labs Lab 10/20/16 1046  AMYLASE 49   CBC:  Recent Labs Lab 10/20/16 1046 10/21/16 0443  WBC 9.6 8.2  NEUTROABS 8.0*  --   HGB 12.8* 12.4*  HCT 36.6* 35.3*  MCV 96.1 95.6  PLT 255 220   Cardiac Enzymes:  Recent Labs Lab 10/20/16 1046  TROPONINI <0.03   BNP (last 3 results)  Recent Labs  10/20/16 1046  BNP 93.0     Recent Results (from the past 240 hour(s))  Body fluid culture     Status: None   Collection Time: 10/20/16  4:30 PM  Result Value Ref  Range Status   Specimen Description PLEURAL  Final   Special Requests NONE  Final   Gram Stain   Final    ABUNDANT WBC PRESENT,BOTH PMN AND MONONUCLEAR NO ORGANISMS SEEN    Culture   Final    NO GROWTH 3 DAYS Performed at [facility]    Report Status 10/24/2016 FINAL  Final     Studies: Dg Chest 1 View  Result Date: 10/24/2016 CLINICAL DATA:  Wheezing.  Hyperlipidemia.  Hypertension. EXAM: CHEST 1 VIEW COMPARISON:  10/20/2016 FINDINGS: Midline trachea. Mild cardiomegaly. Small right pleural effusion persists. No pneumothorax. Right greater than left interstitial edema is minimally improved on the left. Fluid tracking in the right minor fissure.  IMPRESSION: Minimal improvement in congestive heart failure with persistent small right pleural effusion. Electronically Signed   By: Abigail Miyamoto M.D.   On: 10/24/2016 08:17   Mr Jeri Cos XT Contrast  Result Date: 10/22/2016 CLINICAL DATA:  75 year old hypertensive male with hyperlipidemia presenting with weakness and fatigue. Recent diagnosis of lung cancer. Initial encounter. EXAM: MRI HEAD WITHOUT AND WITH CONTRAST TECHNIQUE: Multiplanar, multiecho pulse sequences of the brain and surrounding structures were obtained without and with intravenous contrast. CONTRAST:  68m MULTIHANCE GADOBENATE DIMEGLUMINE 529 MG/ML IV SOLN COMPARISON:  None. 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 No intracranial hemorrhage. No intracranial enhancing lesion or bony destructive lesion to suggest presence of intracranial metastatic disease. Mild to moderate chronic microvascular changes. Mild moderate global atrophy without hydrocephalus. Vascular: Major intracranial vascular structures are patent. 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 Sinuses/Orbits: Post lens replacement without acute orbital abnormality. Air-fluid level left maxillary sinus may indicate acute sinusitis. Diminutive size right maxillary sinus. Mild mucosal thickening ethmoid sinus air cells and left frontal sinus. Other: Possible right upper neck cystic adenopathy. CT of the neck recommended for further delineation. 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 No evidence of intracranial metastatic disease. Mild to moderate chronic microvascular changes. Mild to moderate global atrophy without hydrocephalus. Possible right upper neck cystic adenopathy. CT of the neck recommended for further delineation. 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 This can be  further assessed with cervical spine MR if clinically desired. Air-fluid level left maxillary sinus may indicate acute sinusitis. Diminutive size right maxillary sinus. Mild mucosal thickening ethmoid sinus air cells and left frontal sinus. Electronically Signed   By: SGenia DelM.D.   On: 10/22/2016 18:39   [place]ot[place] Bilateral  Result Date: 10/24/2016 CLINICAL DATA:  Small acute right occipital punctate infarct EXAM: BILATERAL CAROTID DUPLEX ULTRASOUND TECHNIQUE: GPearline Cablesscale imaging, color Doppler and duplex ultrasound were performed of bilateral carotid and vertebral arteries in the neck. COMPARISON:  10/22/2016 FINDINGS: Criteria: Quantification of carotid stenosis is based on velocity parameters that correlate the residual internal carotid diameter with NASCET-based stenosis levels, using the diameter of the distal internal carotid lumen as the denominator for stenosis measurement. The following velocity measurements were obtained: RIGHT ICA:  124/38 cm/sec CCA:  606/26cm/sec SYSTOLIC ICA/CCA RATIO:  1.8 DIASTOLIC ICA/CCA RATIO:  3.2 ECA:  113 cm/sec LEFT ICA:  80/19 cm/sec CCA:  694/8cm/sec SYSTOLIC ICA/CCA RATIO:  1.2 DIASTOLIC ICA/CCA RATIO:  2.3 ECA:  86 cm/sec RIGHT CAROTID ARTERY: Mild echogenic shadowing plaque formation. No hemodynamically significant right ICA stenosis, velocity elevation, or turbulent flow. Degree of narrowing less than 50%. RIGHT VERTEBRAL  ARTERY:  Antegrade LEFT CAROTID ARTERY: Similar scattered mild echogenic plaque formation. No hemodynamically significant left ICA stenosis, velocity elevation, or turbulent flow. LEFT VERTEBRAL ARTERY:  Antegrade IMPRESSION: Mild carotid atherosclerosis. No hemodynamically significant ICA stenosis. Degree of narrowing less than 50% bilaterally. Patent antegrade vertebral flow bilaterally Electronically Signed   By: Jerilynn Mages.  Shick M.D.   On: 10/24/2016 10:38    Scheduled Meds: . albuterol  2.5 mg Nebulization Q6H  . aspirin EC  81 mg Oral  Daily  . atorvastatin  20 mg Oral Daily  . budesonide (PULMICORT) nebulizer solution  0.5 mg Nebulization BID  . cholecalciferol  1,000 Units Oral Daily  . clopidogrel  75 mg Oral Daily  . docusate sodium  100 mg Oral BID  . enoxaparin (LOVENOX) injection  40 mg Subcutaneous Q24H  . [START ON 10/25/2016] furosemide  40 mg Intravenous Daily  . levofloxacin  500 mg Oral Daily  . methylPREDNISolone (SOLU-MEDROL) injection  40 mg Intravenous Q8H  . nystatin  5 mL Oral QID  . sodium chloride flush  3 mL Intravenous Q12H  . vitamin B-12  1,000 mcg Oral Daily  . zolpidem  5 mg Oral QHS    Assessment/Plan:  1. Acute respiratory failure with hypoxia. Patient is able to come off oxygen at some points and then hypoxic at other points.  2. Fluid overload and acute diastolic congestive heart failure with pleural effusion. Give IV Lasix today and see if improvement. The patient does have an allergy to potassium replacement so I have to be careful with the amount of Lasix to give.  3. 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 MRI of the brain Negative for metastases. We'll need an outpatient PET CT scan. Appreciate oncology consultation. Overall prognosis is poor and patient is a full code. 4. COPD exacerbation and possible pneumonia. Continue DuoNeb nebulizer standing dose, budesonide nebulizers and IV Solu-Medrol. Switch antibiotics from Zithromax to Levaquin. Respiratory status will need to be better prior to disposition. 5. Relative hypotension. Stopped antihypertensive medications 6. Chronic kidney disease stage III. Continue to monitor closely 7. Peripheral vascular disease on Plavix 8. Hyperlipidemia unspecified on atorvastatin 9. Weakness. Physical therapy consultation appreciated 10. Acute stroke seen on MRI of the brain in the right occipital lobe. Patient not having any visual or motor symptoms. Continue Plavix. Carotid ultrasound negative 11. Cirrhosis of the liver seen  on CT scan of the chest. Send hepatitis profiles. Daughter states the patient is a heavy drinker.  Code Status:     Code Status Orders        Start     Ordered   10/20/16 1403  Full code  Continuous     10/20/16 1403    Code Status History    Date Active Date Inactive Code Status Order ID Comments User Context   06/20/2016  9:48 AM 06/20/2016  2:49 PM Full Code [id number]  Algernon Huxley, MD Inpatient    Advance Directive Documentation   Flowsheet Row Most Recent Value  Type of Advance Directive  Healthcare Power of Attorney, Living will  Pre-existing out of facility DNR order (yellow form or pink MOST form)  No data  "MOST" Form in Place?  No data     Family Communication: Daughter On the phone Yesterday Disposition Plan: Home once breathing better  Consultants:  Pulmonary  Oncology  Procedures:  Thoracentesis  Antibiotics:  Levaquin  Time spent: 24 minutes.  Loletha Grayer  [employer]

## 2016-10-24 NOTE — Progress Notes (Signed)
[place]  [phone] Fax:([phone]  ID: Edwin Moran OB: 01-Dec-1940  MR#: [id number]  [id number]#:[id number]  Patient Care Team: Tonia Ghent, MD as PCP - General (Family Medicine)  CHIEF COMPLAINT: Persistent shortness of breath, stage IV adenocarcinoma.  INTERVAL HISTORY: Patient continues to have shortness of breath which is worse with activity and eating. He otherwise feels well and offers no complaints.  REVIEW OF SYSTEMS:   Review of Systems  Constitutional: Negative.  Negative for fever, malaise/fatigue and weight loss.  Respiratory: Positive for shortness of breath. Negative for cough.   Cardiovascular: Negative.  Negative for chest pain and leg swelling.  Gastrointestinal: Negative.  Negative for abdominal pain.  Genitourinary: Negative.   Musculoskeletal: Negative.   Neurological: Negative for weakness.  Psychiatric/Behavioral: Negative.  The patient is not nervous/anxious.     As per HPI. Otherwise, a complete review of systems is negative.  PAST MEDICAL HISTORY: Past Medical History:  Diagnosis Date  . Cancer ([place])    skin  . Complication of anesthesia 1963   pt woke up during the procedure choking on drainage.  Marland Kitchen COPD (chronic obstructive pulmonary disease) ([place])   . Diverticulosis of colon   . GERD (gastroesophageal reflux disease)   . History of GI diverticular bleed 03/20-03/31/04   [facility]  . History of hiatal hernia   . Hyperlipidemia   . Hypertension   . Osteoarthritis   . Peripheral vascular disease ([place])    right foot tunred purole  . PONV (postoperative nausea and vomiting)    at age 27 with finger surgery.  . Renal insufficiency   . Smoking     PAST SURGICAL HISTORY: Past Surgical History:  Procedure Laterality Date  . COLONOSCOPY    . CYSTOSCOPY  1991   due to prolonged prostatitis  . CYSTOSCOPY W/ RETROGRADES Bilateral 10/10/2016   Procedure: CYSTOSCOPY WITH RETROGRADE PYELOGRAM;  Surgeon: Hollice Espy, MD;  Location: [facility];  Service: Urology;  Laterality: Bilateral;  . CYSTOSCOPY WITH BIOPSY N/A 10/10/2016   Procedure: CYSTOSCOPY WITH BIOPSY;  Surgeon: Hollice Espy, MD;  Location: [facility];  Service: Urology;  Laterality: N/A;  . CYSTOSCOPY WITH STENT PLACEMENT Left 10/10/2016   Procedure: CYSTOSCOPY WITH STENT PLACEMENT;  Surgeon: Hollice Espy, MD;  Location: [facility];  Service: Urology;  Laterality: Left;  . 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 PERIPHERAL VASCULAR CATHETERIZATION Right 06/20/2016   Procedure: Lower Extremity Angiography;  Surgeon: Algernon Huxley, MD;  Location: [place] [facility];  Service: Cardiovascular;  Laterality: Right;  . URETEROSCOPY Left 10/10/2016   Procedure: URETEROSCOPY;  Surgeon: Hollice Espy, MD;  Location: [facility];  Service: Urology;  Laterality: Left;  . WISDOM TOOTH EXTRACTION      FAMILY HISTORY: Family History  Problem Relation Age of Onset  . Asthma Mother     chronic, asthma attack caused MI  . Heart disease Mother     MI  . Thrombophlebitis Mother   . Hiatal hernia Mother   . Prostate cancer Neg Hx   . Colon cancer Neg Hx     ADVANCED DIRECTIVES (Y/N):  '@ADVDIR'$ @  HEALTH MAINTENANCE: Social History  Substance Use Topics  . Smoking status: Former Smoker    Packs/day: 1.00    Years: 40.00    Quit date: 07/31/2016  . Smokeless tobacco: Never Used     Comment: off and on for 40 years, 1 PPD at first  . Alcohol  use 12.6 - 16.8 oz/week    14 Standard drinks or equivalent, 7 - 14 Shots of liquor per week     Comment: couple of drinks a day     Colonoscopy:  PAP:  Bone density:  Lipid panel:  Allergies  Allergen Reactions  . Cefprozil     REACTION: Swelling  . Penicillins Swelling    Has patient had a PCN reaction causing immediate rash, facial/tongue/throat swelling, SOB or lightheadedness with hypotension: Yes Has patient had a PCN  reaction causing severe rash involving mucus membranes or skin necrosis: No Has patient had a PCN reaction that required hospitalization No Has patient had a PCN reaction occurring within the last 10 years: No If all of the above answers are "NO", then may proceed with Cephalosporin use.   Marland Kitchen Potassium Chloride     REACTION: SOB    Current Facility-Administered Medications  Medication Dose Route Frequency Provider Last Rate Last Dose  . acetaminophen (TYLENOL) tablet 650 mg  650 mg Oral Q6H PRN Bettey Costa, MD       Or  . acetaminophen (TYLENOL) suppository 650 mg  650 mg Rectal Q6H PRN Sital Mody, MD      . albuterol (PROVENTIL) (2.5 MG/3ML) 0.083% nebulizer solution 2.5 mg  2.5 mg Nebulization Q6H Loletha Grayer, MD   2.5 mg at 10/24/16 1327  . aspirin EC tablet 81 mg  81 mg Oral Daily Sital Mody, MD   81 mg at 10/24/16 1022  . atorvastatin (LIPITOR) tablet 20 mg  20 mg Oral Daily Bettey Costa, MD   20 mg at 10/24/16 1022  . budesonide (PULMICORT) nebulizer solution 0.5 mg  0.5 mg Nebulization BID Loletha Grayer, MD   0.5 mg at 10/24/16 4166  . cholecalciferol (VITAMIN D) tablet 1,000 Units  1,000 Units Oral Daily Bettey Costa, MD   1,000 Units at 10/24/16 1021  . clopidogrel (PLAVIX) tablet 75 mg  75 mg Oral Daily Bettey Costa, MD   75 mg at 10/24/16 1021  . docusate sodium (COLACE) capsule 100 mg  100 mg Oral BID Bettey Costa, MD   100 mg at 10/24/16 1021  . enoxaparin (LOVENOX) injection 40 mg  40 mg Subcutaneous Q24H Sheema M Hallaji, RPH   40 mg at 10/23/16 2117  . [START ON 10/25/2016] furosemide (LASIX) injection 40 mg  40 mg Intravenous Daily Loletha Grayer, MD      . HYDROcodone-acetaminophen (NORCO/VICODIN) 5-325 MG per tablet 1-2 tablet  1-2 tablet Oral Q4H PRN Bettey Costa, MD      . levofloxacin (LEVAQUIN) tablet 500 mg  500 mg Oral Daily Loletha Grayer, MD   500 mg at 10/24/16 1021  . methylPREDNISolone sodium succinate (SOLU-MEDROL) 40 mg/mL injection 40 mg  40 mg Intravenous Q8H  Richard Leslye Peer, MD   40 mg at 10/24/16 0810  . nystatin (MYCOSTATIN) 100000 UNIT/ML suspension 500,000 Units  5 mL Oral QID Loletha Grayer, MD   500,000 Units at 10/24/16 1022  . oxybutynin (DITROPAN) tablet 5 mg  5 mg Oral Q8H PRN Bettey Costa, MD      . senna-docusate (Senokot-S) tablet 1 tablet  1 tablet Oral QHS PRN Bettey Costa, MD      . sodium chloride flush (NS) 0.9 % injection 3 mL  3 mL Intravenous Q12H Bettey Costa, MD   3 mL at 10/24/16 1023  . vitamin B-12 (CYANOCOBALAMIN) tablet 1,000 mcg  1,000 mcg Oral Daily Bettey Costa, MD   1,000 mcg at 10/24/16 1022  . zolpidem (  AMBIEN) tablet 5 mg  5 mg Oral QHS Alexis Hugelmeyer, DO   5 mg at 10/24/16 0111    OBJECTIVE: Vitals:   10/24/16 0518 10/24/16 1318  BP: 108/70 110/70  Pulse: 90 88  Resp: 19 18  Temp: 98.4 F (36.9 C) 98.4 F (36.9 C)     Body mass index is 28.49 kg/m.    ECOG FS:1 - Symptomatic but completely ambulatory  General: Well-developed, well-nourished, no acute distress. Eyes: Pink conjunctiva, anicteric sclera. Lungs: Diminished right breath sounds.  Heart: Regular rate and rhythm. No rubs, murmurs, or gallops. Abdomen: Soft, nontender, nondistended. No organomegaly noted, normoactive bowel sounds. Musculoskeletal: No edema, cyanosis, or clubbing. Neuro: Alert, answering all questions appropriately. Cranial nerves grossly intact. Skin: No rashes or petechiae noted. Psych: Normal affect.   LAB RESULTS:  Lab Results  Component Value Date   NA 137 10/24/2016   K 3.3 (L) 10/24/2016   CL 107 10/24/2016   CO2 20 (L) 10/24/2016   GLUCOSE 225 (H) 10/24/2016   BUN 27 (H) 10/24/2016   CREATININE 1.96 (H) 10/24/2016   CALCIUM 8.2 (L) 10/24/2016   PROT 8.2 (H) 08/27/2016   ALBUMIN 4.0 08/27/2016   AST 15 08/27/2016   ALT 17 08/27/2016   ALKPHOS 112 08/27/2016   BILITOT 0.7 08/27/2016   GFRNONAA 32 (L) 10/24/2016   GFRAA 37 (L) 10/24/2016    Lab Results  Component Value Date   WBC 8.2 10/21/2016    NEUTROABS 8.0 (H) 10/20/2016   HGB 12.4 (L) 10/21/2016   HCT 35.3 (L) 10/21/2016   MCV 95.6 10/21/2016   PLT 220 10/21/2016     STUDIES: Ct Abdomen Pelvis Wo Contrast  Result Date: 09/26/2016 CLINICAL DATA:  Pt c/o having an episode of gross painless hematuria 1 month ago which has not recurred. He was started on Plavix for PVD several months ago. Hx Left inguinal stent. No hx kidney stones. NKI. No hx CA. EXAM: CT ABDOMEN AND PELVIS WITHOUT CONTRAST TECHNIQUE: Multidetector CT imaging of the abdomen and pelvis was performed following the standard protocol without IV contrast. COMPARISON:  08/27/2016 FINDINGS: Lower chest: Right lower lobe pulmonary nodule is 8 x 10 mm (mean 9 mm) on image 3 of series 3. Reticular changes are identified in the lung bases bilaterally, similar in appearance to prior study. No pleural effusions. Coronary artery calcifications are present. Hepatobiliary: The liver appears nodular raising the question of cirrhosis. No focal hepatic mass. Gallbladder is present. Pancreas: Unremarkable. No pancreatic ductal dilatation or surrounding inflammatory changes. Spleen: Normal in size without focal abnormality. Adrenals/Urinary Tract: Prominent adrenal glands bilaterally, similar in appearance to prior study. There is moderate left hydronephrosis and left hydroureter, as seen on the prior study. No ureteral stones are identified. Nodule previously noted in the region of the left upper pole collecting systems not seen on the current study. There are multiple low-attenuation lesions within the kidneys bilaterally consistent with cyst. Calcifications within the renal pelvis bilaterally are consistent with vascular calcifications or possible small stones. No evidence for hydronephrosis or hydroureter on the right. Stomach/Bowel: The stomach and small bowel loops are normal in appearance. There is significant diverticular disease of the colon not associated with acute inflammation. The  appendix is well seen and has a normal appearance. Vascular/Lymphatic: There is dense atherosclerotic calcification of the abdominal aorta and its branches. No aneurysm. Maximum diameter is 2.6 cm. Small periaortic lymph nodes appear stable, largest in the left periaortic region measuring 1.0 cm on image 29 of  series 2. Reproductive: The prostate is enlarged, creating a a prominent impression on the base of the bladder. Other: There is a small amount free pelvic fluid. Musculoskeletal: Significant spondylosis of the lumbar spine, associated with endplate sclerosis. No discrete sclerotic lesions are identified. IMPRESSION: 1. Persistent right lower lobe pulmonary nodule, measuring slightly smaller possibly related to differences in imaging technique. Given its persistence, consider follow-up PET-CT or tissue sampling. 2. Stable appearance of small periaortic lymph nodes. These could be further evaluated at that time a PET-CT. 3. Persistent dilatation of the left renal collecting system without radiopaque calculi. Consider urothelial lesion as a cause for obstruction. Electronically Signed   By: Nolon Nations M.D.   On: 09/26/2016 16:58   Dg Chest 1 View  Result Date: 10/24/2016 CLINICAL DATA:  Wheezing.  Hyperlipidemia.  Hypertension. EXAM: CHEST 1 VIEW COMPARISON:  10/20/2016 FINDINGS: Midline trachea. Mild cardiomegaly. Small right pleural effusion persists. No pneumothorax. Right greater than left interstitial edema is minimally improved on the left. Fluid tracking in the right minor fissure. IMPRESSION: Minimal improvement in congestive heart failure with persistent small right pleural effusion. Electronically Signed   By: Abigail Miyamoto M.D.   On: 10/24/2016 08:17   Dg Chest 1 View  Result Date: 10/20/2016 CLINICAL DATA:  Status post right-sided thoracentesis. EXAM: CHEST 1 VIEW COMPARISON:  CT scan of the chest of today's date FINDINGS: The volume of pleural fluid on the right has decreased  significantly since the thoracentesis. There is no postprocedure pneumothorax. Both lungs are reasonably well expanded. The interstitial markings remain increased. There is confluent density in the left upper lobe consistent with pneumonia. The heart is normal in size. The pulmonary vascularity is not engorged. The mediastinum is normal in width. The trachea is midline. The bony thorax exhibits no acute abnormality. IMPRESSION: No postprocedure pneumothorax following right-sided thoracentesis. Electronically Signed   By: David  Martinique M.D.   On: 10/20/2016 16:43   Dg Chest 2 View  Result Date: 10/04/2016 CLINICAL DATA:  Wheezing. EXAM: CHEST  2 VIEW COMPARISON:  CT 09/26/2016.  Chest x-ray 03/10/ 2015. FINDINGS: Mediastinum and hilar structures are normal. Borderline cardiomegaly. Diffuse bilateral from interstitial prominence noted consistent with interstitial edema and/or pneumonitis. Small bilateral pleural effusions noted. Previously identified right base pulmonary nodule not identified due to overlying acute interstitial process. No acute bony abnormality . IMPRESSION: 1. Mild cardiomegaly. Mild bilateral from interstitial prominence consist with interstitial edema and/or pneumonitis. Small bilateral pleural effusions. 2. Previously identified small stable pulmonary nodule in the right lung base is not identified due to overlying acute interstitial process. Electronically Signed   By: [place]   On: 10/04/2016 11:01   Ct Angio Chest Pe W And/or Wo Contrast  Result Date: 10/20/2016 CLINICAL DATA:  Shortness of Breath EXAM: CT ANGIOGRAPHY CHEST WITH CONTRAST TECHNIQUE: Multidetector CT imaging of the chest was performed using the standard protocol during bolus administration of intravenous contrast. Multiplanar CT image reconstructions and MIPs were obtained to evaluate the vascular anatomy. CONTRAST:  75 mL Isovue 370 nonionic COMPARISON:  Chest radiograph October 04, 2016 and CT abdomen and  pelvis September 26, 2016 FINDINGS: Cardiovascular: There is no demonstrable pulmonary embolus. There is no appreciable thoracic aortic aneurysm or dissection. There is moderate atherosclerotic calcification at the origins of the great vessels. There is moderate atherosclerotic calcification in the aorta. There is peripheral thrombus in portions of the descending thoracic aorta. There are multiple foci of coronary artery calcification. The pericardium is not appreciably thickened.  Mediastinum/Nodes: Thyroid appears unremarkable. There are multiple subcentimeter lymph nodes in the mediastinum. In the right axillary region, there is no adenopathy. However, there is soft tissue stranding in the right axillary region. There are mildly prominent lymph nodes in the aortopulmonary window region, largest measuring 1.5 x 1.0 cm. There is an enlarged lymph node immediately anterior to the carina on the right measuring 2.0 x 1.4 cm. There is a sub- carinal lymph node measuring 1.6 x 1.2 cm. Lungs/Pleura: There is a sizable free-flowing pleural effusion on the right. There is lower lobe region interstitial edema bilaterally. There are multiple foci of airspace consolidation bilaterally including in both lower lobes, primarily in the posterior segment left lower lobe, in the posterior segments of both upper lobes, in the anterior segment right upper lobe, and in both perihilar regions, more pronounced on the left than on the right. Previously noted 1 x 1 cm nodular opacity in the anterior segment of the right lower lobe is seen on axial slice 70 series 6, partially obscured by adjacent fluid and edema. Upper Abdomen: In the visualized upper abdomen, there is diffuse adrenal hypertrophy, stable. There is a cyst arising from the posterior upper pole right kidney measuring 2.7 x 2.1 cm. There is incomplete visualization of a cyst in the upper pole of the left kidney measuring 2.4 x 2.1 cm. There are multiple foci of atherosclerotic  calcification in the aorta and major visualized mesenteric arteries. There is incomplete visualization of a 3 mm calculus in the upper pole of the right kidney. Liver contour raises concern for a degree of underlying hepatic cirrhosis. Musculoskeletal: There is degenerative change in the thoracic spine. There are no blastic or lytic bone lesions. Review of the MIP images confirms the above findings. IMPRESSION: No demonstrable pulmonary embolus. Evidence of a degree of congestive heart failure. Note that there is interstitial edema and fairly sizable right pleural effusion. Multiple areas of alveolar opacity are felt to be most likely due to multifocal pneumonia, although some of this opacity may represent alveolar edema. Both edema and pneumonia can present concurrently. Previously noted 1 x 1 cm nodular opacity in the anterior segment right lower lobe persists. Small neoplasm in this area must be of concern. This finding may well warrant nuclear medicine PET study to further evaluate given its persistence and size. Several prominent lymph nodes. Question reactive versus neoplastic etiology. There is soft tissue stranding in the right axilla with several small lymph nodes in this area. 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 Areas of atherosclerotic calcification in the aorta. Multiple foci of coronary artery calcification evident. Stable bilateral adrenal hypertrophy. Nonobstructing calculus upper pole right kidney. Contour of liver suggests a degree of underlying cirrhosis. Electronically Signed   By: Lowella Grip III M.D.   On: 10/20/2016 13:21   Mr Jeri Cos GB Contrast  Result Date: 10/22/2016 CLINICAL DATA:  75 year old hypertensive male with hyperlipidemia presenting with weakness and fatigue. Recent diagnosis of lung cancer. Initial encounter. EXAM: MRI HEAD WITHOUT AND WITH CONTRAST TECHNIQUE: Multiplanar, multiecho pulse sequences of the brain and surrounding structures  were obtained without and with intravenous contrast. CONTRAST:  79m MULTIHANCE GADOBENATE DIMEGLUMINE 529 MG/ML IV SOLN COMPARISON:  None. 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 No intracranial hemorrhage. No intracranial enhancing lesion or bony destructive lesion to suggest presence of intracranial metastatic disease. Mild to moderate chronic microvascular changes. Mild moderate global atrophy without hydrocephalus. Vascular: Major intracranial vascular structures are patent.  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 Sinuses/Orbits: Post lens replacement without acute orbital abnormality. Air-fluid level left maxillary sinus may indicate acute sinusitis. Diminutive size right maxillary sinus. Mild mucosal thickening ethmoid sinus air cells and left frontal sinus. Other: Possible right upper neck cystic adenopathy. CT of the neck recommended for further delineation. 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 No evidence of intracranial metastatic disease. Mild to moderate chronic microvascular changes. Mild to moderate global atrophy without hydrocephalus. Possible right upper neck cystic adenopathy. CT of the neck recommended for further delineation. 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 This can be further assessed with cervical spine MR if clinically desired. Air-fluid level left maxillary sinus may indicate acute sinusitis. Diminutive size right maxillary sinus. Mild mucosal thickening ethmoid sinus air cells and left frontal sinus. Electronically Signed   By: Genia Del M.D.   On: 10/22/2016 18:39   US Carotid Bilateral  Result Date: 10/24/2016 CLINICAL DATA:  Small acute right occipital punctate infarct EXAM: BILATERAL CAROTID DUPLEX ULTRASOUND TECHNIQUE: Pearline Cables scale imaging, color Doppler and duplex  ultrasound were performed of bilateral carotid and vertebral arteries in the neck. COMPARISON:  10/22/2016 FINDINGS: Criteria: Quantification of carotid stenosis is based on velocity parameters that correlate the residual internal carotid diameter with NASCET-based stenosis levels, using the diameter of the distal internal carotid lumen as the denominator for stenosis measurement. The following velocity measurements were obtained: RIGHT ICA:  124/38 cm/sec CCA:  85/02 cm/sec SYSTOLIC ICA/CCA RATIO:  1.8 DIASTOLIC ICA/CCA RATIO:  3.2 ECA:  113 cm/sec LEFT ICA:  80/19 cm/sec CCA:  77/4 cm/sec SYSTOLIC ICA/CCA RATIO:  1.2 DIASTOLIC ICA/CCA RATIO:  2.3 ECA:  86 cm/sec RIGHT CAROTID ARTERY: Mild echogenic shadowing plaque formation. No hemodynamically significant right ICA stenosis, velocity elevation, or turbulent flow. Degree of narrowing less than 50%. RIGHT VERTEBRAL ARTERY:  Antegrade LEFT CAROTID ARTERY: Similar scattered mild echogenic plaque formation. No hemodynamically significant left ICA stenosis, velocity elevation, or turbulent flow. LEFT VERTEBRAL ARTERY:  Antegrade IMPRESSION: Mild carotid atherosclerosis. No hemodynamically significant ICA stenosis. Degree of narrowing less than 50% bilaterally. Patent antegrade vertebral flow bilaterally Electronically Signed   By: Jerilynn Mages.  Shick M.D.   On: 10/24/2016 10:38   US Thoracentesis Asp Pleural Space W/img Guide  Result Date: 10/20/2016 INDICATION: Shortness of breath and right pleural effusion. EXAM: ULTRASOUND GUIDED RIGHT THORACENTESIS MEDICATIONS: None. COMPLICATIONS: None immediate. PROCEDURE: An ultrasound guided thoracentesis was thoroughly discussed with the patient and questions answered. The benefits, risks, alternatives and complications were also discussed. The patient understands and wishes to proceed with the procedure. Written consent was obtained. Ultrasound was performed to localize and mark an adequate pocket of fluid in the right chest. The  area was then prepped and draped in the normal sterile fashion. 1% Lidocaine was used for local anesthesia. Under ultrasound guidance a 6 Fr Safe-T-Centesis catheter was introduced. Thoracentesis was performed. The catheter was removed and a dressing applied. FINDINGS: A total of approximately 800 mL of amber color fluid was removed. Samples were sent to the laboratory as requested by the clinical team. IMPRESSION: Successful ultrasound guided right thoracentesis yielding 800 mL of pleural fluid. Electronically Signed   By: Markus Daft M.D.   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   PLAN:    1. 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 Patient actively being worked up by urology for gross hematuria. He has a left ureteral mass as well as some borderline periaortic lymphadenopathy. Pleural effusion consistent with adenocarcinoma. Patient might require an additional biopsy as well as additional testing to determine whether this is primary lung malignancy or metastatic disease. He also may require Pleurx catheter depending on how quickly the malignant pleural effusion reaccumulates. MRI the brain negative for metastatic disease. Once patient is discharged, he can follow-up in the [facility] within 1 week at which time we will schedule a PET scan to complete the staging workup.  2. Shortness of breath: Multifactorial. Continue current treatments as ordered.  Appreciate consult, will follow.   Lloyd Huger, MD   10/24/2016 2:25 PM

## 2016-10-25 LAB — HEPATITIS B SURFACE ANTIGEN: HEP B S AG: NEGATIVE

## 2016-10-25 LAB — BASIC METABOLIC PANEL
ANION GAP: 10 (ref 5–15)
BUN: 30 mg/dL — ABNORMAL HIGH (ref 6–20)
CALCIUM: 8.5 mg/dL — AB (ref 8.9–10.3)
CO2: 23 mmol/L (ref 22–32)
Chloride: 106 mmol/L (ref 101–111)
Creatinine, Ser: 1.9 mg/dL — ABNORMAL HIGH (ref 0.61–1.24)
GFR calc Af Amer: 38 mL/min — ABNORMAL LOW (ref >60)
GFR, EST NON AFRICAN AMERICAN: 33 mL/min — AB (ref >60)
GLUCOSE: 146 mg/dL — AB (ref 65–99)
Potassium: 3.3 mmol/L — ABNORMAL LOW (ref 3.5–5.1)
SODIUM: 139 mmol/L (ref 135–145)

## 2016-10-25 LAB — HEPATITIS B CORE ANTIBODY, TOTAL: HEP B C TOTAL AB: NEGATIVE

## 2016-10-25 LAB — HEPATITIS C ANTIBODY: HCV Ab: <0.1 s/co ratio (ref 0.0–0.9)

## 2016-10-25 LAB — GLOMERULAR BASEMENT MEMBRANE ANTIBODIES: GBM Ab: 4 units (ref 0–20)

## 2016-10-25 LAB — ANCA TITERS: Atypical P-ANCA titer: <1:20 {titer}

## 2016-10-25 LAB — HEPATITIS B SURFACE ANTIBODY, QUANTITATIVE: Hepatitis B-Post: <3.1 m[IU]/mL — ABNORMAL LOW (ref >9.9)

## 2016-10-25 MED ORDER — LEVOFLOXACIN 500 MG PO TABS
250.0000 mg | ORAL_TABLET | Freq: Every day | ORAL | Status: DC
  Administered 2016-10-26: 250 mg via ORAL
  Filled 2016-10-25: qty 1

## 2016-10-25 MED ORDER — ALBUTEROL SULFATE (2.5 MG/3ML) 0.083% IN NEBU
2.5000 mg | INHALATION_SOLUTION | Freq: Four times a day (QID) | RESPIRATORY_TRACT | Status: DC
  Administered 2016-10-26 (×2): 2.5 mg via RESPIRATORY_TRACT
  Filled 2016-10-25 (×2): qty 3

## 2016-10-25 MED ORDER — POTASSIUM CHLORIDE CRYS ER 20 MEQ PO TBCR
20.0000 meq | EXTENDED_RELEASE_TABLET | Freq: Every day | ORAL | Status: DC
  Administered 2016-10-26: 20 meq via ORAL
  Filled 2016-10-25: qty 1

## 2016-10-25 MED ORDER — ATORVASTATIN CALCIUM 20 MG PO TABS
40.0000 mg | ORAL_TABLET | Freq: Every day | ORAL | Status: DC
  Administered 2016-10-26: 40 mg via ORAL
  Filled 2016-10-25: qty 2

## 2016-10-25 MED ORDER — POTASSIUM CHLORIDE CRYS ER 20 MEQ PO TBCR
40.0000 meq | EXTENDED_RELEASE_TABLET | Freq: Once | ORAL | Status: AC
  Administered 2016-10-25: 40 meq via ORAL
  Filled 2016-10-25: qty 2

## 2016-10-25 MED ORDER — METHYLPREDNISOLONE SODIUM SUCC 40 MG IJ SOLR: 40.0000 mg | Freq: Every day | INTRAMUSCULAR | Status: DC

## 2016-10-25 NOTE — Progress Notes (Signed)
Patient ID: Zariah Jost, male   DOB: 1941/07/08, 75 y.o.   MRN: [id number]  [facility] PROGRESS NOTE  Hance Caspers ENI:[id number] DOB: Apr 05, 1941 DOA: 10/20/2016 PCP: Elsie Stain, MD  HPI/Subjective: Patient states that he's breathing a little bit better today. Thinking about it more as having a little more difficulty with his left eye vision recently. Able to do a little bit more with activity.  Objective: Vitals:   10/25/16 0506 10/25/16 0955  BP: 118/62 (!) 103/44  Pulse: 86 70  Resp:  18  Temp: 98 F (36.7 C) 97.4 F (36.3 C)    Filed Weights   10/20/16 1044  Weight: 75.3 kg (166 lb)    ROS: Review of Systems  Constitutional: Negative for chills and fever.  Eyes: Positive for blurred vision.  Respiratory: Positive for cough, shortness of breath and wheezing.   Cardiovascular: Negative for chest pain.  Gastrointestinal: Negative for abdominal pain, constipation, diarrhea, nausea and vomiting.  Genitourinary: Negative for dysuria.  Musculoskeletal: Negative for joint pain.  Neurological: Negative for dizziness and headaches.   Exam: Physical Exam  Constitutional: He is oriented to person, place, and time.  HENT:  Nose: No mucosal edema.  Mouth/Throat: No oropharyngeal exudate or posterior oropharyngeal edema.  Eyes: Conjunctivae, EOM and lids are normal. Pupils are equal, round, and reactive to light.  Neck: No JVD present. Carotid bruit is not present. No edema present. No thyroid mass and no thyromegaly present.  Cardiovascular: S1 normal and S2 normal.  Exam reveals no gallop.   No murmur heard. Pulses:      Dorsalis pedis pulses are 2+ on the right side, and 2+ on the left side.  Respiratory: No accessory muscle usage. No respiratory distress. He has decreased breath sounds in the right middle field, the right lower field, the left middle field and the left lower field. He has wheezes in the right lower field and the left lower field. He has no rhonchi. He  has no rales.  GI: Soft. Bowel sounds are normal. There is no tenderness.  Musculoskeletal:       Right ankle: He exhibits no swelling.       Left ankle: He exhibits no swelling.  Lymphadenopathy:    He has no cervical adenopathy.  Neurological: He is alert and oriented to person, place, and time. No cranial nerve deficit.  Skin: Skin is warm. No rash noted. Nails show no clubbing.  Psychiatric: He has a normal mood and affect.      Data Reviewed: Basic Metabolic Panel:  Recent Labs Lab 10/20/16 1046 10/21/16 0443 10/23/16 0458 10/24/16 0421 10/25/16 0453  NA 137 138 139 137 139  K 3.5 3.2* 2.9* 3.3* 3.3*  CL 103 107 109 107 106  CO2 24 21* 22 20* 23  GLUCOSE 98 90 92 225* 146*  BUN 39* 35* 26* 27* 30*  CREATININE 2.09* 1.86* 1.95* 1.96* 1.90*  CALCIUM 8.6* 8.0* 7.9* 8.2* 8.5*   L  Recent Labs Lab 10/20/16 1046  AMYLASE 49   CBC:  Recent Labs Lab 10/20/16 1046 10/21/16 0443  WBC 9.6 8.2  NEUTROABS 8.0*  --   HGB 12.8* 12.4*  HCT 36.6* 35.3*  MCV 96.1 95.6  PLT 255 220   Cardiac Enzymes:  Recent Labs Lab 10/20/16 1046  TROPONINI <0.03   BNP (last 3 results)  Recent Labs  10/20/16 1046  BNP 93.0     Recent Results (from the past 240 hour(s))  Body fluid culture  Status: None   Collection Time: 10/20/16  4:30 PM  Result Value Ref Range Status   Specimen Description PLEURAL  Final   Special Requests NONE  Final   Gram Stain   Final    ABUNDANT WBC PRESENT,BOTH PMN AND MONONUCLEAR NO ORGANISMS SEEN    Culture   Final    NO GROWTH 3 DAYS Performed at [facility]    Report Status 10/24/2016 FINAL  Final     Studies: Dg Chest 1 View  Result Date: 10/24/2016 CLINICAL DATA:  Wheezing.  Hyperlipidemia.  Hypertension. EXAM: CHEST 1 VIEW COMPARISON:  10/20/2016 FINDINGS: Midline trachea. Mild cardiomegaly. Small right pleural effusion persists. No pneumothorax. Right greater than left interstitial edema is minimally improved on  the left. Fluid tracking in the right minor fissure. IMPRESSION: Minimal improvement in congestive heart failure with persistent small right pleural effusion. Electronically Signed   By: Abigail Miyamoto M.D.   On: 10/24/2016 08:17   US Carotid Bilateral  Result Date: 10/24/2016 CLINICAL DATA:  Small acute right occipital punctate infarct EXAM: BILATERAL CAROTID DUPLEX ULTRASOUND TECHNIQUE: Pearline Cables scale imaging, color Doppler and duplex ultrasound were performed of bilateral carotid and vertebral arteries in the neck. COMPARISON:  10/22/2016 FINDINGS: Criteria: Quantification of carotid stenosis is based on velocity parameters that correlate the residual internal carotid diameter with NASCET-based stenosis levels, using the diameter of the distal internal carotid lumen as the denominator for stenosis measurement. The following velocity measurements were obtained: RIGHT ICA:  124/38 cm/sec CCA:  30/16 cm/sec SYSTOLIC ICA/CCA RATIO:  1.8 DIASTOLIC ICA/CCA RATIO:  3.2 ECA:  113 cm/sec LEFT ICA:  80/19 cm/sec CCA:  01/0 cm/sec SYSTOLIC ICA/CCA RATIO:  1.2 DIASTOLIC ICA/CCA RATIO:  2.3 ECA:  86 cm/sec RIGHT CAROTID ARTERY: Mild echogenic shadowing plaque formation. No hemodynamically significant right ICA stenosis, velocity elevation, or turbulent flow. Degree of narrowing less than 50%. RIGHT VERTEBRAL ARTERY:  Antegrade LEFT CAROTID ARTERY: Similar scattered mild echogenic plaque formation. No hemodynamically significant left ICA stenosis, velocity elevation, or turbulent flow. LEFT VERTEBRAL ARTERY:  Antegrade IMPRESSION: Mild carotid atherosclerosis. No hemodynamically significant ICA stenosis. Degree of narrowing less than 50% bilaterally. Patent antegrade vertebral flow bilaterally Electronically Signed   By: Jerilynn Mages.  Shick M.D.   On: 10/24/2016 10:38    Scheduled Meds: . albuterol  2.5 mg Nebulization Q6H  . aspirin EC  81 mg Oral Daily  . atorvastatin  20 mg Oral Daily  . budesonide (PULMICORT) nebulizer  solution  0.5 mg Nebulization BID  . cholecalciferol  1,000 Units Oral Daily  . clopidogrel  75 mg Oral Daily  . docusate sodium  100 mg Oral BID  . enoxaparin (LOVENOX) injection  40 mg Subcutaneous Q24H  . furosemide  40 mg Intravenous Daily  . [START ON 10/26/2016] levofloxacin  250 mg Oral Daily  . methylPREDNISolone (SOLU-MEDROL) injection  40 mg Intravenous Q8H  . nystatin  5 mL Oral QID  . [START ON 10/26/2016] potassium chloride  20 mEq Oral Daily  . potassium chloride  40 mEq Oral Once  . sodium chloride flush  3 mL Intravenous Q12H  . vitamin B-12  1,000 mcg Oral Daily  . zolpidem  5 mg Oral QHS    Assessment/Plan:  1. Acute respiratory failure with hypoxia. Patient now off oxygen. 2. Fluid overload and acute diastolic congestive heart failure with pleural effusion. Patient improved with IV Lasix. Continue daily Lasix. With his potassium allergy we need to still give potassium daily. 3. 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 MRI of the brain Negative for metastases. We'll need an outpatient PET CT scan. Appreciate oncology consultation. Overall prognosis is poor and patient is a full code. 4. COPD exacerbation and possible pneumonia. Continue DuoNeb nebulizer standing dose, budesonide. Taper Solu-Medrol daily. Switch antibiotics from Zithromax to Levaquin. Respiratory status will need to be better prior to disposition. 5. Relative hypotension. Stopped antihypertensive medications 6. Chronic kidney disease stage III. Continue to monitor closely 7. Peripheral vascular disease on Plavix 8. Hyperlipidemia unspecified on atorvastatin 9. Weakness. Physical therapy consultation appreciated 10. Acute stroke seen on MRI of the brain in the right occipital lobe. Patient states he's now having some symptoms left eye. Patient on aspirin and Plavix and atorvastatin 11. Cirrhosis of the liver seen on CT scan of the chest. The hepatitis profiles are negative. Daughter  states the patient is a heavy drinker.  Code Status:     Code Status Orders        Start     Ordered   10/20/16 1403  Full code  Continuous     10/20/16 1403    Code Status History    Date Active Date Inactive Code Status Order ID Comments User Context   06/20/2016  9:48 AM 06/20/2016  2:49 PM Full Code [id number]  Algernon Huxley, MD Inpatient    Advance Directive Documentation   Flowsheet Row Most Recent Value  Type of Advance Directive  Healthcare Power of Attorney, Living will  Pre-existing out of facility DNR order (yellow form or pink MOST form)  No data  "MOST" Form in Place?  No data     Disposition Plan: Home once breathing better  Consultants:  Pulmonary  Oncology  Procedures:  Thoracentesis  Antibiotics:  Levaquin  Time spent: 24 minutes.  Loletha Grayer  [employer]

## 2016-10-25 NOTE — Progress Notes (Signed)
Pt alert. Described career as fixing manufacturing problems. Pt requested info on AD which [facility] provided. Pt will wait for daughter to return and then proceed with Ad. CH is available.   10/25/16 1145  Clinical Encounter Type  Visited With Patient  Visit Type Initial  Referral From Nurse  Spiritual Encounters  Spiritual Needs Emotional  Stress Factors  Patient Stress Factors None identified

## 2016-10-25 NOTE — Progress Notes (Signed)
Patient is observed to be sitting on the side of the bed no s/s of shortness of breath.  He denies any reaction to potassium chloride 40 meq given.  He states that he used to take it 30 years ago  And he did not remember what the adverse reaction was or if he even had a reaction, he does not remember experiencing shortness of breath after taking it.

## 2016-10-25 NOTE — Progress Notes (Signed)
PHARMACY NOTE:  ANTIMICROBIAL RENAL DOSAGE ADJUSTMENT  Current antimicrobial regimen includes a mismatch between antimicrobial dosage and estimated renal function.    Current antimicrobial dosage:  levaquin 500 mg PO daily  Indication: COPD exacerbation poss PNA  Renal Function:  Estimated Creatinine Clearance: 31.6 mL/min (by C-G formula based on SCr of 1.9 mg/dL (H)). '[]'$      On intermittent HD, scheduled: '[]'$      On CRRT    Antimicrobial dosage has been changed to:  levaquin 250 mg po daily    Thank you for allowing pharmacy to be a part of this patient's care.  Rocky Morel, [facility] 10/25/2016 11:28 AM

## 2016-10-26 LAB — BASIC METABOLIC PANEL
ANION GAP: 10 (ref 5–15)
BUN: 34 mg/dL — ABNORMAL HIGH (ref 6–20)
CHLORIDE: 104 mmol/L (ref 101–111)
CO2: 24 mmol/L (ref 22–32)
CREATININE: 2.16 mg/dL — AB (ref 0.61–1.24)
Calcium: 8.4 mg/dL — ABNORMAL LOW (ref 8.9–10.3)
GFR calc non Af Amer: 28 mL/min — ABNORMAL LOW (ref >60)
GFR, EST AFRICAN AMERICAN: 33 mL/min — AB (ref >60)
Glucose, Bld: 86 mg/dL (ref 65–99)
Potassium: 3.4 mmol/L — ABNORMAL LOW (ref 3.5–5.1)
SODIUM: 138 mmol/L (ref 135–145)

## 2016-10-26 LAB — CHOLESTEROL, BODY FLUID: CHOL FL: 114 mg/dL

## 2016-10-26 LAB — MYCOPLASMA PNEUMONIAE ANTIBODY, IGM

## 2016-10-26 LAB — LIPID PANEL
CHOLESTEROL: 155 mg/dL (ref 0–200)
HDL: 64 mg/dL (ref >40)
LDL Cholesterol: 67 mg/dL (ref 0–99)
Total CHOL/HDL Ratio: 2.4 RATIO
Triglycerides: 122 mg/dL (ref <150)
VLDL: 24 mg/dL (ref 0–40)

## 2016-10-26 MED ORDER — LEVOFLOXACIN 250 MG PO TABS: 250.0000 mg | ORAL_TABLET | Freq: Every day | ORAL | 0 refills | Status: DC

## 2016-10-26 MED ORDER — PREDNISONE 5 MG PO TABS: ORAL_TABLET | ORAL | 0 refills | Status: AC

## 2016-10-26 MED ORDER — PREDNISONE 20 MG PO TABS
30.0000 mg | ORAL_TABLET | Freq: Every day | ORAL | Status: DC
  Administered 2016-10-26: 30 mg via ORAL
  Filled 2016-10-26: qty 1

## 2016-10-26 MED ORDER — LEVOTHYROXINE SODIUM 50 MCG PO TABS: 50.0000 ug | ORAL_TABLET | Freq: Every day | ORAL | 0 refills | Status: AC

## 2016-10-26 MED ORDER — FUROSEMIDE 40 MG PO TABS: 40.0000 mg | ORAL_TABLET | Freq: Every day | ORAL | 0 refills | Status: DC

## 2016-10-26 MED ORDER — POTASSIUM CHLORIDE CRYS ER 20 MEQ PO TBCR: 20.0000 meq | EXTENDED_RELEASE_TABLET | Freq: Every day | ORAL | 0 refills | Status: DC

## 2016-10-26 MED ORDER — LEVOTHYROXINE SODIUM 50 MCG PO TABS: 50.0000 ug | ORAL_TABLET | Freq: Every day | ORAL | Status: DC

## 2016-10-26 MED ORDER — ATORVASTATIN CALCIUM 40 MG PO TABS: 40.0000 mg | ORAL_TABLET | Freq: Every day | ORAL | 0 refills | Status: AC

## 2016-10-26 MED ORDER — NYSTATIN 100000 UNIT/ML MT SUSP: 5.0000 mL | Freq: Four times a day (QID) | OROMUCOSAL | 0 refills | Status: AC

## 2016-10-26 MED ORDER — FUROSEMIDE 40 MG PO TABS
40.0000 mg | ORAL_TABLET | Freq: Every day | ORAL | Status: DC
  Administered 2016-10-26: 40 mg via ORAL
  Filled 2016-10-26: qty 1

## 2016-10-26 MED ORDER — ALBUTEROL SULFATE (2.5 MG/3ML) 0.083% IN NEBU: 2.5000 mg | INHALATION_SOLUTION | Freq: Four times a day (QID) | RESPIRATORY_TRACT | 0 refills | Status: AC

## 2016-10-26 MED ORDER — ALBUTEROL SULFATE HFA 108 (90 BASE) MCG/ACT IN AERS: 2.0000 | INHALATION_SPRAY | Freq: Four times a day (QID) | RESPIRATORY_TRACT | 0 refills | Status: AC | PRN

## 2016-10-26 MED ORDER — ENOXAPARIN SODIUM 30 MG/0.3ML ~~LOC~~ SOLN: 30.0000 mg | SUBCUTANEOUS | Status: DC

## 2016-10-26 NOTE — Progress Notes (Signed)
Pt alert and expecting discharge today. Daughter will pick up. Pt had worked 30 hrs a week at [employer] until 2 months ago but downward spiral in health since then.  CH is available.   10/26/16 1045  Clinical Encounter Type  Visited With Patient  Visit Type Follow-up  Referral From Chaplain  Spiritual Encounters  Spiritual Needs Emotional

## 2016-10-26 NOTE — Discharge Instructions (Signed)
Metastatic Cancer Introduction What increases the risk? Your risk for metastatic cancer depends on:  The type of cancer that you have.  The stage and grade of your primary cancer at the time of diagnosis. Tumors are graded by looking at tumor cells under a microscope. Grading predicts how quickly the tumor cells will grow. Your health care provider will use the stage and the grade of your primary cancer to determine the chances of metastasis. This helps your health care provider to find the best treatment for you.  Risk for metastasis may go up with:  A larger primary tumor.  A higher grade of tumor.  Deeper growth of tumor.  Lymph node involvement. How is this diagnosed? Your health care provider may suspect metastatic cancer from your signs and symptoms. Some people do not have any symptoms. Their cancer is found through imaging or other tests.  Symptoms may include:  Weakness.  Lack of energy.  Pain.  Weight loss.  Trouble breathing.  Signs may include:  Fluid buildup in your lungs or belly.  Tumor growths that can be felt or seen.  An enlarged liver. Your health care provider will also do a physical exam. This may include: How is this treated? There are many options for treating metastatic cancer. Your treatment will depend on:  The type of cancer that you have.  How far your cancer has advanced.  Your general health. Treatment may not be able to cure metastatic cancer, but it can often relieve the symptoms. In many cases, you may have a combination of treatments. Options may include:  Surgery.  Cancer-killing drugs (chemotherapy).  X-ray treatment (radiation therapy).  Hormone therapy.  Treatments that help your body to fight cancer (biologic therapy). How is this prevented? The only way to prevent metastatic cancer is to find your primary cancer early and treat it successfully. Talk with your health care provider about cancer screening. Screening  exams for early detection are available for some types of cancer, including:  Breast.  Colon.  Prostate.  Lung.  Cervical. Where to find more information: The following websites provide more information.  [place] ([place]) http://www.hicks.com/  [place] (ACS) http://www.cancer.org/treatment/understandingyourdiagnosis/advancedcancer/advanced-cancer-what-is-metastatic  This information is not intended to replace advice given to you by your health care provider. Make sure you discuss any questions you have with your health care provider. Document Released: 02/21/2005 Document Revised: 05/05/2016 Document Reviewed: 01/30/2014  2017 Elsevier Pleural Effusion A pleural effusion is an abnormal buildup of fluid in the layers of tissue between your lungs and the inside of your chest (pleural space). These two layers of tissue that line both your lungs and the inside of your chest are called pleura. Usually, there is no air in the space between the pleura, only a thin layer of fluid. If left untreated, a large amount of fluid can build up and cause the lung to collapse. A pleural effusion is usually caused by another disease that requires treatment. The two main types of pleural effusion are:  Transudative pleural effusion. This happens when fluid leaks into the pleural space because of a low protein count in your blood or high blood pressure in your vessels. Heart failure often causes this.  Exudative infusion. This occurs when fluid collects in the pleural space from blocked blood vessels or lymph vessels. Some lung diseases, injuries, and cancers can cause this type of effusion. What are the causes? Pleural effusion can be caused by:  Heart failure.  A blood clot  in the lung (pulmonary embolism).  Pneumonia.  Cancer.  Liver failure (cirrhosis).  Kidney disease.  Complications from surgery, such as from  open heart surgery. What are the signs or symptoms? In some cases, pleural effusion may cause no symptoms. Symptoms can include:  Shortness of breath, especially when lying down.  Chest pain, often worse when taking a deep breath.  Fever.  Dry cough that is lasting (chronic).  Hiccups.  Rapid breathing. An underlying condition that is causing the pleural effusion (such as heart failure, pneumonia, blood clots, tuberculosis, or cancer) may also cause additional symptoms. How is this diagnosed? Your health care provider may suspect pleural effusion based on your symptoms and medical history. Your health care provider will also do a physical exam and a chest X-ray. If the X-ray shows there is fluid in your chest, you may need to have this fluid removed using a needle (thoracentesis) so it can be tested. You may also have:  Imaging studies of the chest, such as:  Ultrasound.  CT scan.  Blood tests for kidney and liver function. How is this treated? Treatment depends on the cause of the pleural effusion. Treatment may include:  Taking antibiotic medicines to clear up an infection that is causing the pleural effusion.  Placing a tube in the chest to drain the effusion (tube thoracostomy). This procedure is often used when there is an infection in the fluid.  Surgery to remove the fibrous outer layer of tissue from the pleural space (decortication).  Thoracentesis, which can improve cough and shortness of breath.  A procedure to put medicine into the chest cavity to seal the pleural space to prevent fluid buildup (pleurodesis).  Chemotherapy and radiation therapy. These may be required in the case of cancerous (malignant) pleural effusion. Follow these instructions at home:  Take medicines only as directed by your health care provider.  Keep track of how long you can gently exercise before you get short of breath. Try simply walking at first.  Do not use any tobacco products,  including cigarettes, chewing tobacco, or electronic cigarettes. If you need help quitting, ask your health care provider.  Keep all follow-up visits as directed by your health care provider. This is important. Contact a health care provider if:  The amount of time that you are able to exercise decreases or does not improve with time.  You have pain or signs of infection at the puncture site if you had thoracentesis. Watch for:  Drainage.  Redness.  Swelling.  You have a fever. Get help right away if:  You are short of breath.  You develop chest pain.  You develop a new cough. This information is not intended to replace advice given to you by your health care provider. Make sure you discuss any questions you have with your health care provider. Document Released: 10/17/2005 Document Revised: 03/21/2016 Document Reviewed: 03/12/2014 Elsevier Interactive Patient Education  2017 [employer].

## 2016-10-26 NOTE — Care Management Important Message (Signed)
Important Message  Patient Details  Name: Edwin Moran MRN: [id number] Date of Birth: 21-Sep-1941   Medicare Important Message Given:  Yes    Beverly Sessions, RN 10/26/2016, 11:21 AM

## 2016-10-26 NOTE — Progress Notes (Signed)
Patient completed an Advance Directive and he wanted Chaplain On-Call to help for notarization. After gathering two witnesses notary Judie Petit notarized the document. Patient was discharged today.

## 2016-10-26 NOTE — Discharge Summary (Signed)
[place] [facility] [place] NAME: Edwin Moran    MR#:  [id number]  DATE OF BIRTH:  03-03-1941  DATE OF ADMISSION:  10/20/2016 ADMITTING PHYSICIAN: Bettey Costa, MD  DATE OF DISCHARGE: 10/26/2016  PRIMARY CARE PHYSICIAN: Elsie Stain, MD    ADMISSION DIAGNOSIS:  SOB (shortness of breath) [R06.02] Pleural effusion [J90] Hypoxia [R09.02] Status post thoracentesis [Z98.890]  DISCHARGE DIAGNOSIS:  Active Problems:   Pleural effusion   SECONDARY DIAGNOSIS:   Past Medical History:  Diagnosis Date  . Cancer ([place])    skin  . Complication of anesthesia 1963   pt woke up during the procedure choking on drainage.  Marland Kitchen COPD (chronic obstructive pulmonary disease) ([place])   . Diverticulosis of colon   . GERD (gastroesophageal reflux disease)   . History of GI diverticular bleed 03/20-03/31/04   [facility]  . History of hiatal hernia   . Hyperlipidemia   . Hypertension   . Osteoarthritis   . Peripheral vascular disease ([place])    right foot tunred purole  . PONV (postoperative nausea and vomiting)    at age 89 with finger surgery.  . Renal insufficiency   . Smoking     HOSPITAL COURSE:   1.  Acute respiratory failure with hypoxia. The patient required oxygen during most of the hospital stay. Patient is now off oxygen. 2.  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 Most likely it is a lung primary. PET CT scan can be done by Dr. Grayland Ormond as outpatient and consideration of treatment as outpatient. Overall prognosis is poor. Patient is a full code. Diagnosis was made with a thoracentesis cytology. 3. Acute diastolic congestive heart failure with pleural effusion. The patient finally started getting better with regards to his respiratory status once I started IV Lasix. Continue oral Lasix as outpatient along with potassium. His blood pressure remains too low for any other medications to be given at this point in time. No beta blocker secondary to  bronchospasm and hypotension. No ACE inhibitor with chronic kidney disease and hypotension. 4. COPD exacerbation and possible pneumonia. I prescribed nebulizer upon going home. Patient will go back on his Symbicort. I prescribed a prednisone taper. During the hospital course he was initially on Zithromax any changes in antibiotic to Levaquin and this will be continued for a few more days. The patient was on IV Solu-Medrol and standing dose nebulizer and budesonide nebulizers. 5. Relative hypotension. I stopped his usual antihypertensive medications 6. Chronic kidney disease stage III continue to monitor closely as outpatient. 7. Peripheral vascular disease on aspirin and Plavix 8. Hyperlipidemia unspecified on atorvastatin 9. Weakness. Physical therapy recommended home with home health. 10. Acute stroke seen on MRI of the brain in the right occipital lobe with left visual symptoms. The patient is already on aspirin and Plavix and atorvastatin 11. Cirrhosis of the liver seen on CT scan of the chest. Hepatitis profiles are negative. Her daughter states he was a heavy drinker as outpatient. 12. Severe hypothyroidism. This may be contributing to his fluid issues. Start Synthroid 50 g daily. He will likely need up to 150 g daily but this has to be titrated up slowly. This can be done as outpatient. TSH in hospital was 187.  DISCHARGE CONDITIONS:   Fair  CONSULTS OBTAINED:  Treatment Team:  Allyne Gee, MD Lloyd Huger, MD  DRUG ALLERGIES:   Allergies  Allergen Reactions  . Cefprozil     REACTION: Swelling  . Penicillins Swelling  Has patient had a PCN reaction causing immediate rash, facial/tongue/throat swelling, SOB or lightheadedness with hypotension: Yes Has patient had a PCN reaction causing severe rash involving mucus membranes or skin necrosis: No Has patient had a PCN reaction that required hospitalization No Has patient had a PCN reaction occurring within the last 10  years: No If all of the above answers are "NO", then may proceed with Cephalosporin use.   Marland Kitchen Potassium Chloride     REACTION: SOB    DISCHARGE MEDICATIONS:   Current Discharge Medication List    START taking these medications   Details  albuterol (PROVENTIL) (2.5 MG/3ML) 0.083% nebulizer solution Take 3 mLs (2.5 mg total) by nebulization 4 (four) times daily. Qty: 360 mL, Refills: 0    furosemide (LASIX) 40 MG tablet Take 1 tablet (40 mg total) by mouth daily. Qty: 30 tablet, Refills: 0    levofloxacin (LEVAQUIN) 250 MG tablet Take 1 tablet (250 mg total) by mouth daily. Qty: 3 tablet, Refills: 0    levothyroxine (SYNTHROID, LEVOTHROID) 50 MCG tablet Take 1 tablet (50 mcg total) by mouth daily before breakfast. Qty: 30 tablet, Refills: 0    nystatin (MYCOSTATIN) 100000 UNIT/ML suspension Take 5 mLs (500,000 Units total) by mouth 4 (four) times daily. Qty: 60 mL, Refills: 0    potassium chloride SA (K-DUR,KLOR-CON) 20 MEQ tablet Take 1 tablet (20 mEq total) by mouth daily. Qty: 30 tablet, Refills: 0    predniSONE (DELTASONE) 5 MG tablet 4 tabs po day1; 3 tabs po day2; 2 tabs po day3; 1 tab po day4,5 then stop Qty: 11 tablet, Refills: 0      CONTINUE these medications which have CHANGED   Details  albuterol (PROVENTIL HFA;VENTOLIN HFA) 108 (90 Base) MCG/ACT inhaler Inhale 2 puffs into the lungs every 6 (six) hours as needed for wheezing or shortness of breath. Qty: 1 Inhaler, Refills: 0    atorvastatin (LIPITOR) 40 MG tablet Take 1 tablet (40 mg total) by mouth daily. Qty: 30 tablet, Refills: 0      CONTINUE these medications which have NOT CHANGED   Details  aspirin EC 81 MG tablet Take 1 tablet (81 mg total) by mouth daily. Qty: 150 tablet, Refills: 2    budesonide-formoterol (SYMBICORT) 160-4.5 MCG/ACT inhaler Inhale 2 puffs into the lungs 2 (two) times daily. Qty: 3 Inhaler, Refills: 3    cholecalciferol (VITAMIN D) 1000 UNITS tablet Take 1,000 Units by mouth  daily.    clopidogrel (PLAVIX) 75 MG tablet Take 1 tablet (75 mg total) by mouth daily. Qty: 30 tablet, Refills: 11    docusate sodium (COLACE) 100 MG capsule Take 1 capsule (100 mg total) by mouth 2 (two) times daily. Qty: 60 capsule, Refills: 0    GLUCOSAMINE CHONDROITIN COMPLX PO Take 2 tablets by mouth daily.     oxybutynin (DITROPAN) 5 MG tablet Take 1 tablet (5 mg total) by mouth every 8 (eight) hours as needed for bladder spasms. Qty: 30 tablet, Refills: 0    vitamin B-12 (CYANOCOBALAMIN) 1000 MCG tablet Take 1,000 mcg by mouth daily.      HYDROcodone-acetaminophen (NORCO/VICODIN) 5-325 MG tablet Take 1-2 tablets by mouth every 6 (six) hours as needed for moderate pain. Qty: 15 tablet, Refills: 0      STOP taking these medications     amLODipine (NORVASC) 10 MG tablet      bisoprolol-hydrochlorothiazide (ZIAC) 5-6.25 MG tablet      quinapril (ACCUPRIL) 40 MG tablet  DISCHARGE INSTRUCTIONS:   Follow-up with PMD to 3 weeks Follow-up with oncology Dr. Grayland Ormond one week  If you experience worsening of your admission symptoms, develop shortness of breath, life threatening emergency, suicidal or homicidal thoughts you must seek medical attention immediately by calling 911 or calling your MD immediately  if symptoms less severe.  You Must read complete instructions/literature along with all the possible adverse reactions/side effects for all the Medicines you take and that have been prescribed to you. Take any new Medicines after you have completely understood and accept all the possible adverse reactions/side effects.   Please note  You were cared for by a hospitalist during your hospital stay. If you have any questions about your discharge medications or the care you received while you were in the hospital after you are discharged, you can call the unit and asked to speak with the hospitalist on call if the hospitalist that took care of you is not available. Once  you are discharged, your primary care physician will handle any further medical issues. Please note that NO REFILLS for any discharge medications will be authorized once you are discharged, as it is imperative that you return to your primary care physician (or establish a relationship with a primary care physician if you do not have one) for your aftercare needs so that they can reassess your need for medications and monitor your lab values.    Today   CHIEF COMPLAINT:   Chief Complaint  Patient presents with  . Shortness of Breath    HISTORY OF PRESENT ILLNESS:  Edwin Moran  is a 75 y.o. male presented with shortness of breath and found to have a pleural effusion which ended up being malignant   VITAL SIGNS:  Blood pressure 129/74, pulse 81, temperature 98.2 F (36.8 C), temperature source Oral, resp. rate 18, height '5\' 4"'$  (1.626 m), weight 75.3 kg (166 lb), SpO2 90 %.   PHYSICAL EXAMINATION:  GENERAL:  75 y.o.-year-old patient lying in the bed with no acute distress.  EYES: Pupils equal, round, reactive to light and accommodation. No scleral icterus. Extraocular muscles intact.  HEENT: Head atraumatic, normocephalic. Oropharynx and nasopharynx clear.  NECK:  Supple, no jugular venous distention. No thyroid enlargement, no tenderness.  LUNGS: Decreased breath sounds bilaterally, no wheezing, rales,rhonchi or crepitation. No use of accessory muscles of respiration.  CARDIOVASCULAR: S1, S2 normal. No murmurs, rubs, or gallops.  ABDOMEN: Soft, non-tender, non-distended. Bowel sounds present. No organomegaly or mass.  EXTREMITIES: Trace edema, no cyanosis, or clubbing.  NEUROLOGIC: Cranial nerves II through XII are intact. Muscle strength 5/5 in all extremities. Sensation intact. Gait not checked.  PSYCHIATRIC: The patient is alert and oriented x 3.  SKIN: No obvious rash, lesion, or ulcer.   DATA REVIEW:   CBC  Recent Labs Lab 10/21/16 0443  WBC 8.2  HGB 12.4*  HCT 35.3*   PLT 220    Chemistries   Recent Labs Lab 10/26/16 0445  NA 138  K 3.4*  CL 104  CO2 24  GLUCOSE 86  BUN 34*  CREATININE 2.16*  CALCIUM 8.4*    Cardiac Enzymes  Recent Labs Lab 10/20/16 1046  TROPONINI <0.03    Microbiology Results  Results for orders placed or performed during the hospital encounter of 10/20/16  Body fluid culture     Status: None   Collection Time: 10/20/16  4:30 PM  Result Value Ref Range Status   Specimen Description PLEURAL  Final   Special Requests NONE  Final   Gram Stain   Final    ABUNDANT WBC PRESENT,BOTH PMN AND MONONUCLEAR NO ORGANISMS SEEN    Culture   Final    NO GROWTH 3 DAYS Performed at [facility]    Report Status 10/24/2016 FINAL  Final      Management plans discussed with the patient, family and they are in agreement.  CODE STATUS:     Code Status Orders        Start     Ordered   10/20/16 1403  Full code  Continuous     10/20/16 1403    Code Status History    Date Active Date Inactive Code Status Order ID Comments User Context   06/20/2016  9:48 AM 06/20/2016  2:49 PM Full Code [id number]  Algernon Huxley, MD Inpatient    Advance Directive Documentation   Flowsheet Row Most Recent Value  Type of Advance Directive  Healthcare Power of Attorney, Living will  Pre-existing out of facility DNR order (yellow form or pink MOST form)  No data  "MOST" Form in Place?  No data      TOTAL TIME TAKING CARE OF THIS PATIENT: 35 minutes.    Loletha Grayer M.D on 10/26/2016 at 2:43 PM  Between 7am to 6pm - Pager - [phone]  After 6pm go to [email or web address] - password [email or web address] [facility]  [phone]  CC: Primary care physician; Elsie Stain, MD

## 2016-10-26 NOTE — Care Management (Signed)
Patient admitted with Acute respiratory failure with hypoxia.  Patient lives at home with daughter and son in [place].  PCP [facility].  Pharmacy [facility].  Patient to discharge with home health services. Order has been written for RW and nebulizer.  I have provided patient and daughter with choice of home health agency.  States no agency preference.   Referral made to [facility] with [facility].  Walker and nebulizer to be delivered to room.  Daughter to transport at discharge.  RNCM signing off

## 2016-10-26 NOTE — Progress Notes (Signed)
Patient discharged home with daughter, discharge instructions and appointment reviewed with the patient and his daughter, discharge prescriptions reviewed with patient.  Patient requests pastoral help with completing of a health care power of attorney and this was provided.  Patient is in stable condition at time of discharge

## 2016-10-26 NOTE — Progress Notes (Signed)
Lovenox changed to 30 mg daily for CrCl <30

## 2016-10-27 LAB — CYTOLOGY - NON PAP

## 2016-10-28 ENCOUNTER — Telehealth: Payer: Self-pay

## 2016-10-28 NOTE — Telephone Encounter (Signed)
Transition Care Management Follow-up Telephone Call   Date discharged? 10/26/16   How have you been since you were released from the hospital? "pretty good"   Do you understand why you were in the hospital? yes, "I couldn't breathe"   Do you understand the discharge instructions? yes   Where were you discharged to? Home   Items Reviewed:  Medications reviewed: no, pt declines medication review, stating he's going by the instructions he was given at the hospital.   Allergies reviewed: yes  Dietary changes reviewed: no change.   Referrals reviewed: no   Functional Questionnaire:   Activities of Daily Living (ADLs):   He states they are independent in the following: ambulation, bathing and hygiene, feeding, continence, grooming, toileting and dressing. Using walker to ambulate.  States they require assistance with the following: none   Any transportation issues/concerns?: no   Any patient concerns? No.    Confirmed importance and date/time of follow-up visits scheduled yes  Provider Appointment booked with Dr. Damita Dunnings 11/08/2016 @ 2pm.   Confirmed with patient if condition begins to worsen call PCP or go to the ER.  Patient was given the office number and encouraged to call back with question or concerns.  : yes

## 2016-10-29 ENCOUNTER — Inpatient Hospital Stay
Admission: EM | Admit: 2016-10-29 | Discharge: 2016-11-03 | DRG: 441 | Disposition: A | Discharge status: Other Acute Inpatient Hospital | Payer: Medicare Other | Attending: Internal Medicine | Admitting: Internal Medicine

## 2016-10-29 ENCOUNTER — Emergency Department: Payer: Medicare Other

## 2016-10-29 DIAGNOSIS — I130 Hypertensive heart and chronic kidney disease with heart failure and stage 1 through stage 4 chronic kidney disease, or unspecified chronic kidney disease: Secondary | ICD-10-CM | POA: Diagnosis present

## 2016-10-29 DIAGNOSIS — K219 Gastro-esophageal reflux disease without esophagitis: Secondary | ICD-10-CM | POA: Diagnosis present

## 2016-10-29 DIAGNOSIS — Z825 Family history of asthma and other chronic lower respiratory diseases: Secondary | ICD-10-CM

## 2016-10-29 DIAGNOSIS — I739 Peripheral vascular disease, unspecified: Secondary | ICD-10-CM | POA: Diagnosis present

## 2016-10-29 DIAGNOSIS — R109 Unspecified abdominal pain: Secondary | ICD-10-CM | POA: Diagnosis not present

## 2016-10-29 DIAGNOSIS — M6281 Muscle weakness (generalized): Secondary | ICD-10-CM

## 2016-10-29 DIAGNOSIS — D62 Acute posthemorrhagic anemia: Secondary | ICD-10-CM | POA: Diagnosis present

## 2016-10-29 DIAGNOSIS — X58XXXA Exposure to other specified factors, initial encounter: Secondary | ICD-10-CM | POA: Diagnosis not present

## 2016-10-29 DIAGNOSIS — K7460 Unspecified cirrhosis of liver: Secondary | ICD-10-CM | POA: Diagnosis present

## 2016-10-29 DIAGNOSIS — N183 Chronic kidney disease, stage 3 (moderate): Secondary | ICD-10-CM | POA: Diagnosis present

## 2016-10-29 DIAGNOSIS — Z87891 Personal history of nicotine dependence: Secondary | ICD-10-CM | POA: Diagnosis not present

## 2016-10-29 DIAGNOSIS — I959 Hypotension, unspecified: Secondary | ICD-10-CM | POA: Diagnosis present

## 2016-10-29 DIAGNOSIS — C770 Secondary and unspecified malignant neoplasm of lymph nodes of head, face and neck: Secondary | ICD-10-CM | POA: Diagnosis present

## 2016-10-29 DIAGNOSIS — N400 Enlarged prostate without lower urinary tract symptoms: Secondary | ICD-10-CM | POA: Diagnosis present

## 2016-10-29 DIAGNOSIS — J9601 Acute respiratory failure with hypoxia: Secondary | ICD-10-CM | POA: Diagnosis present

## 2016-10-29 DIAGNOSIS — C3490 Malignant neoplasm of unspecified part of unspecified bronchus or lung: Secondary | ICD-10-CM | POA: Diagnosis not present

## 2016-10-29 DIAGNOSIS — R339 Retention of urine, unspecified: Secondary | ICD-10-CM | POA: Diagnosis not present

## 2016-10-29 DIAGNOSIS — R32 Unspecified urinary incontinence: Secondary | ICD-10-CM | POA: Diagnosis present

## 2016-10-29 DIAGNOSIS — E039 Hypothyroidism, unspecified: Secondary | ICD-10-CM | POA: Diagnosis present

## 2016-10-29 DIAGNOSIS — R338 Other retention of urine: Secondary | ICD-10-CM | POA: Diagnosis not present

## 2016-10-29 DIAGNOSIS — J910 Malignant pleural effusion: Secondary | ICD-10-CM | POA: Diagnosis present

## 2016-10-29 DIAGNOSIS — R0602 Shortness of breath: Secondary | ICD-10-CM | POA: Diagnosis not present

## 2016-10-29 DIAGNOSIS — M549 Dorsalgia, unspecified: Secondary | ICD-10-CM | POA: Diagnosis not present

## 2016-10-29 DIAGNOSIS — I5032 Chronic diastolic (congestive) heart failure: Secondary | ICD-10-CM | POA: Diagnosis present

## 2016-10-29 DIAGNOSIS — Z888 Allergy status to other drugs, medicaments and biological substances status: Secondary | ICD-10-CM

## 2016-10-29 DIAGNOSIS — J189 Pneumonia, unspecified organism: Secondary | ICD-10-CM | POA: Diagnosis present

## 2016-10-29 DIAGNOSIS — J449 Chronic obstructive pulmonary disease, unspecified: Secondary | ICD-10-CM | POA: Diagnosis not present

## 2016-10-29 DIAGNOSIS — R262 Difficulty in walking, not elsewhere classified: Secondary | ICD-10-CM

## 2016-10-29 DIAGNOSIS — J440 Chronic obstructive pulmonary disease with acute lower respiratory infection: Secondary | ICD-10-CM | POA: Diagnosis present

## 2016-10-29 DIAGNOSIS — N179 Acute kidney failure, unspecified: Secondary | ICD-10-CM

## 2016-10-29 DIAGNOSIS — Z8249 Family history of ischemic heart disease and other diseases of the circulatory system: Secondary | ICD-10-CM

## 2016-10-29 DIAGNOSIS — Y95 Nosocomial condition: Secondary | ICD-10-CM | POA: Diagnosis present

## 2016-10-29 DIAGNOSIS — R0902 Hypoxemia: Secondary | ICD-10-CM

## 2016-10-29 DIAGNOSIS — K7689 Other specified diseases of liver: Secondary | ICD-10-CM | POA: Diagnosis not present

## 2016-10-29 DIAGNOSIS — N170 Acute kidney failure with tubular necrosis: Secondary | ICD-10-CM | POA: Diagnosis present

## 2016-10-29 DIAGNOSIS — Z85828 Personal history of other malignant neoplasm of skin: Secondary | ICD-10-CM

## 2016-10-29 DIAGNOSIS — M1990 Unspecified osteoarthritis, unspecified site: Secondary | ICD-10-CM | POA: Diagnosis not present

## 2016-10-29 DIAGNOSIS — N1330 Unspecified hydronephrosis: Secondary | ICD-10-CM | POA: Diagnosis present

## 2016-10-29 DIAGNOSIS — D649 Anemia, unspecified: Secondary | ICD-10-CM | POA: Diagnosis present

## 2016-10-29 DIAGNOSIS — T148XXA Other injury of unspecified body region, initial encounter: Secondary | ICD-10-CM | POA: Diagnosis present

## 2016-10-29 DIAGNOSIS — E785 Hyperlipidemia, unspecified: Secondary | ICD-10-CM | POA: Diagnosis present

## 2016-10-29 DIAGNOSIS — Z880 Allergy status to penicillin: Secondary | ICD-10-CM

## 2016-10-29 DIAGNOSIS — I10 Essential (primary) hypertension: Secondary | ICD-10-CM | POA: Diagnosis not present

## 2016-10-29 LAB — URINALYSIS, COMPLETE (UACMP) WITH MICROSCOPIC
Bacteria, UA: NONE SEEN
Bilirubin Urine: NEGATIVE
Glucose, UA: NEGATIVE mg/dL
KETONES UR: NEGATIVE mg/dL
Nitrite: NEGATIVE
PH: 5 (ref 5.0–8.0)
Protein, ur: NEGATIVE mg/dL
SPECIFIC GRAVITY, URINE: 1.013 (ref 1.005–1.030)
SQUAMOUS EPITHELIAL / LPF: NONE SEEN

## 2016-10-29 LAB — HEMOGLOBIN: Hemoglobin: 7.7 g/dL — ABNORMAL LOW (ref 13.0–18.0)

## 2016-10-29 LAB — CBC
HCT: 31.9 % — ABNORMAL LOW (ref 40.0–52.0)
Hemoglobin: 11.5 g/dL — ABNORMAL LOW (ref 13.0–18.0)
MCH: 34.4 pg — AB (ref 26.0–34.0)
MCHC: 35.9 g/dL (ref 32.0–36.0)
MCV: 95.8 fL (ref 80.0–100.0)
PLATELETS: 348 10*3/uL (ref 150–440)
RBC: 3.33 MIL/uL — ABNORMAL LOW (ref 4.40–5.90)
RDW: 14.6 % — AB (ref 11.5–14.5)
WBC: 10.8 10*3/uL — ABNORMAL HIGH (ref 3.8–10.6)

## 2016-10-29 LAB — COMPREHENSIVE METABOLIC PANEL
ALK PHOS: 80 U/L (ref 38–126)
ALT: 66 U/L — AB (ref 17–63)
AST: 65 U/L — AB (ref 15–41)
Albumin: 3.2 g/dL — ABNORMAL LOW (ref 3.5–5.0)
Anion gap: 14 (ref 5–15)
BUN: 54 mg/dL — AB (ref 6–20)
CALCIUM: 9.7 mg/dL (ref 8.9–10.3)
CHLORIDE: 97 mmol/L — AB (ref 101–111)
CO2: 24 mmol/L (ref 22–32)
CREATININE: 2.58 mg/dL — AB (ref 0.61–1.24)
GFR calc Af Amer: 27 mL/min — ABNORMAL LOW (ref >60)
GFR, EST NON AFRICAN AMERICAN: 23 mL/min — AB (ref >60)
Glucose, Bld: 144 mg/dL — ABNORMAL HIGH (ref 65–99)
Potassium: 3.8 mmol/L (ref 3.5–5.1)
SODIUM: 135 mmol/L (ref 135–145)
Total Bilirubin: 0.8 mg/dL (ref 0.3–1.2)
Total Protein: 6.5 g/dL (ref 6.5–8.1)

## 2016-10-29 LAB — LACTIC ACID, PLASMA
Lactic Acid, Venous: 2.6 mmol/L (ref 0.5–1.9)
Lactic Acid, Venous: 4.2 mmol/L (ref 0.5–1.9)

## 2016-10-29 LAB — TROPONIN I: Troponin I: 0.03 ng/mL (ref <0.03)

## 2016-10-29 LAB — TSH: TSH: 184 u[IU]/mL — AB (ref 0.350–4.500)

## 2016-10-29 LAB — MRSA PCR SCREENING: MRSA by PCR: NEGATIVE

## 2016-10-29 MED ORDER — ALUM & MAG HYDROXIDE-SIMETH 200-200-20 MG/5ML PO SUSP
30.0000 mL | Freq: Four times a day (QID) | ORAL | Status: DC | PRN
  Administered 2016-10-29 – 2016-10-30 (×3): 30 mL via ORAL
  Filled 2016-10-29 (×4): qty 30

## 2016-10-29 MED ORDER — ATORVASTATIN CALCIUM 20 MG PO TABS
40.0000 mg | ORAL_TABLET | Freq: Every evening | ORAL | Status: DC
  Administered 2016-10-29 – 2016-11-02 (×5): 40 mg via ORAL
  Filled 2016-10-29 (×5): qty 2

## 2016-10-29 MED ORDER — DOCUSATE SODIUM 100 MG PO CAPS
100.0000 mg | ORAL_CAPSULE | Freq: Two times a day (BID) | ORAL | Status: DC
  Administered 2016-10-29 – 2016-11-02 (×9): 100 mg via ORAL
  Filled 2016-10-29 (×9): qty 1

## 2016-10-29 MED ORDER — VANCOMYCIN HCL IN DEXTROSE 1-5 GM/200ML-% IV SOLN
1000.0000 mg | Freq: Once | INTRAVENOUS | Status: AC
  Administered 2016-10-29: 1000 mg via INTRAVENOUS
  Filled 2016-10-29: qty 200

## 2016-10-29 MED ORDER — MORPHINE SULFATE (PF) 2 MG/ML IV SOLN
1.0000 mg | INTRAVENOUS | Status: DC | PRN
  Administered 2016-10-29: 1 mg via INTRAVENOUS
  Filled 2016-10-29: qty 1

## 2016-10-29 MED ORDER — DEXTROSE 5 % IV SOLN
1.0000 g | Freq: Two times a day (BID) | INTRAVENOUS | Status: DC
  Administered 2016-10-29 – 2016-10-31 (×4): 1 g via INTRAVENOUS
  Filled 2016-10-29 (×7): qty 1

## 2016-10-29 MED ORDER — ALBUTEROL SULFATE (2.5 MG/3ML) 0.083% IN NEBU
2.5000 mg | INHALATION_SOLUTION | Freq: Four times a day (QID) | RESPIRATORY_TRACT | Status: DC
  Administered 2016-10-29: 2.5 mg via RESPIRATORY_TRACT
  Filled 2016-10-29: qty 3

## 2016-10-29 MED ORDER — MOMETASONE FURO-FORMOTEROL FUM 200-5 MCG/ACT IN AERO
2.0000 | INHALATION_SPRAY | Freq: Two times a day (BID) | RESPIRATORY_TRACT | Status: DC
  Administered 2016-10-29 – 2016-11-02 (×9): 2 via RESPIRATORY_TRACT
  Filled 2016-10-29: qty 8.8

## 2016-10-29 MED ORDER — ACETAMINOPHEN 650 MG RE SUPP: 650.0000 mg | Freq: Four times a day (QID) | RECTAL | Status: DC | PRN

## 2016-10-29 MED ORDER — FENTANYL CITRATE (PF) 100 MCG/2ML IJ SOLN
75.0000 ug | Freq: Once | INTRAMUSCULAR | Status: AC
  Administered 2016-10-29: 75 ug via INTRAVENOUS
  Filled 2016-10-29: qty 2

## 2016-10-29 MED ORDER — DEXTROSE 5 % IV SOLN
500.0000 mg | INTRAVENOUS | Status: DC
  Administered 2016-10-29 – 2016-10-31 (×3): 500 mg via INTRAVENOUS
  Filled 2016-10-29 (×5): qty 500

## 2016-10-29 MED ORDER — GI COCKTAIL ~~LOC~~
30.0000 mL | Freq: Once | ORAL | Status: DC
  Filled 2016-10-29: qty 30

## 2016-10-29 MED ORDER — ONDANSETRON HCL 4 MG PO TABS: 4.0000 mg | ORAL_TABLET | Freq: Four times a day (QID) | ORAL | Status: DC | PRN

## 2016-10-29 MED ORDER — LEVOTHYROXINE SODIUM 50 MCG PO TABS
50.0000 ug | ORAL_TABLET | Freq: Every day | ORAL | Status: DC
  Administered 2016-10-30 – 2016-11-02 (×4): 50 ug via ORAL
  Filled 2016-10-29 (×4): qty 1

## 2016-10-29 MED ORDER — PREDNISONE 20 MG PO TABS
40.0000 mg | ORAL_TABLET | Freq: Every day | ORAL | Status: DC
  Administered 2016-10-30 – 2016-11-02 (×4): 40 mg via ORAL
  Filled 2016-10-29 (×4): qty 2

## 2016-10-29 MED ORDER — ORAL CARE MOUTH RINSE
15.0000 mL | Freq: Two times a day (BID) | OROMUCOSAL | Status: DC
  Administered 2016-10-29 – 2016-11-02 (×7): 15 mL via OROMUCOSAL

## 2016-10-29 MED ORDER — GI COCKTAIL ~~LOC~~
30.0000 mL | Freq: Once | ORAL | Status: AC
  Administered 2016-10-29: 30 mL via ORAL

## 2016-10-29 MED ORDER — HYDROCODONE-ACETAMINOPHEN 5-325 MG PO TABS
1.0000 | ORAL_TABLET | Freq: Four times a day (QID) | ORAL | Status: DC | PRN
  Administered 2016-10-29 – 2016-11-01 (×3): 1 via ORAL
  Administered 2016-11-01 – 2016-11-02 (×2): 2 via ORAL
  Administered 2016-11-02: 1 via ORAL
  Filled 2016-10-29: qty 1
  Filled 2016-10-29: qty 2
  Filled 2016-10-29: qty 1
  Filled 2016-10-29: qty 2
  Filled 2016-10-29 (×2): qty 1

## 2016-10-29 MED ORDER — ACETAMINOPHEN 325 MG PO TABS: 650.0000 mg | ORAL_TABLET | Freq: Four times a day (QID) | ORAL | Status: DC | PRN

## 2016-10-29 MED ORDER — IOPAMIDOL (ISOVUE-300) INJECTION 61%
15.0000 mL | INTRAVENOUS | Status: AC
  Administered 2016-10-29 (×2): 15 mL via ORAL

## 2016-10-29 MED ORDER — SODIUM CHLORIDE 0.9 % IV BOLUS (SEPSIS)
1000.0000 mL | Freq: Once | INTRAVENOUS | Status: AC
  Administered 2016-10-29: 1000 mL via INTRAVENOUS

## 2016-10-29 MED ORDER — POTASSIUM CHLORIDE CRYS ER 20 MEQ PO TBCR
20.0000 meq | EXTENDED_RELEASE_TABLET | Freq: Every day | ORAL | Status: DC
  Administered 2016-10-29 – 2016-11-02 (×5): 20 meq via ORAL
  Filled 2016-10-29 (×5): qty 1

## 2016-10-29 MED ORDER — OXYBUTYNIN CHLORIDE 5 MG PO TABS: 5.0000 mg | ORAL_TABLET | Freq: Three times a day (TID) | ORAL | Status: DC | PRN

## 2016-10-29 MED ORDER — FENTANYL CITRATE (PF) 100 MCG/2ML IJ SOLN: 75.0000 ug | Freq: Once | INTRAMUSCULAR | Status: DC

## 2016-10-29 MED ORDER — NYSTATIN 100000 UNIT/ML MT SUSP
5.0000 mL | Freq: Four times a day (QID) | OROMUCOSAL | Status: DC
  Administered 2016-10-29 – 2016-11-02 (×17): 500000 [IU] via ORAL
  Filled 2016-10-29 (×17): qty 5

## 2016-10-29 MED ORDER — ALBUTEROL SULFATE (2.5 MG/3ML) 0.083% IN NEBU: 3.0000 mL | INHALATION_SOLUTION | Freq: Four times a day (QID) | RESPIRATORY_TRACT | Status: DC | PRN

## 2016-10-29 MED ORDER — VITAMIN D 1000 UNITS PO TABS
1000.0000 [IU] | ORAL_TABLET | Freq: Every day | ORAL | Status: DC
  Administered 2016-10-30 – 2016-11-02 (×4): 1000 [IU] via ORAL
  Filled 2016-10-29 (×4): qty 1

## 2016-10-29 MED ORDER — ONDANSETRON HCL 4 MG/2ML IJ SOLN
4.0000 mg | Freq: Four times a day (QID) | INTRAMUSCULAR | Status: DC | PRN
  Filled 2016-10-29: qty 2

## 2016-10-29 MED ORDER — FENTANYL CITRATE (PF) 100 MCG/2ML IJ SOLN
100.0000 ug | Freq: Once | INTRAMUSCULAR | Status: AC
  Administered 2016-10-29: 100 ug via INTRAVENOUS
  Filled 2016-10-29: qty 2

## 2016-10-29 MED ORDER — VANCOMYCIN HCL IN DEXTROSE 1-5 GM/200ML-% IV SOLN
1000.0000 mg | INTRAVENOUS | Status: DC
  Administered 2016-10-30 – 2016-10-31 (×2): 1000 mg via INTRAVENOUS
  Filled 2016-10-29 (×2): qty 200

## 2016-10-29 MED ORDER — SODIUM CHLORIDE 0.9 % IV BOLUS (SEPSIS)
250.0000 mL | Freq: Once | INTRAVENOUS | Status: AC
  Administered 2016-10-29: 250 mL via INTRAVENOUS

## 2016-10-29 NOTE — ED Notes (Addendum)
Patient c/o pain in the right upper back and shoulder blade. Patient states that pain started this morning around 2 am. Patient states that when the pain started he felt more short of breath than normal, was nauseated and had dry heaves. Patient denies dizziness or diaphoresis.  Patient also reports that he is having on going abdominal pain. Patient had stent placed in his ureter and the pain started about a week after that. Patient states that he is not urinating as much as he normally does but he has had decreased PO intake.  Patient is not in any distress at this time. Breathing is equal and unlabored.

## 2016-10-29 NOTE — ED Notes (Signed)
Patient taken off of O2 while talking to MD to see if he would be able to tolerate being on room air. Patients O2 sats decreased to 88% on room air after being off O2 for approximately 5 to 10 minutes. Patient placed back on 2 Liters of O2 with 02 sats improving to 96%.

## 2016-10-29 NOTE — ED Notes (Signed)
Patient transported to CT 

## 2016-10-29 NOTE — Progress Notes (Signed)
Pharmacy Antibiotic Note  Edwin Moran is a 75 y.o. male admitted on 10/29/2016 with  pneumonia/HCAP.  Pharmacy has been consulted for Aztreonam and Vancomycin dosing. Patient is also receiving Azithromycin.   Plan: Ke: 0.023   T1/2: 30   Vd: 53  Will start the patient on Vancomycin 1gm IV every 36 hours with 8 hours stack dosing. Plan for trough prior to 4th dose. Will monitor renal function and adjust dose as needed. Recommend D/C vanc if MRSA PCR negative.   Will start aztreonam 1gm IV every 12 hours.   Height: '5\' 4"'$  (162.6 cm) Weight: 166 lb 10.7 oz (75.6 kg) IBW/kg (Calculated) : 59.2  Temp (24hrs), Avg:96.3 F (35.7 C), Min:96.3 F (35.7 C), Max:96.3 F (35.7 C)   Recent Labs Lab 10/23/16 0458 10/24/16 0421 10/25/16 0453 10/26/16 0445 10/29/16 0832 10/29/16 1157 10/29/16 1514  WBC  --   --   --   --  10.8*  --   --   CREATININE 1.95* 1.96* 1.90* 2.16* 2.58*  --   --   LATICACIDVEN  --   --   --   --   --  2.6* 4.2*    Estimated Creatinine Clearance: 23.4 mL/min (by C-G formula based on SCr of 2.58 mg/dL (H)).    Allergies  Allergen Reactions  . Cefprozil     REACTION: Swelling  . Penicillins Swelling    Has patient had a PCN reaction causing immediate rash, facial/tongue/throat swelling, SOB or lightheadedness with hypotension: Yes Has patient had a PCN reaction causing severe rash involving mucus membranes or skin necrosis: No Has patient had a PCN reaction that required hospitalization No Has patient had a PCN reaction occurring within the last 10 years: No If all of the above answers are "NO", then may proceed with Cephalosporin use.   Marland Kitchen Potassium Chloride     REACTION: SOB    Antimicrobials this admission: 12/30 Aztreonam >>  12/30 Vancomcyin >>  12/30 azithromycin >>  Dose adjustments this admission:   Microbiology results: BCx:  UCx:   Sputum:    MRSA PCR:   Thank you for allowing pharmacy to be a part of this patient's care.  Pernell Dupre, PharmD, BCPS Clinical Pharmacist 10/29/2016 9:25 PM

## 2016-10-29 NOTE — ED Provider Notes (Addendum)
[facility] Emergency Department Provider Note ____________________________________________   I have reviewed the triage vital signs and the triage nursing note.  HISTORY  Chief Complaint Abdominal Pain   Historian Patient, and daughter and son in law  HPI Edwin Moran is a 75 y.o. male who lives with his daughter and on in law, recently admitted to the ER for hypoxia and found to have pleural effusion which reportedly was tapped and showed "cancer cells."   Per family he was weaned off of oxygen and discharged home on Wednesday. He states he was doing pretty well at home but really since yesterday he started getting some nausea and indigestion and burning in his epigastrium and also points further down to about his belly button. He is also now reporting right shoulder blade pain. The right side is where he had the pleurocentesis.  Pain is moderate. He's been having constipation. He denies fevers or chills. He denies shortness of breath, states that has been much improved since discharge from the hospital, in fact ever since he had the fluid drained off the lung.    Past Medical History:  Diagnosis Date  . Cancer ([place])    skin  . Complication of anesthesia 1963   pt woke up during the procedure choking on drainage.  Marland Kitchen COPD (chronic obstructive pulmonary disease) ([place])   . Diverticulosis of colon   . GERD (gastroesophageal reflux disease)   . History of GI diverticular bleed 03/20-03/31/04   [facility]  . History of hiatal hernia   . Hyperlipidemia   . Hypertension   . Osteoarthritis   . Peripheral vascular disease ([place])    right foot tunred purole  . PONV (postoperative nausea and vomiting)    at age 52 with finger surgery.  . Renal insufficiency   . Smoking     Patient Active Problem List   Diagnosis Date Noted  . Pleural effusion 10/20/2016  . COPD exacerbation ([place]) 10/04/2016  . Hydronephrosis, Left 09/27/2016  . Chronic kidney disease, stage 3  09/27/2016  . Hematuria 08/28/2016  . Pulmonary nodule 08/28/2016  . PAD (peripheral artery disease) ([place]) 06/14/2016  . Advance care planning 10/07/2014  . SOB (shortness of breath) on exertion 01/02/2014  . Medicare annual wellness visit, subsequent 03/21/2013  . ARTHRITIS, CARPOMETACARPAL JOINT, BILATERAL 05/28/2009  . DISORDER, TOBACCO USE 05/11/2007  . Essential hypertension 05/11/2007  . ANEMIA, VITAMIN B12 DEFICIENCY 05/10/2007  . DIVERTICULOSIS, COLON 05/10/2007  . OSTEOARTHRITIS 05/10/2007    Past Surgical History:  Procedure Laterality Date  . COLONOSCOPY    . CYSTOSCOPY  1991   due to prolonged prostatitis  . CYSTOSCOPY W/ RETROGRADES Bilateral 10/10/2016   Procedure: CYSTOSCOPY WITH RETROGRADE PYELOGRAM;  Surgeon: Hollice Espy, MD;  Location: [facility] ORS;  Service: Urology;  Laterality: Bilateral;  . CYSTOSCOPY WITH BIOPSY N/A 10/10/2016   Procedure: CYSTOSCOPY WITH BIOPSY;  Surgeon: Hollice Espy, MD;  Location: [facility] ORS;  Service: Urology;  Laterality: N/A;  . CYSTOSCOPY WITH STENT PLACEMENT Left 10/10/2016   Procedure: CYSTOSCOPY WITH STENT PLACEMENT;  Surgeon: Hollice Espy, MD;  Location: [facility] ORS;  Service: Urology;  Laterality: Left;  . 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 PERIPHERAL VASCULAR CATHETERIZATION Right 06/20/2016   Procedure: Lower Extremity Angiography;  Surgeon: Algernon Huxley, MD;  Location: [facility];  Service: Cardiovascular;  Laterality: Right;  . URETEROSCOPY Left 10/10/2016   Procedure: URETEROSCOPY;  Surgeon: Hollice Espy, MD;  Location: [facility]  ORS;  Service: Urology;  Laterality: Left;  . WISDOM TOOTH EXTRACTION      Prior to Admission medications   Medication Sig Start Date End Date Taking? Authorizing Provider  albuterol (PROVENTIL HFA;VENTOLIN HFA) 108 (90 Base) MCG/ACT inhaler Inhale 2 puffs into the lungs every 6 (six) hours as needed for wheezing or  shortness of breath. 10/26/16  Yes Richard Leslye Peer, MD  albuterol (PROVENTIL) (2.5 MG/3ML) 0.083% nebulizer solution Take 3 mLs (2.5 mg total) by nebulization 4 (four) times daily. 10/26/16  Yes Loletha Grayer, MD  aspirin EC 81 MG tablet Take 1 tablet (81 mg total) by mouth daily. 06/20/16  Yes Algernon Huxley, MD  atorvastatin (LIPITOR) 40 MG tablet Take 1 tablet (40 mg total) by mouth daily. 10/26/16  Yes Richard Leslye Peer, MD  budesonide-formoterol [facility]) 160-4.5 MCG/ACT inhaler Inhale 2 puffs into the lungs 2 (two) times daily. 11/11/15  Yes Tonia Ghent, MD  cholecalciferol (VITAMIN D) 1000 UNITS tablet Take 1,000 Units by mouth daily.   Yes Historical Provider, MD  clopidogrel (PLAVIX) 75 MG tablet Take 1 tablet (75 mg total) by mouth daily. 06/20/16  Yes Algernon Huxley, MD  docusate sodium (COLACE) 100 MG capsule Take 1 capsule (100 mg total) by mouth 2 (two) times daily. 10/10/16  Yes Hollice Espy, MD  furosemide (LASIX) 40 MG tablet Take 1 tablet (40 mg total) by mouth daily. 10/26/16  Yes Loletha Grayer, MD  HYDROcodone-acetaminophen (NORCO/VICODIN) 5-325 MG tablet Take 1-2 tablets by mouth every 6 (six) hours as needed for moderate pain. 10/10/16  Yes Hollice Espy, MD  levofloxacin (LEVAQUIN) 250 MG tablet Take 1 tablet (250 mg total) by mouth daily. 10/26/16  Yes Loletha Grayer, MD  levothyroxine (SYNTHROID, LEVOTHROID) 50 MCG tablet Take 1 tablet (50 mcg total) by mouth daily before breakfast. 10/26/16  Yes Loletha Grayer, MD  nystatin (MYCOSTATIN) 100000 UNIT/ML suspension Take 5 mLs (500,000 Units total) by mouth 4 (four) times daily. 10/26/16  Yes Loletha Grayer, MD  oxybutynin (DITROPAN) 5 MG tablet Take 1 tablet (5 mg total) by mouth every 8 (eight) hours as needed for bladder spasms. 10/10/16  Yes Hollice Espy, MD  potassium chloride SA (K-DUR,KLOR-CON) 20 MEQ tablet Take 1 tablet (20 mEq total) by mouth daily. 10/26/16  Yes Loletha Grayer, MD  predniSONE (DELTASONE) 5 MG  tablet 4 tabs po day1; 3 tabs po day2; 2 tabs po day3; 1 tab po day4,5 then stop 10/26/16  Yes Loletha Grayer, MD    Allergies  Allergen Reactions  . Cefprozil     REACTION: Swelling  . Penicillins Swelling    Has patient had a PCN reaction causing immediate rash, facial/tongue/throat swelling, SOB or lightheadedness with hypotension: Yes Has patient had a PCN reaction causing severe rash involving mucus membranes or skin necrosis: No Has patient had a PCN reaction that required hospitalization No Has patient had a PCN reaction occurring within the last 10 years: No If all of the above answers are "NO", then may proceed with Cephalosporin use.   Marland Kitchen Potassium Chloride     REACTION: SOB    Family History  Problem Relation Age of Onset  . Asthma Mother     chronic, asthma attack caused MI  . Heart disease Mother     MI  . Thrombophlebitis Mother   . Hiatal hernia Mother   . Prostate cancer Neg Hx   . Colon cancer Neg Hx     Social History Social History  Substance Use Topics  .  Smoking status: Former Smoker    Packs/day: 1.00    Years: 40.00    Quit date: 07/31/2016  . Smokeless tobacco: Never Used     Comment: off and on for 40 years, 1 PPD at first  . Alcohol use 12.6 - 16.8 oz/week    14 Standard drinks or equivalent, 7 - 14 Shots of liquor per week     Comment: couple of drinks a day    Review of Systems  Constitutional: Negative for fever. Eyes: Negative for visual changes. ENT: Negative for sore throat. Cardiovascular: Negative for chest pain. Respiratory: Some shortness of breath, but that is not bad compared to last week. Gastrointestinal: As per hpi Genitourinary: No complaints. Musculoskeletal: right back pain as per hpi Skin: Negative for rash. Neurological: Negative for headache. 10 point Review of Systems otherwise negative ____________________________________________   PHYSICAL EXAM:  VITAL SIGNS: ED Triage Vitals  Enc Vitals Group     BP  10/29/16 0825 (!) 72/37     Pulse Rate 10/29/16 0825 (!) 105     Resp 10/29/16 0825 (!) 36     Temp --      Temp src --      SpO2 10/29/16 0825 94 %     Weight 10/29/16 0828 160 lb (72.6 kg)     Height 10/29/16 0828 '5\' 4"'$  (1.626 m)     Head Circumference --      Peak Flow --      Pain Score 10/29/16 0828 6     Pain Loc --      Pain Edu? --      Excl. in [place]? --      Constitutional: Alert and oriented. Well appearing and in no distress. HEENT   Head: Normocephalic and atraumatic.      Eyes: Conjunctivae are normal. PERRL. Normal extraocular movements.      Ears:         Nose: No congestion/rhinnorhea.   Mouth/Throat: Mucous membranes are moist.   Neck: No stridor. Cardiovascular/Chest: Normal rate, regular rhythm.  No murmurs, rubs, or gallops. Respiratory: Normal respiratory effort without tachypnea nor retractions. Breath sounds are clear and equal bilaterally. No wheezes/rales/rhonchi. Gastrointestinal: Soft. No distention, no guarding, no rebound. Mild tenderness epigastric, mid abdomen, non focal, not ruq.    Genitourinary/rectal:Deferred Musculoskeletal: Nontender with normal range of motion in all extremities. No joint effusions.  No lower extremity tenderness.  No edema. Neurologic:  Normal speech and language. No gross or focal neurologic deficits are appreciated. Skin:  Skin is warm, dry and intact. No rash noted. Psychiatric: Mood and affect are normal. Speech and behavior are normal. Patient exhibits appropriate insight and judgment.   ____________________________________________  LABS (pertinent positives/negatives)  Labs Reviewed  COMPREHENSIVE METABOLIC PANEL - Abnormal; Notable for the following:       Result Value   Chloride 97 (*)    Glucose, Bld 144 (*)    BUN 54 (*)    Creatinine, Ser 2.58 (*)    Albumin 3.2 (*)    AST 65 (*)    ALT 66 (*)    GFR calc non Af Amer 23 (*)    GFR calc Af Amer 27 (*)    All other components within normal  limits  CBC - Abnormal; Notable for the following:    WBC 10.8 (*)    RBC 3.33 (*)    Hemoglobin 11.5 (*)    HCT 31.9 (*)    MCH 34.4 (*)  RDW 14.6 (*)    All other components within normal limits  URINALYSIS, COMPLETE (UACMP) WITH MICROSCOPIC - Abnormal; Notable for the following:    Color, Urine YELLOW (*)    APPearance CLEAR (*)    Hgb urine dipstick MODERATE (*)    Leukocytes, UA LARGE (*)    All other components within normal limits  TROPONIN I - Abnormal; Notable for the following:    Troponin I 0.03 (*)    All other components within normal limits  TSH - Abnormal; Notable for the following:    TSH 184.000 (*)    All other components within normal limits  LACTIC ACID, PLASMA - Abnormal; Notable for the following:    Lactic Acid, Venous 2.6 (*)    All other components within normal limits  LACTIC ACID, PLASMA - Abnormal; Notable for the following:    Lactic Acid, Venous 4.2 (*)    All other components within normal limits  CULTURE, BLOOD (ROUTINE X 2)  CULTURE, BLOOD (ROUTINE X 2)  TYPE AND SCREEN    ____________________________________________    EKG I, Lisa Roca, MD, the attending physician have personally viewed and interpreted all ECGs.  97 bpm. Normal sinus rhythm. Nonspecific intraventricular conduction delay. Rest deviation. Nonspecific ST and T-wave   __________________________________________  RADIOLOGY All Xrays were viewed by me. Imaging interpreted by Radiologist.   chest x-ray portable:  IMPRESSION: 1. Perihilar and right greater than left lower lung zone opacity with a small right pleural effusion. Findings may reflect asymmetric pulmonary edema. Consider multifocal pneumonia if there are consistent clinical symptoms. Overall appearance of the lungs is mildly improved when compared to most recent prior exam.  CT chest and abdomen and pelvis without contrast: IMPRESSION: 1. 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 There also may be a component of chronic heart failure and pneumonia. A right pleural effusion shows decrease in volume since 12/21. 2. New large subcapsular fluid collection with visible invagination into the liver parenchyma and mass effect on the liver consistent with spontaneous subcapsular hemorrhage. This is new since 12/21. 3. These results were called by telephone at the time of interpretation on 10/29/2016 at 3:36 pm to Dr. Lisa Roca , who verbally acknowledged these results. __________________________________________  PROCEDURES  Procedure(s) performed: None  Critical Care performed: CRITICAL CARE Performed by: Lisa Roca   Total critical care time: 30 minutes  Critical care time was exclusive of separately billable procedures and treating other patients.  Critical care was necessary to treat or prevent imminent or life-threatening deterioration.  Critical care was time spent personally by me on the following activities: development of treatment plan with patient and/or surrogate as well as nursing, discussions with consultants, evaluation of patient's response to treatment, examination of patient, obtaining history from patient or surrogate, ordering and performing treatments and interventions, ordering and review of laboratory studies, ordering and review of radiographic studies, pulse oximetry and re-evaluation of patient's condition.   ____________________________________________   ED COURSE / ASSESSMENT AND PLAN  Pertinent labs & imaging results that were available during my care of the patient were reviewed by me and considered in my medical decision making (see chart for details).   Mr. Hank is presenting today with epigastric burning which initially based on his description sounds like indigestion, but then he also points more down towards his mid abdomen and also up into his right posterior back.  His  initial triage blood pressure was low, but then  on his room his blood pressures were about 120s. I was called by the nurse when he was found to have a blood pressure in the 80s which was repeated. Normal saline bolus was started.  Patient had been started on a diuretic given recent "fluid on the lung" which is felt to be due to a new diagnosis of cancer cells off of the pleuracentesis sample.  Laboratory studies do show acute renal failure. I suspect the hypotension is probably related to dehydration given the recent diuretic use. He does not seem to be showing additional signs or symptoms concerning for sepsis although blood cultures were sent.  BP 120s after 1l ns.  CXR read as possible multifocal pneumonia or asymmetric pulm edema -- symptoms not consistent with pulmonary edema, I guess potentially possible for multifocal pneumonia, although denies fevers or chills. On laboratory studies he does have a slightly elevated white blood cell count, and while initially felt like this may have been due to hemoconcentration, his hemoglobin is just slightly less than previous.  Called back to the room as the patient's blood pressure did again drop into the 91B systolically, second liter normal saline was ordered. At this point time, the patient is still somewhat clouded, but with some concern about multifocal pneumonia and the recurrent hypotension with the elevated white blood cell count, I will cover him for hospital-acquired pneumonia covering for possible sepsis with vancomycin and zosyn.  I'm also going to CT scan his chest and abdomen although without contrast given the acute renal failure.  I placed patient on sepsis pathway, will receive 2250cc bolus given hypotension in the ER.  88% on room air, 94% on 2L ns.  I review blood pressure systolic 90, patient was placed for a third liter of normal saline.  Patient care transferred to Dr. Jacqualine Code at shift change 3 PM. CT scan chest abdomen pelvis are  pending, but I anticipate patient admission to hospitalist for sepsis/hypoxia/chest/abdominal discomfort.   CONSULTATIONS:  Hospitalist for admission.   Patient / Family / Caregiver informed of clinical course, medical decision-making process, and agree with plan.  Addended to include discussion from the radiologist -- subcapsular liver bleed.  Stable hg.  Aspirin and plavix recommended to hold given liver bleed.  Spoke face to face with Dr. Verdell Carmine, hospitalist - who spoke with Dr. Burt Knack, surgery -- no intervention now, does not recommend a need for transfer at this point.  Dr. Verdell Carmine to admit. ___________________________________________   FINAL CLINICAL IMPRESSION(S) / ED DIAGNOSES   Final diagnoses:  Acute renal failure, unspecified acute renal failure type ([place])  Back pain  Multifocal pneumonia  Hypoxia  Subcapsular hematoma of liver              Note: This dictation was prepared with Dragon dictation. Any transcriptional errors that result from this process are unintentional    Lisa Roca, MD 10/29/16 [place], MD 10/29/16 [phone]

## 2016-10-29 NOTE — ED Notes (Signed)
Pt states he feels unable to void. Bladder scan done - 571m present. Attending MD notified, order received to do in-and-out cath x1.

## 2016-10-29 NOTE — ED Notes (Signed)
Patient became acutely hypotensive at 86/67, RN in room and rechecked blood pressure, rechk blood pressure at 77/59. Patient states that he feels "a little dizzy". Dr. Reita Cliche notified and to bedside. NS 1000 ml bolus started.

## 2016-10-29 NOTE — ED Triage Notes (Signed)
Pt recently had a stent placed in his kidney then developed fluid in his lungs and was admitted for 6 days, pt has bruising to his extremities and reports that he is on blood thinners, pt also has bleeding to his lower lip and states that he bit his lip this am, but cont to ooze, pt states that his back hurts with pain on inspiration, pt appears to be short of breath, pt also states that he is having some abd pain, pt is hypotensive in triage and charge RN called

## 2016-10-29 NOTE — H&P (Signed)
Edwin Moran: Edwin Moran    MR#:  [id number]  DATE OF BIRTH:  11-Apr-1941  DATE OF ADMISSION:  10/29/2016  PRIMARY CARE PHYSICIAN: Elsie Stain, MD   REQUESTING/REFERRING PHYSICIAN: Dr. Lisa Roca  CHIEF COMPLAINT:   Chief Complaint  Patient presents with  . Abdominal Pain    HISTORY OF PRESENT ILLNESS:  Edwin Moran  is a 75 y.o. male with a known history of COPD, hypertension, hyperlipidemia, peripheral asked disease, osteoarthritis, recent diagnosis of suspected lung cancer, who was just recently discharged from the hospital returns back due to abdominal pain and also pain being referred to the right shoulder and feeling short of breath. Patient says that he has been more short of breath even at rest since he left the hospital 3 days ago. His abdominal pain was fairly severe and it kept him up all night he denied any nausea vomiting, diarrhea, fever, chills or any other associated symptoms. His daughter brought him to the emergency room and he was noted to be mildly hypotensive and chest x-ray findings are suggestive of multifocal pneumonia. He was also complaining of abdominal pain and therefore underwent a CT of the abdomen and pelvis which showed a subcapsular liver hemorrhage. Hospitalist services were contacted further treatment and evaluation.  PAST MEDICAL HISTORY:   Past Medical History:  Diagnosis Date  . Cancer ([place])    skin  . Complication of anesthesia 1963   pt woke up during the procedure choking on drainage.  Marland Kitchen COPD (chronic obstructive pulmonary disease) ([place])   . Diverticulosis of colon   . GERD (gastroesophageal reflux disease)   . History of GI diverticular bleed 03/20-03/31/04   [facility]  . History of hiatal hernia   . Hyperlipidemia   . Hypertension   . Osteoarthritis   . Peripheral vascular disease ([place])    right foot tunred purole  . PONV (postoperative nausea and vomiting)    at age 74 with  finger surgery.  . Renal insufficiency   . Smoking     PAST SURGICAL HISTORY:   Past Surgical History:  Procedure Laterality Date  . COLONOSCOPY    . CYSTOSCOPY  1991   due to prolonged prostatitis  . CYSTOSCOPY W/ RETROGRADES Bilateral 10/10/2016   Procedure: CYSTOSCOPY WITH RETROGRADE PYELOGRAM;  Surgeon: Hollice Espy, MD;  Location: [facility];  Service: Urology;  Laterality: Bilateral;  . CYSTOSCOPY WITH BIOPSY N/A 10/10/2016   Procedure: CYSTOSCOPY WITH BIOPSY;  Surgeon: Hollice Espy, MD;  Location: [facility];  Service: Urology;  Laterality: N/A;  . CYSTOSCOPY WITH STENT PLACEMENT Left 10/10/2016   Procedure: CYSTOSCOPY WITH STENT PLACEMENT;  Surgeon: Hollice Espy, MD;  Location: [facility];  Service: Urology;  Laterality: Left;  . 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 PERIPHERAL VASCULAR CATHETERIZATION Right 06/20/2016   Procedure: Lower Extremity Angiography;  Surgeon: Algernon Huxley, MD;  Location: [facility];  Service: Cardiovascular;  Laterality: Right;  . URETEROSCOPY Left 10/10/2016   Procedure: URETEROSCOPY;  Surgeon: Hollice Espy, MD;  Location: [facility];  Service: Urology;  Laterality: Left;  . WISDOM TOOTH EXTRACTION      SOCIAL HISTORY:   Social History  Substance Use Topics  . Smoking status: Former Smoker    Packs/day: 1.00    Years: 40.00    Quit date: 07/31/2016  . Smokeless tobacco: Never Used     Comment: off and on for  40 years, 1 PPD at first  . Alcohol use 12.6 - 16.8 oz/week    14 Standard drinks or equivalent, 7 - 14 Shots of liquor per week     Comment: couple of drinks a day    FAMILY HISTORY:   Family History  Problem Relation Age of Onset  . Asthma Mother     chronic, asthma attack caused MI  . Heart disease Mother     MI  . Thrombophlebitis Mother   . Hiatal hernia Mother   . Prostate cancer Neg Hx   . Colon cancer Neg Hx     DRUG ALLERGIES:    Allergies  Allergen Reactions  . Cefprozil     REACTION: Swelling  . Penicillins Swelling    Has patient had a PCN reaction causing immediate rash, facial/tongue/throat swelling, SOB or lightheadedness with hypotension: Yes Has patient had a PCN reaction causing severe rash involving mucus membranes or skin necrosis: No Has patient had a PCN reaction that required hospitalization No Has patient had a PCN reaction occurring within the last 10 years: No If all of the above answers are "NO", then may proceed with Cephalosporin use.   Marland Kitchen Potassium Chloride     REACTION: SOB    REVIEW OF SYSTEMS:   Review of Systems  Constitutional: Negative for fever and weight loss.  HENT: Negative for congestion, nosebleeds and tinnitus.   Eyes: Negative for blurred vision, double vision and redness.  Respiratory: Positive for shortness of breath. Negative for cough and hemoptysis.   Cardiovascular: Negative for chest pain, orthopnea, leg swelling and PND.  Gastrointestinal: Positive for abdominal pain. Negative for diarrhea, melena, nausea and vomiting.  Genitourinary: Negative for dysuria, hematuria and urgency.  Musculoskeletal: Negative for falls and joint pain.  Neurological: Positive for weakness. Negative for dizziness, tingling, sensory change, focal weakness, seizures and headaches.  Endo/Heme/Allergies: Negative for polydipsia. Does not bruise/bleed easily.  Psychiatric/Behavioral: Negative for depression and memory loss. The patient is not nervous/anxious.     MEDICATIONS AT HOME:   Prior to Admission medications   Medication Sig Start Date End Date Taking? Authorizing Provider  albuterol (PROVENTIL HFA;VENTOLIN HFA) 108 (90 Base) MCG/ACT inhaler Inhale 2 puffs into the lungs every 6 (six) hours as needed for wheezing or shortness of breath. 10/26/16  Yes Richard Leslye Peer, MD  albuterol (PROVENTIL) (2.5 MG/3ML) 0.083% nebulizer solution Take 3 mLs (2.5 mg total) by nebulization 4 (four)  times daily. 10/26/16  Yes Loletha Grayer, MD  aspirin EC 81 MG tablet Take 1 tablet (81 mg total) by mouth daily. 06/20/16  Yes Algernon Huxley, MD  atorvastatin (LIPITOR) 40 MG tablet Take 1 tablet (40 mg total) by mouth daily. 10/26/16  Yes Richard Leslye Peer, MD  budesonide-formoterol [facility]) 160-4.5 MCG/ACT inhaler Inhale 2 puffs into the lungs 2 (two) times daily. 11/11/15  Yes Tonia Ghent, MD  cholecalciferol (VITAMIN D) 1000 UNITS tablet Take 1,000 Units by mouth daily.   Yes Historical Provider, MD  clopidogrel (PLAVIX) 75 MG tablet Take 1 tablet (75 mg total) by mouth daily. 06/20/16  Yes Algernon Huxley, MD  docusate sodium (COLACE) 100 MG capsule Take 1 capsule (100 mg total) by mouth 2 (two) times daily. 10/10/16  Yes Hollice Espy, MD  furosemide (LASIX) 40 MG tablet Take 1 tablet (40 mg total) by mouth daily. 10/26/16  Yes Loletha Grayer, MD  HYDROcodone-acetaminophen (NORCO/VICODIN) 5-325 MG tablet Take 1-2 tablets by mouth every 6 (six) hours as needed for moderate pain. 10/10/16  Yes Hollice Espy, MD  levofloxacin (LEVAQUIN) 250 MG tablet Take 1 tablet (250 mg total) by mouth daily. 10/26/16  Yes Loletha Grayer, MD  levothyroxine (SYNTHROID, LEVOTHROID) 50 MCG tablet Take 1 tablet (50 mcg total) by mouth daily before breakfast. 10/26/16  Yes Loletha Grayer, MD  nystatin (MYCOSTATIN) 100000 UNIT/ML suspension Take 5 mLs (500,000 Units total) by mouth 4 (four) times daily. 10/26/16  Yes Loletha Grayer, MD  oxybutynin (DITROPAN) 5 MG tablet Take 1 tablet (5 mg total) by mouth every 8 (eight) hours as needed for bladder spasms. 10/10/16  Yes Hollice Espy, MD  potassium chloride SA (K-DUR,KLOR-CON) 20 MEQ tablet Take 1 tablet (20 mEq total) by mouth daily. 10/26/16  Yes Loletha Grayer, MD  predniSONE (DELTASONE) 5 MG tablet 4 tabs po day1; 3 tabs po day2; 2 tabs po day3; 1 tab po day4,5 then stop 10/26/16  Yes Loletha Grayer, MD      VITAL SIGNS:  Blood pressure 129/76, pulse  82, resp. rate 20, height '5\' 4"'$  (1.626 m), weight 72.6 kg (160 lb), SpO2 91 %.  PHYSICAL EXAMINATION:  Physical Exam  GENERAL:  75 y.o.-year-old patient lying in the bed in mild distress.  EYES: Pupils equal, round, reactive to light and accommodation. No scleral icterus. Extraocular muscles intact.  HEENT: Head atraumatic, normocephalic. Oropharynx and nasopharynx clear. No oropharyngeal erythema, moist oral mucosa  NECK:  Supple, no jugular venous distention. No thyroid enlargement, no tenderness.  LUNGS: Normal breath sounds bilaterally, no wheezing, rales, rhonchi. No use of accessory muscles of respiration.  CARDIOVASCULAR: S1, S2 RRR. No murmurs, rubs, gallops, clicks.  ABDOMEN: Soft, Tender in RUQ, no rebound rigidity, slightly distended. Bowel sounds present. No organomegaly or mass.  EXTREMITIES: No pedal edema, cyanosis, or clubbing. + 2 pedal & radial pulses b/l.   NEUROLOGIC: Cranial nerves II through XII are intact. No focal Motor or sensory deficits appreciated b/l PSYCHIATRIC: The patient is alert and oriented x 3. Good affect.  SKIN: No obvious rash, lesion, or ulcer.   LABORATORY PANEL:   CBC  Recent Labs Lab 10/29/16 0832  WBC 10.8*  HGB 11.5*  HCT 31.9*  PLT 348   ------------------------------------------------------------------------------------------------------------------  Chemistries   Recent Labs Lab 10/29/16 0832  NA 135  K 3.8  CL 97*  CO2 24  GLUCOSE 144*  BUN 54*  CREATININE 2.58*  CALCIUM 9.7  AST 65*  ALT 66*  ALKPHOS 80  BILITOT 0.8   ------------------------------------------------------------------------------------------------------------------  Cardiac Enzymes  Recent Labs Lab 10/29/16 0832  TROPONINI 0.03*   ------------------------------------------------------------------------------------------------------------------  RADIOLOGY:  Ct Abdomen Pelvis Wo Contrast  Result Date: 10/29/2016 CLINICAL DATA:  Back pain,  pleuritic chest pain, shortness of breath, abdominal pain and hypotension. Status post recent left ureteral stent placement for hydronephrosis due to distal left ureteral obstruction. EXAM: CT CHEST, ABDOMEN AND PELVIS WITHOUT CONTRAST TECHNIQUE: Multidetector CT imaging of the chest, abdomen and pelvis was performed following the standard protocol without IV contrast. COMPARISON:  CTA of the chest on 10/20/2016 CT of the abdomen and pelvis on 09/26/2016 FINDINGS: CT CHEST FINDINGS Cardiovascular: The heart size is normal. Coronary atherosclerosis is present with diffusely calcified plaque in a 3 vessel distribution. Trace pericardial fluid. Mediastinum/Nodes: Stable mildly prominent AP window and prevascular mediastinal lymph nodes. Stable mildly prominent right axillary lymph nodes. Lungs/Pleura: Combination of multifocal nodules and areas of parenchymal airspace opacity are again noted in both lungs. Some of these findings likely represent some continued component of heart failure with persistent, but smaller, right  pleural effusion present. There are some concerning areas of nodularity including a 1.5 x 2.3 cm central area of spiculated nodularity in the left upper lobe adjacent to the aortic arch, 5 mm left upper lobe nodule, 7 mm lingular nodule, 1.3 cm left lower lobe nodule abutting the major fissure and persistent 1 cm right lower lobe nodule also abutting the major fissure. Patchy areas of ground-glass and airspace opacity are present in both upper and lower lung zones and a more geographic area of airspace disease and bronchial thickening is present through much of the lower aspect of the right lower lobe. Musculoskeletal: No bony abnormalities in the chest. CT ABDOMEN PELVIS FINDINGS Hepatobiliary: Dominant new finding since recent 12/21 CT is a new, large subcapsular fluid collection along the anterior and superior margin of the right lobe of the liver measuring up to approximately 14 x 5 x 9 cm and  associated with visible anterior capsular disruption and exerting mass effect on the liver. Internal fluid density is roughly 20 Hounsfield units and acute onset and appearance are consistent with spontaneous subcapsular hemorrhage. This does appear to be largely confined within the capsule as no significant intraperitoneal hemorrhage is identified. The gallbladder is unremarkable. No biliary dilatation. Pancreas: Unenhanced appearance of the pancreas is stable and unremarkable. Spleen: The spleen remains normal in size. There is a small amount of free fluid around the spleen. Adrenals/Urinary Tract: Adrenal gland shows stable hyperplastic appearance bilaterally. Left ureteral stent extends from an upper pole infundibulum down to the level of the bladder. No significant left hydronephrosis. The right kidney shows nonobstructing calculi. Stomach/Bowel: No evidence of bowel obstruction, free air or bowel inflammation. Stable diverticulosis of the colon. Vascular/Lymphatic: No enlarged lymph nodes are identified in the abdomen or pelvis. Stable atherosclerosis of the abdominal aorta and iliac arteries without evidence of aneurysm. Other: No abdominal wall hernias. 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 IMPRESSION: 1. 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 There also may be a component of chronic heart failure and pneumonia. A right pleural effusion shows decrease in volume since 12/21. 2. New large subcapsular fluid collection with visible invagination into the liver parenchyma and mass effect on the liver consistent with spontaneous subcapsular hemorrhage. This is new since 12/21. 3. These results were called by telephone at the time of interpretation on 10/29/2016 at 3:36 pm to Dr. Lisa Roca , who verbally acknowledged these results. Electronically Signed   By: Aletta Edouard M.D.   On: 10/29/2016 15:37   Ct Chest  Wo Contrast  Result Date: 10/29/2016 CLINICAL DATA:  Back pain, pleuritic chest pain, shortness of breath, abdominal pain and hypotension. Status post recent left ureteral stent placement for hydronephrosis due to distal left ureteral obstruction. EXAM: CT CHEST, ABDOMEN AND PELVIS WITHOUT CONTRAST TECHNIQUE: Multidetector CT imaging of the chest, abdomen and pelvis was performed following the standard protocol without IV contrast. COMPARISON:  CTA of the chest on 10/20/2016 CT of the abdomen and pelvis on 09/26/2016 FINDINGS: CT CHEST FINDINGS Cardiovascular: The heart size is normal. Coronary atherosclerosis is present with diffusely calcified plaque in a 3 vessel distribution. Trace pericardial fluid. Mediastinum/Nodes: Stable mildly prominent AP window and prevascular mediastinal lymph nodes. Stable mildly prominent right axillary lymph nodes. Lungs/Pleura: Combination of multifocal nodules and areas of parenchymal airspace opacity are again noted in both lungs. Some of these findings likely represent some continued component of heart failure with persistent, but smaller, right pleural  effusion present. There are some concerning areas of nodularity including a 1.5 x 2.3 cm central area of spiculated nodularity in the left upper lobe adjacent to the aortic arch, 5 mm left upper lobe nodule, 7 mm lingular nodule, 1.3 cm left lower lobe nodule abutting the major fissure and persistent 1 cm right lower lobe nodule also abutting the major fissure. Patchy areas of ground-glass and airspace opacity are present in both upper and lower lung zones and a more geographic area of airspace disease and bronchial thickening is present through much of the lower aspect of the right lower lobe. Musculoskeletal: No bony abnormalities in the chest. CT ABDOMEN PELVIS FINDINGS Hepatobiliary: Dominant new finding since recent 12/21 CT is a new, large subcapsular fluid collection along the anterior and superior margin of the right  lobe of the liver measuring up to approximately 14 x 5 x 9 cm and associated with visible anterior capsular disruption and exerting mass effect on the liver. Internal fluid density is roughly 20 Hounsfield units and acute onset and appearance are consistent with spontaneous subcapsular hemorrhage. This does appear to be largely confined within the capsule as no significant intraperitoneal hemorrhage is identified. The gallbladder is unremarkable. No biliary dilatation. Pancreas: Unenhanced appearance of the pancreas is stable and unremarkable. Spleen: The spleen remains normal in size. There is a small amount of free fluid around the spleen. Adrenals/Urinary Tract: Adrenal gland shows stable hyperplastic appearance bilaterally. Left ureteral stent extends from an upper pole infundibulum down to the level of the bladder. No significant left hydronephrosis. The right kidney shows nonobstructing calculi. Stomach/Bowel: No evidence of bowel obstruction, free air or bowel inflammation. Stable diverticulosis of the colon. Vascular/Lymphatic: No enlarged lymph nodes are identified in the abdomen or pelvis. Stable atherosclerosis of the abdominal aorta and iliac arteries without evidence of aneurysm. Other: No abdominal wall hernias. 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 IMPRESSION: 1. 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 There also may be a component of chronic heart failure and pneumonia. A right pleural effusion shows decrease in volume since 12/21. 2. New large subcapsular fluid collection with visible invagination into the liver parenchyma and mass effect on the liver consistent with spontaneous subcapsular hemorrhage. This is new since 12/21. 3. These results were called by telephone at the time of interpretation on 10/29/2016 at 3:36 pm to Dr. Lisa Roca , who verbally acknowledged these results. Electronically  Signed   By: Aletta Edouard M.D.   On: 10/29/2016 15:37   Dg Chest Port 1 View  Result Date: 10/29/2016 CLINICAL DATA:  Recently discharged for hospital for fluid on lungs. Thoracentesis done 10-20-16. Now with increasing SOB. EXAM: PORTABLE CHEST 1 VIEW COMPARISON:  10/24/2016 FINDINGS: Cardiac silhouette is normal in size. No convincing mediastinal masses. Mild hazy perihilar and lower lung zone interstitial airspace opacities are noted, most evident at the right base. Small right pleural effusion. These findings appear mildly improved from the most recent prior exam. Upper lungs are clear. No pneumothorax. Skeletal structures are grossly intact. IMPRESSION: 1. Perihilar and right greater than left lower lung zone opacity with a small right pleural effusion. Findings may reflect asymmetric pulmonary edema. Consider multifocal pneumonia if there are consistent clinical symptoms. Overall appearance of the lungs is mildly improved when compared to most recent prior exam. Electronically Signed   By: Lajean Manes M.D.   On: 10/29/2016 10:31     IMPRESSION AND PLAN:  75 year old male with past medical history of COPD, hypertension, chronic renal failure, GERD, previous history of skin cancer, previous history of diverticular bleed, recent diagnosis of suspected lung cancer who presents to the hospital with abdominal pain and also noted to be hypotensive and short of breath.   1. Abdominal pain-this is secondary to the patient's subcapsular liver edge noted on the CT scan of the abdomen and pelvis.  -Continue supportive care with pain control. Hold aspirin, Plavix. Follow serial hemoglobin. I will get a surgical consult.  2. Pneumonia-patient was noted to have multifocal pneumonia on chest x-ray. He has worsening shortness of breath since being discharged 3 days ago. -I will treat this as HCAP. I will start IV vancomycin, Cefepime.   - follow clinically, cont. O2 support.   3. 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 Failure w/ Hypoxia - due to Pneumonia.  - cont. tx as mentioned above.  Cont. O2 support.  - assess for Home O2 prior to discharge.  4. Hx of COPD - no acute exacerbation.  - cont. Albuterol nebs, dulera.   5. Hypothyroidism - cont. Synthroid.   6. Urinary Incontinence - cont. Oxybutynin.   7. Hx of PVD - hold ASa, Plavix due to # 1.   8. Acute on chronic renal failure-secondary to hypotension. -Continue gentle IV fluids, follow BUN and creatinine. Renal dose meds avoid nephrotoxins. Hold Lasix for now.    All the records are reviewed and case discussed with ED provider. Management plans discussed with the patient, family and they are in agreement.  CODE STATUS: Full  TOTAL TIME TAKING CARE OF THIS PATIENT: 45 minutes.    Henreitta Leber M.D on 10/29/2016 at 4:29 PM  Between 7am to 6pm - Pager - [phone]  After 6pm go to [email or web address] - password [email or web address] [email or web address]  Office  [phone]  CC: Primary care physician; Elsie Stain, MD

## 2016-10-29 NOTE — Consult Note (Signed)
Surgical Consultation  10/29/2016  Edwin Moran is an 74 y.o. male.   CC: Back and abdominal pain  HPI: This patient with the acute onset of back and abdominal pain that started yesterday afternoon. He was seen in the ED where he was hypotensive but responded to fluids promptly. A workup showed a subcapsular hematoma of the liver.  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 He is anticoagulated relatively with Plavix and aspirin.  Past Medical History:  Diagnosis Date  . Cancer ([place])    skin  . Complication of anesthesia 1963   pt woke up during the procedure choking on drainage.  Marland Kitchen COPD (chronic obstructive pulmonary disease) ([place])   . Diverticulosis of colon   . GERD (gastroesophageal reflux disease)   . History of GI diverticular bleed 03/20-03/31/04   [facility]  . History of hiatal hernia   . Hyperlipidemia   . Hypertension   . Osteoarthritis   . Peripheral vascular disease ([place])    right foot tunred purole  . PONV (postoperative nausea and vomiting)    at age 89 with finger surgery.  . Renal insufficiency   . Smoking     Past Surgical History:  Procedure Laterality Date  . COLONOSCOPY    . CYSTOSCOPY  1991   due to prolonged prostatitis  . CYSTOSCOPY W/ RETROGRADES Bilateral 10/10/2016   Procedure: CYSTOSCOPY WITH RETROGRADE PYELOGRAM;  Surgeon: Hollice Espy, MD;  Location: [facility] OR[facility];  Service: Urology;  Laterality: Bilateral;  . CYSTOSCOPY WITH BIOPSY N/A 10/10/2016   Procedure: CYSTOSCOPY WITH BIOPSY;  Surgeon: Hollice Espy, MD;  Location: [facility];  Service: Urology;  Laterality: N/A;  . CYSTOSCOPY WITH STENT PLACEMENT Left 10/10/2016   Procedure: CYSTOSCOPY WITH STENT PLACEMENT;  Surgeon: Hollice Espy, MD;  Location: [facility];  Service: Urology;  Laterality: Left;  . FINGER AMPUTATION  1961   Traumatically amputated left index/middle fingers-reattached ring finger  amputated  . PERIPHERAL VASCULAR CATHETERIZATION Right 06/20/2016   Procedure: Lower Extremity Angiography;  Surgeon: Algernon Huxley, MD;  Location: [place] [facility];  Service: Cardiovascular;  Laterality: Right;  . URETEROSCOPY Left 10/10/2016   Procedure: URETEROSCOPY;  Surgeon: Hollice Espy, MD;  Location: [facility];  Service: Urology;  Laterality: Left;  . WISDOM TOOTH EXTRACTION      Family History  Problem Relation Age of Onset  . Asthma Mother     chronic, asthma attack caused MI  . Heart disease Mother     MI  . Thrombophlebitis Mother   . Hiatal hernia Mother   . Prostate cancer Neg Hx   . Colon cancer Neg Hx     Social History:  reports that he quit smoking about 2 months ago. He has a 40.00 pack-year smoking history. He has never used smokeless tobacco. He reports that he drinks about 12.6 - 16.8 oz of alcohol per week . He reports that he does not use drugs.  Allergies:  Allergies  Allergen Reactions  . Cefprozil     REACTION: Swelling  . Penicillins Swelling    Has patient had a PCN reaction causing immediate rash, facial/tongue/throat swelling, SOB or lightheadedness with hypotension: Yes Has patient had a PCN reaction causing severe rash involving mucus membranes or skin necrosis: No Has patient had a PCN reaction that required hospitalization No Has patient had a PCN reaction occurring within the last 10 years: No If all of the  above answers are "NO", then may proceed with Cephalosporin use.   Marland Kitchen Potassium Chloride     REACTION: SOB    Medications reviewed.   Review of Systems:   Review of Systems  Constitutional: Negative for chills and fever.  HENT: Negative.   Eyes: Negative.   Respiratory: Positive for cough and shortness of breath.   Cardiovascular: Negative.   Gastrointestinal: Positive for abdominal pain. Negative for blood in stool, constipation, diarrhea, heartburn, nausea and vomiting.  Genitourinary: Negative.   Musculoskeletal: Negative.    Skin: Negative.   Neurological: Positive for weakness.  Endo/Heme/Allergies: Negative.   Psychiatric/Behavioral: Negative.      Physical Exam:  BP 104/65   Pulse 93   Resp (!) 26   Ht _0  (1.626 m)   Wt 160 lb (72.6 kg)   SpO2 93%   BMI 27.46 kg/m   Physical Exam  Constitutional: He is oriented to person, place, and time and well-developed, well-nourished, and in no distress.  Very pale appearing elderly male patient no acute distress comfortable-appearing  HENT:  Head: Normocephalic and atraumatic.  Eyes: Pupils are equal, round, and reactive to light. Right eye exhibits no discharge. Left eye exhibits no discharge. No scleral icterus.  Abdominal: Soft. He exhibits distension. There is tenderness. There is no rebound and no guarding.  Minimal tenderness in the epigastrium and right upper quadrant. Abdomen is distended and slightly tympanitic. No rebound no guarding no percussion tenderness  Musculoskeletal: Normal range of motion. He exhibits edema.  Neurological: He is alert and oriented to person, place, and time.  Skin: Skin is warm and dry.  Vitals reviewed.     Results for orders placed or performed during the hospital encounter of 10/29/16 (from the past 48 hour(s))  Comprehensive metabolic panel     Status: Abnormal   Collection Time: 10/29/16  8:32 AM  Result Value Ref Range   Sodium 135 135 - 145 mmol/L   Potassium 3.8 3.5 - 5.1 mmol/L   Chloride 97 (L) 101 - 111 mmol/L   CO2 24 22 - 32 mmol/L   Glucose, Bld 144 (H) 65 - 99 mg/dL   BUN 54 (H) 6 - 20 mg/dL   Creatinine, Ser 2.58 (H) 0.61 - 1.24 mg/dL   Calcium 9.7 8.9 - 10.3 mg/dL   Total Protein 6.5 6.5 - 8.1 g/dL   Albumin 3.2 (L) 3.5 - 5.0 g/dL   AST 65 (H) 15 - 41 U/L   ALT 66 (H) 17 - 63 U/L   Alkaline Phosphatase 80 38 - 126 U/L   Total Bilirubin 0.8 0.3 - 1.2 mg/dL   GFR calc non Af Amer 23 (L) >60 mL/min   GFR calc Af Amer 27 (L) >60 mL/min    Comment: (NOTE) The eGFR has been calculated  using the CKD EPI equation. This calculation has not been validated in all clinical situations. eGFR's persistently <60 mL/min signify possible Chronic Kidney Disease.    Anion gap 14 5 - 15  CBC     Status: Abnormal   Collection Time: 10/29/16  8:32 AM  Result Value Ref Range   WBC 10.8 (H) 3.8 - 10.6 K/uL   RBC 3.33 (L) 4.40 - 5.90 MIL/uL   Hemoglobin 11.5 (L) 13.0 - 18.0 g/dL   HCT 31.9 (L) 40.0 - 52.0 %   MCV 95.8 80.0 - 100.0 fL   MCH 34.4 (H) 26.0 - 34.0 pg   MCHC 35.9 32.0 - 36.0 g/dL   RDW 14.6 (  H) 11.5 - 14.5 %   Platelets 348 150 - 440 K/uL  Troponin I     Status: Abnormal   Collection Time: 10/29/16  8:32 AM  Result Value Ref Range   Troponin I 0.03 (HH) <0.03 ng/mL    Comment: CRITICAL RESULT CALLED TO, READ BACK BY AND VERIFIED WITH ANGELA ROBBINS @ 1050 10/29/16 BY [facility]   TSH     Status: Abnormal   Collection Time: 10/29/16  8:32 AM  Result Value Ref Range   TSH 184.000 (H) 0.350 - 4.500 uIU/mL    Comment: Performed by a 3rd Generation assay with a functional sensitivity of <=0.01 uIU/mL.  Lactic acid, plasma     Status: Abnormal   Collection Time: 10/29/16 11:57 AM  Result Value Ref Range   Lactic Acid, Venous 2.6 (HH) 0.5 - 1.9 mmol/L    Comment: CRITICAL RESULT CALLED TO, READ BACK BY AND VERIFIED WITH ALICIA GRANGER @ 1251 10/29/16 BY [facility]   Urinalysis, Complete w Microscopic     Status: Abnormal   Collection Time: 10/29/16  1:48 PM  Result Value Ref Range   Color, Urine YELLOW (A) YELLOW   APPearance CLEAR (A) CLEAR   Specific Gravity, Urine 1.013 1.005 - 1.030   pH 5.0 5.0 - 8.0   Glucose, UA NEGATIVE NEGATIVE mg/dL   Hgb urine dipstick MODERATE (A) NEGATIVE   Bilirubin Urine NEGATIVE NEGATIVE   Ketones, ur NEGATIVE NEGATIVE mg/dL   Protein, ur NEGATIVE NEGATIVE mg/dL   Nitrite NEGATIVE NEGATIVE   Leukocytes, UA LARGE (A) NEGATIVE   RBC / HPF 0-5 0 - 5 RBC/hpf   WBC, UA TOO NUMEROUS TO COUNT 0 - 5 WBC/hpf   Bacteria, UA NONE SEEN NONE SEEN    Squamous Epithelial / LPF NONE SEEN NONE SEEN   Mucous PRESENT   Lactic acid, plasma     Status: Abnormal   Collection Time: 10/29/16  3:14 PM  Result Value Ref Range   Lactic Acid, Venous 4.2 (HH) 0.5 - 1.9 mmol/L    Comment: CRITICAL RESULT CALLED TO, READ BACK BY AND VERIFIED WITH ALICIA GRANGER @ 1551 10/29/16 BY [facility]   Type and screen [facility]     Status: None (Preliminary result)   Collection Time: 10/29/16  4:29 PM  Result Value Ref Range   ABO/RH(D) PENDING    Antibody Screen PENDING    Sample Expiration 11/01/2016    Ct Abdomen Pelvis Wo Contrast  Result Date: 10/29/2016 CLINICAL DATA:  Back pain, pleuritic chest pain, shortness of breath, abdominal pain and hypotension. Status post recent left ureteral stent placement for hydronephrosis due to distal left ureteral obstruction. EXAM: CT CHEST, ABDOMEN AND PELVIS WITHOUT CONTRAST TECHNIQUE: Multidetector CT imaging of the chest, abdomen and pelvis was performed following the standard protocol without IV contrast. COMPARISON:  CTA of the chest on 10/20/2016 CT of the abdomen and pelvis on 09/26/2016 FINDINGS: CT CHEST FINDINGS Cardiovascular: The heart size is normal. Coronary atherosclerosis is present with diffusely calcified plaque in a 3 vessel distribution. Trace pericardial fluid. Mediastinum/Nodes: Stable mildly prominent AP window and prevascular mediastinal lymph nodes. Stable mildly prominent right axillary lymph nodes. Lungs/Pleura: Combination of multifocal nodules and areas of parenchymal airspace opacity are again noted in both lungs. Some of these findings likely represent some continued component of heart failure with persistent, but smaller, right pleural effusion present. There are some concerning areas of nodularity including a 1.5 x 2.3 cm central area of spiculated nodularity in the  left upper lobe adjacent to the aortic arch, 5 mm left upper lobe nodule, 7 mm lingular nodule, 1.3 cm left lower  lobe nodule abutting the major fissure and persistent 1 cm right lower lobe nodule also abutting the major fissure. Patchy areas of ground-glass and airspace opacity are present in both upper and lower lung zones and a more geographic area of airspace disease and bronchial thickening is present through much of the lower aspect of the right lower lobe. Musculoskeletal: No bony abnormalities in the chest. CT ABDOMEN PELVIS FINDINGS Hepatobiliary: Dominant new finding since recent 12/21 CT is a new, large subcapsular fluid collection along the anterior and superior margin of the right lobe of the liver measuring up to approximately 14 x 5 x 9 cm and associated with visible anterior capsular disruption and exerting mass effect on the liver. Internal fluid density is roughly 20 Hounsfield units and acute onset and appearance are consistent with spontaneous subcapsular hemorrhage. This does appear to be largely confined within the capsule as no significant intraperitoneal hemorrhage is identified. The gallbladder is unremarkable. No biliary dilatation. Pancreas: Unenhanced appearance of the pancreas is stable and unremarkable. Spleen: The spleen remains normal in size. There is a small amount of free fluid around the spleen. Adrenals/Urinary Tract: Adrenal gland shows stable hyperplastic appearance bilaterally. Left ureteral stent extends from an upper pole infundibulum down to the level of the bladder. No significant left hydronephrosis. The right kidney shows nonobstructing calculi. Stomach/Bowel: No evidence of bowel obstruction, free air or bowel inflammation. Stable diverticulosis of the colon. Vascular/Lymphatic: No enlarged lymph nodes are identified in the abdomen or pelvis. Stable atherosclerosis of the abdominal aorta and iliac arteries without evidence of aneurysm. Other: No abdominal wall hernias. 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 IMPRESSION: 1. Pulmonary findings, as  documented above, are concerning for the presence of pulmonary metastatic disease. There also may be a component of chronic heart failure and pneumonia. A right pleural effusion shows decrease in volume since 12/21. 2. New large subcapsular fluid collection with visible invagination into the liver parenchyma and mass effect on the liver consistent with spontaneous subcapsular hemorrhage. This is new since 12/21. 3. These results were called by telephone at the time of interpretation on 10/29/2016 at 3:36 pm to Dr. Lisa Roca , who verbally acknowledged these results. Electronically Signed   By: Aletta Edouard M.D.   On: 10/29/2016 15:37   Ct Chest Wo Contrast  Result Date: 10/29/2016 CLINICAL DATA:  Back pain, pleuritic chest pain, shortness of breath, abdominal pain and hypotension. Status post recent left ureteral stent placement for hydronephrosis due to distal left ureteral obstruction. EXAM: CT CHEST, ABDOMEN AND PELVIS WITHOUT CONTRAST TECHNIQUE: Multidetector CT imaging of the chest, abdomen and pelvis was performed following the standard protocol without IV contrast. COMPARISON:  CTA of the chest on 10/20/2016 CT of the abdomen and pelvis on 09/26/2016 FINDINGS: CT CHEST FINDINGS Cardiovascular: The heart size is normal. Coronary atherosclerosis is present with diffusely calcified plaque in a 3 vessel distribution. Trace pericardial fluid. Mediastinum/Nodes: Stable mildly prominent AP window and prevascular mediastinal lymph nodes. Stable mildly prominent right axillary lymph nodes. Lungs/Pleura: Combination of multifocal nodules and areas of parenchymal airspace opacity are again noted in both lungs. Some of these findings likely represent some continued component of heart failure with persistent, but smaller, right pleural effusion present. There are some concerning areas of nodularity including a 1.5 x 2.3 cm central area of spiculated nodularity in the left  upper lobe adjacent to the aortic arch,  5 mm left upper lobe nodule, 7 mm lingular nodule, 1.3 cm left lower lobe nodule abutting the major fissure and persistent 1 cm right lower lobe nodule also abutting the major fissure. Patchy areas of ground-glass and airspace opacity are present in both upper and lower lung zones and a more geographic area of airspace disease and bronchial thickening is present through much of the lower aspect of the right lower lobe. Musculoskeletal: No bony abnormalities in the chest. CT ABDOMEN PELVIS FINDINGS Hepatobiliary: Dominant new finding since recent 12/21 CT is a new, large subcapsular fluid collection along the anterior and superior margin of the right lobe of the liver measuring up to approximately 14 x 5 x 9 cm and associated with visible anterior capsular disruption and exerting mass effect on the liver. Internal fluid density is roughly 20 Hounsfield units and acute onset and appearance are consistent with spontaneous subcapsular hemorrhage. This does appear to be largely confined within the capsule as no significant intraperitoneal hemorrhage is identified. The gallbladder is unremarkable. No biliary dilatation. Pancreas: Unenhanced appearance of the pancreas is stable and unremarkable. Spleen: The spleen remains normal in size. There is a small amount of free fluid around the spleen. Adrenals/Urinary Tract: Adrenal gland shows stable hyperplastic appearance bilaterally. Left ureteral stent extends from an upper pole infundibulum down to the level of the bladder. No significant left hydronephrosis. The right kidney shows nonobstructing calculi. Stomach/Bowel: No evidence of bowel obstruction, free air or bowel inflammation. Stable diverticulosis of the colon. Vascular/Lymphatic: No enlarged lymph nodes are identified in the abdomen or pelvis. Stable atherosclerosis of the abdominal aorta and iliac arteries without evidence of aneurysm. Other: No abdominal wall hernias. Musculoskeletal: Stable degenerative disc  disease of the lumbar spine. IMPRESSION: 1. 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 There also may be a component of chronic heart failure and pneumonia. A right pleural effusion shows decrease in volume since 12/21. 2. New large subcapsular fluid collection with visible invagination into the liver parenchyma and mass effect on the liver consistent with spontaneous subcapsular hemorrhage. This is new since 12/21. 3. These results were called by telephone at the time of interpretation on 10/29/2016 at 3:36 pm to Dr. Lisa Roca , who verbally acknowledged these results. Electronically Signed   By: Aletta Edouard M.D.   On: 10/29/2016 15:37   Dg Chest Port 1 View  Result Date: 10/29/2016 CLINICAL DATA:  Recently discharged for hospital for fluid on lungs. Thoracentesis done 10-20-16. Now with increasing SOB. EXAM: PORTABLE CHEST 1 VIEW COMPARISON:  10/24/2016 FINDINGS: Cardiac silhouette is normal in size. No convincing mediastinal masses. Mild hazy perihilar and lower lung zone interstitial airspace opacities are noted, most evident at the right base. Small right pleural effusion. These findings appear mildly improved from the most recent prior exam. Upper lungs are clear. No pneumothorax. Skeletal structures are grossly intact. IMPRESSION: 1. Perihilar and right greater than left lower lung zone opacity with a small right pleural effusion. Findings may reflect asymmetric pulmonary edema. Consider multifocal pneumonia if there are consistent clinical symptoms. Overall appearance of the lungs is mildly improved when compared to most recent prior exam. Electronically Signed   By: Lajean Manes M.D.   On: 10/29/2016 10:31    Assessment/Plan:  Labs and CT scan are personally reviewed. Patient has subcapsular hematoma which is quite large but there is no sign of free fluid. This likely happened yesterday  and is not actively bleeding. For  admission and observation with cardiac monitoring. If he were to rebleed and become unstable angina ON embolism should be considered.  Also of note this patient has metastatic lung cancer and CODE STATUS needs to be addressed as that will affect any decision making concerning aggressive medical or surgical procedures. Currently there are no surgical indications in this patient. This discussed with Dr. Bing Quarry will address CODE STATUS with family.  Florene Glen, MD, FACS

## 2016-10-30 DIAGNOSIS — N179 Acute kidney failure, unspecified: Secondary | ICD-10-CM

## 2016-10-30 DIAGNOSIS — R338 Other retention of urine: Secondary | ICD-10-CM

## 2016-10-30 LAB — CBC
HEMATOCRIT: 20 % — AB (ref 40.0–52.0)
Hemoglobin: 7 g/dL — ABNORMAL LOW (ref 13.0–18.0)
MCH: 33.2 pg (ref 26.0–34.0)
MCHC: 35 g/dL (ref 32.0–36.0)
MCV: 95 fL (ref 80.0–100.0)
Platelets: 194 10*3/uL (ref 150–440)
RBC: 2.11 MIL/uL — ABNORMAL LOW (ref 4.40–5.90)
RDW: 14.8 % — AB (ref 11.5–14.5)
WBC: 12.5 10*3/uL — ABNORMAL HIGH (ref 3.8–10.6)

## 2016-10-30 LAB — BASIC METABOLIC PANEL
Anion gap: 11 (ref 5–15)
BUN: 57 mg/dL — AB (ref 6–20)
CHLORIDE: 103 mmol/L (ref 101–111)
CO2: 22 mmol/L (ref 22–32)
Calcium: 7.7 mg/dL — ABNORMAL LOW (ref 8.9–10.3)
Creatinine, Ser: 2.45 mg/dL — ABNORMAL HIGH (ref 0.61–1.24)
GFR calc Af Amer: 28 mL/min — ABNORMAL LOW (ref >60)
GFR, EST NON AFRICAN AMERICAN: 24 mL/min — AB (ref >60)
GLUCOSE: 99 mg/dL (ref 65–99)
POTASSIUM: 4.3 mmol/L (ref 3.5–5.1)
Sodium: 136 mmol/L (ref 135–145)

## 2016-10-30 LAB — HEMOGLOBIN
HEMOGLOBIN: 10.7 g/dL — AB (ref 13.0–18.0)
Hemoglobin: 7.1 g/dL — ABNORMAL LOW (ref 13.0–18.0)

## 2016-10-30 LAB — APTT: aPTT: 28 seconds (ref 24–36)

## 2016-10-30 LAB — PREPARE RBC (CROSSMATCH)

## 2016-10-30 LAB — ABO/RH: ABO/RH(D): B POS

## 2016-10-30 LAB — PROTIME-INR
INR: 1.37
Prothrombin Time: 17 seconds — ABNORMAL HIGH (ref 11.4–15.2)

## 2016-10-30 MED ORDER — SODIUM CHLORIDE 0.9 % IV SOLN
Freq: Once | INTRAVENOUS | Status: AC
  Administered 2016-10-30: 06:00:00 via INTRAVENOUS

## 2016-10-30 MED ORDER — BISACODYL 10 MG RE SUPP
10.0000 mg | Freq: Every day | RECTAL | Status: DC | PRN
  Administered 2016-10-30: 10 mg via RECTAL
  Filled 2016-10-30: qty 1

## 2016-10-30 MED ORDER — ALBUTEROL SULFATE (2.5 MG/3ML) 0.083% IN NEBU
2.5000 mg | INHALATION_SOLUTION | Freq: Four times a day (QID) | RESPIRATORY_TRACT | Status: DC
  Administered 2016-10-30 – 2016-11-02 (×16): 2.5 mg via RESPIRATORY_TRACT
  Filled 2016-10-30 (×17): qty 3

## 2016-10-30 NOTE — Progress Notes (Addendum)
Spoke with Dr. Marcille Blanco r/t pt.'s last Hgb., MD will speak with pt. Regarding consent.  Will continue to monitor pt. Closely. Alerted Dr. Dahlia Byes from general surgery who ordered additional coag labwork. Will continue to monitor pt. Closely.

## 2016-10-30 NOTE — Care Management Note (Addendum)
Case Management Note  Patient Details  Name: Edwin Moran MRN: [id number] Date of Birth: 04-21-1941  Subjective/Objective:   Notified Edwin Moran at [facility] that Edwin Moran was discharged from [facility] on 10/26/16 with home health services with Edwin by has not been seen by Edwin Moran and was readmitted to Edwin Moran on 10/29/16. Per Edwin Moran Edwin Rayman had a scheduled RN visit for today, 10/20/16, and a PT visit scheduled for 11/01/16. Received a RW and a home nebulizer upon last Moran discharge. Anticipate home with new home oxygen.                 Action/Plan:   Expected Discharge Date:                  Expected Discharge Plan:     In-House Referral:     Discharge planning Services     Post Acute Care Choice:    Choice offered to:     DME Arranged:    DME Agency:     HH Arranged:    HH Agency:     Status of Service:     If discussed at [employer] [facility] Meetings, dates discussed:    Additional Comments:  Edwin Moran A, RN 10/30/2016, 8:58 AM

## 2016-10-30 NOTE — Progress Notes (Signed)
Pharmacy Antibiotic Note  Edwin Moran is a 75 y.o. male admitted on 10/29/2016 with  pneumonia/HCAP.  Pharmacy has been consulted for Aztreonam and Vancomycin dosing. Patient is also receiving Azithromycin.   Plan: Ke: 0.023   T1/2: 30   Vd: 53  Will continue Vancomycin 1gm IV every 36 hours with 8 hours stack dosing. Plan for trough prior to 4th dose. Will monitor renal function and adjust dose as needed. Recommend D/C vanc if MRSA PCR negative.   Will continue aztreonam 1gm IV every 12 hours.   Height: '5\' 4"'$  (162.6 cm) Weight: 166 lb 10.7 oz (75.6 kg) IBW/kg (Calculated) : 59.2  Temp (24hrs), Avg:98.1 F (36.7 C), Min:96.3 F (35.7 C), Max:98.8 F (37.1 C)   Recent Labs Lab 10/24/16 0421 10/25/16 0453 10/26/16 0445 10/29/16 0832 10/29/16 1157 10/29/16 1514 10/30/16 0428  WBC  --   --   --  10.8*  --   --  12.5*  CREATININE 1.96* 1.90* 2.16* 2.58*  --   --  2.45*  LATICACIDVEN  --   --   --   --  2.6* 4.2*  --     Estimated Creatinine Clearance: 24.6 mL/min (by C-G formula based on SCr of 2.45 mg/dL (H)).    Allergies  Allergen Reactions  . Cefprozil     REACTION: Swelling  . Penicillins Swelling    Has patient had a PCN reaction causing immediate rash, facial/tongue/throat swelling, SOB or lightheadedness with hypotension: Yes Has patient had a PCN reaction causing severe rash involving mucus membranes or skin necrosis: No Has patient had a PCN reaction that required hospitalization No Has patient had a PCN reaction occurring within the last 10 years: No If all of the above answers are "NO", then may proceed with Cephalosporin use.   Marland Kitchen Potassium Chloride     REACTION: SOB    Antimicrobials this admission: 12/30 Aztreonam >>  12/30 Vancomcyin >>  12/30 azithromycin >>  Dose adjustments this admission:   Microbiology results: BCx:  UCx:   Sputum:    MRSA PCR:   Thank you for allowing pharmacy to be a part of this patient's care.  Olivia Canter, RPh Clinical Pharmacist 10/30/2016 9:17 AM

## 2016-10-30 NOTE — Progress Notes (Signed)
Patient transfer to floor; denies pain; meds as ordered; remains on O2; skin warm and dry; Will continue to monitor.

## 2016-10-30 NOTE — Consult Note (Signed)
Urology Consult  Referring physician: Dr. Vianne Bulls Reason for referral: urinary retention  Chief Complaint: suprapubic pain  History of Present Illness: Edwin Moran is a 75yo with a hx of CKD, newly diagnosed lung cancer and gross hematuria admitted with abdominal pain and shortness of breath. He was found to have a subcapsular liver hematoma. He was also complaining of urgency and an inability to urinate. A foley catheter was then placed. He now has resolution of his suprapubic pain. This is his second episode of urinary retention. He was on ditropan prior to this hospitalization for urinary urgency. He has a hx of gross hematuria and is currently being evaluated by Dr. Hollice Moran at [facility] urology. 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  Past Medical History:  Diagnosis Date  . Cancer ([place])    skin  . Complication of anesthesia 1963   pt woke up during the procedure choking on drainage.  Marland Kitchen COPD (chronic obstructive pulmonary disease) ([place])   . Diverticulosis of colon   . GERD (gastroesophageal reflux disease)   . History of GI diverticular bleed 03/20-03/31/04   [facility]  . History of hiatal hernia   . Hyperlipidemia   . Hypertension   . Osteoarthritis   . Peripheral vascular disease ([place])    right foot tunred purole  . PONV (postoperative nausea and vomiting)    at age 81 with finger surgery.  . Renal insufficiency   . Smoking    Past Surgical History:  Procedure Laterality Date  . COLONOSCOPY    . CYSTOSCOPY  1991   due to prolonged prostatitis  . CYSTOSCOPY W/ RETROGRADES Bilateral 10/10/2016   Procedure: CYSTOSCOPY WITH RETROGRADE PYELOGRAM;  Surgeon: Edwin Espy, MD;  Location: [facility] ORS;  Service: Urology;  Laterality: Bilateral;  . CYSTOSCOPY WITH BIOPSY N/A 10/10/2016   Procedure: CYSTOSCOPY WITH BIOPSY;  Surgeon: Edwin Espy, MD;  Location: [facility];  Service: Urology;  Laterality: N/A;  . CYSTOSCOPY WITH STENT PLACEMENT Left  10/10/2016   Procedure: CYSTOSCOPY WITH STENT PLACEMENT;  Surgeon: Edwin Espy, MD;  Location: [facility] OR[facility];  Service: Urology;  Laterality: Left;  . 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 PERIPHERAL VASCULAR CATHETERIZATION Right 06/20/2016   Procedure: Lower Extremity Angiography;  Surgeon: Algernon Huxley, MD;  Location: [facility];  Service: Cardiovascular;  Laterality: Right;  . URETEROSCOPY Left 10/10/2016   Procedure: URETEROSCOPY;  Surgeon: Edwin Espy, MD;  Location: [facility];  Service: Urology;  Laterality: Left;  . WISDOM TOOTH EXTRACTION      Medications: I have reviewed the patient's current medications. Allergies:  Allergies  Allergen Reactions  . Cefprozil     REACTION: Swelling  . Penicillins Swelling    Has patient had a PCN reaction causing immediate rash, facial/tongue/throat swelling, SOB or lightheadedness with hypotension: Yes Has patient had a PCN reaction causing severe rash involving mucus membranes or skin necrosis: No Has patient had a PCN reaction that required hospitalization No Has patient had a PCN reaction occurring within the last 10 years: No If all of the above answers are "NO", then may proceed with Cephalosporin use.   Marland Kitchen Potassium Chloride     REACTION: SOB    Family History  Problem Relation Age of Onset  . Asthma Mother     chronic, asthma attack caused MI  . Heart disease Mother     MI  . Thrombophlebitis Mother   . Hiatal hernia  Mother   . Prostate cancer Neg Hx   . Colon cancer Neg Hx    Social History:  reports that he quit smoking about 2 months ago. He has a 40.00 pack-year smoking history. He has never used smokeless tobacco. He reports that he drinks about 12.6 - 16.8 oz of alcohol per week . He reports that he does not use drugs.  Review of Systems  Respiratory: Positive for cough and shortness of breath.   Gastrointestinal: Positive for abdominal  pain.  Genitourinary: Positive for urgency.  Neurological: Positive for weakness.    Physical Exam:  Vital signs in last 24 hours: Temp:  [96.3 F (35.7 C)-98.8 F (37.1 C)] 98.4 F (36.9 C) (12/31 1400) Pulse Rate:  [82-104] 104 (12/31 1700) Resp:  [14-32] 28 (12/31 1700) BP: (89-156)/(54-93) 142/82 (12/31 1700) SpO2:  [90 %-99 %] 93 % (12/31 1700) Weight:  [75.6 kg (166 lb 10.7 oz)] 75.6 kg (166 lb 10.7 oz) (12/30 2041) Physical Exam  Constitutional: He is oriented to person, place, and time. He appears well-developed and well-nourished.  HENT:  Head: Normocephalic and atraumatic.  Eyes: EOM are normal. Pupils are equal, round, and reactive to light.  Neck: Normal range of motion. No thyromegaly present.  Cardiovascular: Normal rate and regular rhythm.   Respiratory: Effort normal. No respiratory distress.  GI: Soft. He exhibits no distension. Hernia confirmed negative in the right inguinal area and confirmed negative in the left inguinal area.  Genitourinary: Testes normal and penis normal. Right testis shows no mass, no swelling and no tenderness. Right testis is descended. Cremasteric reflex is not absent on the right side. Left testis shows no mass, no swelling and no tenderness. Left testis is descended. Cremasteric reflex is not absent on the left side. Circumcised.  Musculoskeletal: Normal range of motion. He exhibits no edema.  Lymphadenopathy:       Right: No inguinal adenopathy present.       Left: No inguinal adenopathy present.  Neurological: He is alert and oriented to person, place, and time.  Skin: Skin is warm and dry.  Psychiatric: He has a normal mood and affect. His behavior is normal. Judgment and thought content normal.    Laboratory Data:  Results for orders placed or performed during the hospital encounter of 10/29/16 (from the past 72 hour(s))  Comprehensive metabolic panel     Status: Abnormal   Collection Time: 10/29/16  8:32 AM  Result Value Ref  Range   Sodium 135 135 - 145 mmol/L   Potassium 3.8 3.5 - 5.1 mmol/L   Chloride 97 (L) 101 - 111 mmol/L   CO2 24 22 - 32 mmol/L   Glucose, Bld 144 (H) 65 - 99 mg/dL   BUN 54 (H) 6 - 20 mg/dL   Creatinine, Ser 2.58 (H) 0.61 - 1.24 mg/dL   Calcium 9.7 8.9 - 10.3 mg/dL   Total Protein 6.5 6.5 - 8.1 g/dL   Albumin 3.2 (L) 3.5 - 5.0 g/dL   AST 65 (H) 15 - 41 U/L   ALT 66 (H) 17 - 63 U/L   Alkaline Phosphatase 80 38 - 126 U/L   Total Bilirubin 0.8 0.3 - 1.2 mg/dL   GFR calc non Af Amer 23 (L) >60 mL/min   GFR calc Af Amer 27 (L) >60 mL/min    Comment: (NOTE) The eGFR has been calculated using the CKD EPI equation. This calculation has not been validated in all clinical situations. eGFR's persistently <60 mL/min signify possible Chronic  Kidney Disease.    Anion gap 14 5 - 15  CBC     Status: Abnormal   Collection Time: 10/29/16  8:32 AM  Result Value Ref Range   WBC 10.8 (H) 3.8 - 10.6 K/uL   RBC 3.33 (L) 4.40 - 5.90 MIL/uL   Hemoglobin 11.5 (L) 13.0 - 18.0 g/dL   HCT 31.9 (L) 40.0 - 52.0 %   MCV 95.8 80.0 - 100.0 fL   MCH 34.4 (H) 26.0 - 34.0 pg   MCHC 35.9 32.0 - 36.0 g/dL   RDW 14.6 (H) 11.5 - 14.5 %   Platelets 348 150 - 440 K/uL  Troponin I     Status: Abnormal   Collection Time: 10/29/16  8:32 AM  Result Value Ref Range   Troponin I 0.03 (HH) <0.03 ng/mL    Comment: CRITICAL RESULT CALLED TO, READ BACK BY AND VERIFIED WITH ANGELA ROBBINS @ 1050 10/29/16 BY [facility]   TSH     Status: Abnormal   Collection Time: 10/29/16  8:32 AM  Result Value Ref Range   TSH 184.000 (H) 0.350 - 4.500 uIU/mL    Comment: Performed by a 3rd Generation assay with a functional sensitivity of <=0.01 uIU/mL.  Culture, blood (routine x 2)     Status: None (Preliminary result)   Collection Time: 10/29/16  8:32 AM  Result Value Ref Range   Specimen Description BLOOD LEFT FOREARM    Special Requests      BOTTLES DRAWN AEROBIC AND ANAEROBIC  ANA 10ML AER 9ML   Culture NO GROWTH < 24 HOURS     Report Status PENDING   Culture, blood (routine x 2)     Status: None (Preliminary result)   Collection Time: 10/29/16 10:24 AM  Result Value Ref Range   Specimen Description BLOOD RIGHT ARM    Special Requests      BOTTLES DRAWN AEROBIC AND ANAEROBIC AER13ML ANA10ML   Culture NO GROWTH < 24 HOURS    Report Status PENDING   Lactic acid, plasma     Status: Abnormal   Collection Time: 10/29/16 11:57 AM  Result Value Ref Range   Lactic Acid, Venous 2.6 (HH) 0.5 - 1.9 mmol/L    Comment: CRITICAL RESULT CALLED TO, READ BACK BY AND VERIFIED WITH ALICIA GRANGER @ 5102 10/29/16 BY [facility]   Urinalysis, Complete w Microscopic     Status: Abnormal   Collection Time: 10/29/16  1:48 PM  Result Value Ref Range   Color, Urine YELLOW (A) YELLOW   APPearance CLEAR (A) CLEAR   Specific Gravity, Urine 1.013 1.005 - 1.030   pH 5.0 5.0 - 8.0   Glucose, UA NEGATIVE NEGATIVE mg/dL   Hgb urine dipstick MODERATE (A) NEGATIVE   Bilirubin Urine NEGATIVE NEGATIVE   Ketones, ur NEGATIVE NEGATIVE mg/dL   Protein, ur NEGATIVE NEGATIVE mg/dL   Nitrite NEGATIVE NEGATIVE   Leukocytes, UA LARGE (A) NEGATIVE   RBC / HPF 0-5 0 - 5 RBC/hpf   WBC, UA TOO NUMEROUS TO COUNT 0 - 5 WBC/hpf   Bacteria, UA NONE SEEN NONE SEEN   Squamous Epithelial / LPF NONE SEEN NONE SEEN   Mucous PRESENT   Lactic acid, plasma     Status: Abnormal   Collection Time: 10/29/16  3:14 PM  Result Value Ref Range   Lactic Acid, Venous 4.2 (HH) 0.5 - 1.9 mmol/L    Comment: CRITICAL RESULT CALLED TO, READ BACK BY AND VERIFIED WITH ALICIA GRANGER @ 5852 10/29/16 BY [facility]  Type and screen [place]     Status: None (Preliminary result)   Collection Time: 10/29/16  4:29 PM  Result Value Ref Range   ISSUE DATE / TIME [id number]    Blood Product Unit Number [id number]    PRODUCT CODE [id number]    Unit Type and Rh 5100    Blood Product Expiration Date 833582518984    ISSUE DATE / TIME [id number]    Blood Product  Unit Number [id number]    PRODUCT CODE [id number]    Unit Type and Rh 5100    Blood Product Expiration Date [id number]   MRSA PCR Screening     Status: None   Collection Time: 10/29/16  8:37 PM  Result Value Ref Range   MRSA by PCR NEGATIVE NEGATIVE    Comment:        The GeneXpert MRSA Assay (FDA approved for NASAL specimens only), is one component of a comprehensive MRSA colonization surveillance program. It is not intended to diagnose MRSA infection nor to guide or monitor treatment for MRSA infections.   Hemoglobin     Status: Abnormal   Collection Time: 10/29/16  9:27 PM  Result Value Ref Range   Hemoglobin 7.7 (L) 13.0 - 18.0 g/dL  Hemoglobin     Status: Abnormal   Collection Time: 10/30/16  2:02 AM  Result Value Ref Range   Hemoglobin 7.1 (L) 13.0 - 18.0 g/dL  ABO/Rh     Status: None   Collection Time: 10/30/16  2:02 AM  Result Value Ref Range   ABO/RH(D) B POS   Basic metabolic panel     Status: Abnormal   Collection Time: 10/30/16  4:28 AM  Result Value Ref Range   Sodium 136 135 - 145 mmol/L   Potassium 4.3 3.5 - 5.1 mmol/L   Chloride 103 101 - 111 mmol/L   CO2 22 22 - 32 mmol/L   Glucose, Bld 99 65 - 99 mg/dL   BUN 57 (H) 6 - 20 mg/dL   Creatinine, Ser 2.45 (H) 0.61 - 1.24 mg/dL   Calcium 7.7 (L) 8.9 - 10.3 mg/dL   GFR calc non Af Amer 24 (L) >60 mL/min   GFR calc Af Amer 28 (L) >60 mL/min    Comment: (NOTE) The eGFR has been calculated using the CKD EPI equation. This calculation has not been validated in all clinical situations. eGFR's persistently <60 mL/min signify possible Chronic Kidney Disease.    Anion gap 11 5 - 15  CBC     Status: Abnormal   Collection Time: 10/30/16  4:28 AM  Result Value Ref Range   WBC 12.5 (H) 3.8 - 10.6 K/uL   RBC 2.11 (L) 4.40 - 5.90 MIL/uL   Hemoglobin 7.0 (L) 13.0 - 18.0 g/dL   HCT 20.0 (L) 40.0 - 52.0 %   MCV 95.0 80.0 - 100.0 fL   MCH 33.2 26.0 - 34.0 pg   MCHC 35.0 32.0 - 36.0 g/dL   RDW 14.8 (H) 11.5 -  14.5 %   Platelets 194 150 - 440 K/uL  Protime-INR     Status: Abnormal   Collection Time: 10/30/16  4:28 AM  Result Value Ref Range   Prothrombin Time 17.0 (H) 11.4 - 15.2 seconds   INR 1.37   APTT     Status: None   Collection Time: 10/30/16  4:28 AM  Result Value Ref Range   aPTT 28 24 - 36 seconds  Prepare RBC     Status: None  Collection Time: 10/30/16  4:30 AM  Result Value Ref Range   Order Confirmation ORDER PROCESSED BY BLOOD BANK   Hemoglobin     Status: Abnormal   Collection Time: 10/30/16 12:45 PM  Result Value Ref Range   Hemoglobin 10.7 (L) 13.0 - 18.0 g/dL   Recent Results (from the past 240 hour(s))  Culture, blood (routine x 2)     Status: None (Preliminary result)   Collection Time: 10/29/16  8:32 AM  Result Value Ref Range Status   Specimen Description BLOOD LEFT FOREARM  Final   Special Requests   Final    BOTTLES DRAWN AEROBIC AND ANAEROBIC  ANA 10ML AER 9ML   Culture NO GROWTH < 24 HOURS  Final   Report Status PENDING  Incomplete  Culture, blood (routine x 2)     Status: None (Preliminary result)   Collection Time: 10/29/16 10:24 AM  Result Value Ref Range Status   Specimen Description BLOOD RIGHT ARM  Final   Special Requests   Final    BOTTLES DRAWN AEROBIC AND ANAEROBIC AER13ML ANA10ML   Culture NO GROWTH < 24 HOURS  Final   Report Status PENDING  Incomplete  MRSA PCR Screening     Status: None   Collection Time: 10/29/16  8:37 PM  Result Value Ref Range Status   MRSA by PCR NEGATIVE NEGATIVE Final    Comment:        The GeneXpert MRSA Assay (FDA approved for NASAL specimens only), is one component of a comprehensive MRSA colonization surveillance program. It is not intended to diagnose MRSA infection nor to guide or monitor treatment for MRSA infections.    Creatinine:  Recent Labs  10/24/16 0421 10/25/16 0453 10/26/16 0445 10/29/16 0832 10/30/16 0428  CREATININE 1.96* 1.90* 2.16* 2.58* 2.45*   Baseline Creatinine:  0.9  Impression/Assessment:  75yo with urinary retention, acute renal failure   Plan:  1. Urinary retention: Urinary retention likely related to anticholinergic therapy. Please discontinue the ditropan. The patient can be started on flomax 0.3m daily and then followup 1 week after discharge for a voiding trial.  PNicolette Bang12/31/2017, 5:58 PM

## 2016-10-30 NOTE — Clinical Social Work Note (Signed)
CSW received consult for home health concerns. RNCM aware. CSW does not assist with home health, only facility based concerns. CSW signing off.  Santiago Bumpers, MSW, LCSW-A [phone]

## 2016-10-30 NOTE — Progress Notes (Signed)
Pt. Has not voided since transfer to ICU- bladder scanned pt. Found 302 mL, pt. Wanted to attempt using urinal with help on side of bed.  2 RN's and NT assisted pt. To side of bed in case of dizziness.   Pt. Put out 50 mL, became dizzy and needed to be put back in bed. Discussed with Dr. Marcille Blanco, foley catheter ordered.

## 2016-10-30 NOTE — Progress Notes (Addendum)
Family Meeting Note  Advance Directive:no  Today a meeting took place with the Patient.  Patient is  Able too participate, mentioned that he wanted  fullcode  The following were discussed; diagnosis, advanced cancer, renal failure, COPD, anemia, pneumonia Additional follow-up to be provided:  Time spent during discussion:20 min  Epifanio Lesches, MD

## 2016-10-30 NOTE — Progress Notes (Signed)
[facility] [place] NAME: Kahli Fitzgerald    MR#:  [id number]  DATE OF BIRTH:  27-Jul-1941  SUBJECTIVE: Admitted for renal pain, hypertension, found to have multifocal pneumonia, supper She lower liver hemorrhage, now has urine retention.  And denies any complaints today, hemodynamically stable, received 2 units of blood transfusion.   CHIEF COMPLAINT:   Chief Complaint  Patient presents with  . Abdominal Pain    REVIEW OF SYSTEMS:   ROS CONSTITUTIONAL: No fever, fatigue or weakness.  EYES: No blurred or double vision.  EARS, NOSE, AND THROAT: No tinnitus or ear pain.  RESPIRATORY: No cough, shortness of breath, wheezing or hemoptysis.  CARDIOVASCULAR: No chest pain, orthopnea, edema.  GASTROINTESTINAL: No nausea, vomiting, diarrhea or abdominal pain.  GENITOURINARY: No dysuria, hematuria.  ENDOCRINE: No polyuria, nocturia,  HEMATOLOGY: No anemia, easy bruising or bleeding SKIN: No rash or lesion. MUSCULOSKELETAL: No joint pain or arthritis.   NEUROLOGIC: No tingling, numbness, weakness.  PSYCHIATRY: No anxiety or depression.   DRUG ALLERGIES:   Allergies  Allergen Reactions  . Cefprozil     REACTION: Swelling  . Penicillins Swelling    Has patient had a PCN reaction causing immediate rash, facial/tongue/throat swelling, SOB or lightheadedness with hypotension: Yes Has patient had a PCN reaction causing severe rash involving mucus membranes or skin necrosis: No Has patient had a PCN reaction that required hospitalization No Has patient had a PCN reaction occurring within the last 10 years: No If all of the above answers are "NO", then may proceed with Cephalosporin use.   Marland Kitchen Potassium Chloride     REACTION: SOB    VITALS:  Blood pressure 138/88, pulse 94, temperature 98.7 F (37.1 C), temperature source Oral, resp. rate 19, height '5\' 4"'$  (1.626 m), weight 75.6 kg (166 lb 10.7 oz), SpO2 94 %.  PHYSICAL EXAMINATION:   GENERAL:  75 y.o.-year-old patient lying in the bed with no acute distress.  EYES: Pupils equal, round, reactive to light and accommodation. No scleral icterus. Extraocular muscles intact.  HEENT: Head atraumatic, normocephalic. Oropharynx and nasopharynx clear.  NECK:  Supple, no jugular venous distention. No thyroid enlargement, no tenderness.  LUNGS: decreased  breath sounds at bases.  CARDIOVASCULAR: S1, S2 normal. No murmurs, rubs, or gallops.  ABDOMEN: Soft, nontender, nondistended. Bowel sounds present. No organomegaly or mass.  EXTREMITIES: No pedal edema, cyanosis, or clubbing.  NEUROLOGIC: Cranial nerves II through XII are intact. Muscle strength 5/5 in all extremities. Sensation intact. Gait not checked.  PSYCHIATRIC: The patient is alert and oriented x 3.  SKIN: No obvious rash, lesion, or ulcer.    LABORATORY PANEL:   CBC  Recent Labs Lab 10/30/16 0428  WBC 12.5*  HGB 7.0*  HCT 20.0*  PLT 194   ------------------------------------------------------------------------------------------------------------------  Chemistries   Recent Labs Lab 10/29/16 0832 10/30/16 0428  NA 135 136  K 3.8 4.3  CL 97* 103  CO2 24 22  GLUCOSE 144* 99  BUN 54* 57*  CREATININE 2.58* 2.45*  CALCIUM 9.7 7.7*  AST 65*  --   ALT 66*  --   ALKPHOS 80  --   BILITOT 0.8  --    ------------------------------------------------------------------------------------------------------------------  Cardiac Enzymes  Recent Labs Lab 10/29/16 0832  TROPONINI 0.03*   ------------------------------------------------------------------------------------------------------------------  RADIOLOGY:  Ct Abdomen Pelvis Wo Contrast  Result Date: 10/29/2016 CLINICAL DATA:  Back pain, pleuritic chest pain, shortness of breath, abdominal pain and hypotension. Status post recent left ureteral stent  placement for hydronephrosis due to distal left ureteral obstruction. EXAM: CT CHEST, ABDOMEN AND  PELVIS WITHOUT CONTRAST TECHNIQUE: Multidetector CT imaging of the chest, abdomen and pelvis was performed following the standard protocol without IV contrast. COMPARISON:  CTA of the chest on 10/20/2016 CT of the abdomen and pelvis on 09/26/2016 FINDINGS: CT CHEST FINDINGS Cardiovascular: The heart size is normal. Coronary atherosclerosis is present with diffusely calcified plaque in a 3 vessel distribution. Trace pericardial fluid. Mediastinum/Nodes: Stable mildly prominent AP window and prevascular mediastinal lymph nodes. Stable mildly prominent right axillary lymph nodes. Lungs/Pleura: Combination of multifocal nodules and areas of parenchymal airspace opacity are again noted in both lungs. Some of these findings likely represent some continued component of heart failure with persistent, but smaller, right pleural effusion present. There are some concerning areas of nodularity including a 1.5 x 2.3 cm central area of spiculated nodularity in the left upper lobe adjacent to the aortic arch, 5 mm left upper lobe nodule, 7 mm lingular nodule, 1.3 cm left lower lobe nodule abutting the major fissure and persistent 1 cm right lower lobe nodule also abutting the major fissure. Patchy areas of ground-glass and airspace opacity are present in both upper and lower lung zones and a more geographic area of airspace disease and bronchial thickening is present through much of the lower aspect of the right lower lobe. Musculoskeletal: No bony abnormalities in the chest. CT ABDOMEN PELVIS FINDINGS Hepatobiliary: Dominant new finding since recent 12/21 CT is a new, large subcapsular fluid collection along the anterior and superior margin of the right lobe of the liver measuring up to approximately 14 x 5 x 9 cm and associated with visible anterior capsular disruption and exerting mass effect on the liver. Internal fluid density is roughly 20 Hounsfield units and acute onset and appearance are consistent with spontaneous  subcapsular hemorrhage. This does appear to be largely confined within the capsule as no significant intraperitoneal hemorrhage is identified. The gallbladder is unremarkable. No biliary dilatation. Pancreas: Unenhanced appearance of the pancreas is stable and unremarkable. Spleen: The spleen remains normal in size. There is a small amount of free fluid around the spleen. Adrenals/Urinary Tract: Adrenal gland shows stable hyperplastic appearance bilaterally. Left ureteral stent extends from an upper pole infundibulum down to the level of the bladder. No significant left hydronephrosis. The right kidney shows nonobstructing calculi. Stomach/Bowel: No evidence of bowel obstruction, free air or bowel inflammation. Stable diverticulosis of the colon. Vascular/Lymphatic: No enlarged lymph nodes are identified in the abdomen or pelvis. Stable atherosclerosis of the abdominal aorta and iliac arteries without evidence of aneurysm. Other: No abdominal wall hernias. 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 IMPRESSION: 1. 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 There also may be a component of chronic heart failure and pneumonia. A right pleural effusion shows decrease in volume since 12/21. 2. New large subcapsular fluid collection with visible invagination into the liver parenchyma and mass effect on the liver consistent with spontaneous subcapsular hemorrhage. This is new since 12/21. 3. These results were called by telephone at the time of interpretation on 10/29/2016 at 3:36 pm to Dr. Lisa Roca , who verbally acknowledged these results. Electronically Signed   By: Aletta Edouard M.D.   On: 10/29/2016 15:37   Ct Chest Wo Contrast  Result Date: 10/29/2016 CLINICAL DATA:  Back pain, pleuritic chest pain, shortness of breath, abdominal pain and hypotension. Status post recent left ureteral stent placement  for  hydronephrosis due to distal left ureteral obstruction. EXAM: CT CHEST, ABDOMEN AND PELVIS WITHOUT CONTRAST TECHNIQUE: Multidetector CT imaging of the chest, abdomen and pelvis was performed following the standard protocol without IV contrast. COMPARISON:  CTA of the chest on 10/20/2016 CT of the abdomen and pelvis on 09/26/2016 FINDINGS: CT CHEST FINDINGS Cardiovascular: The heart size is normal. Coronary atherosclerosis is present with diffusely calcified plaque in a 3 vessel distribution. Trace pericardial fluid. Mediastinum/Nodes: Stable mildly prominent AP window and prevascular mediastinal lymph nodes. Stable mildly prominent right axillary lymph nodes. Lungs/Pleura: Combination of multifocal nodules and areas of parenchymal airspace opacity are again noted in both lungs. Some of these findings likely represent some continued component of heart failure with persistent, but smaller, right pleural effusion present. There are some concerning areas of nodularity including a 1.5 x 2.3 cm central area of spiculated nodularity in the left upper lobe adjacent to the aortic arch, 5 mm left upper lobe nodule, 7 mm lingular nodule, 1.3 cm left lower lobe nodule abutting the major fissure and persistent 1 cm right lower lobe nodule also abutting the major fissure. Patchy areas of ground-glass and airspace opacity are present in both upper and lower lung zones and a more geographic area of airspace disease and bronchial thickening is present through much of the lower aspect of the right lower lobe. Musculoskeletal: No bony abnormalities in the chest. CT ABDOMEN PELVIS FINDINGS Hepatobiliary: Dominant new finding since recent 12/21 CT is a new, large subcapsular fluid collection along the anterior and superior margin of the right lobe of the liver measuring up to approximately 14 x 5 x 9 cm and associated with visible anterior capsular disruption and exerting mass effect on the liver. Internal fluid density is roughly 20  Hounsfield units and acute onset and appearance are consistent with spontaneous subcapsular hemorrhage. This does appear to be largely confined within the capsule as no significant intraperitoneal hemorrhage is identified. The gallbladder is unremarkable. No biliary dilatation. Pancreas: Unenhanced appearance of the pancreas is stable and unremarkable. Spleen: The spleen remains normal in size. There is a small amount of free fluid around the spleen. Adrenals/Urinary Tract: Adrenal gland shows stable hyperplastic appearance bilaterally. Left ureteral stent extends from an upper pole infundibulum down to the level of the bladder. No significant left hydronephrosis. The right kidney shows nonobstructing calculi. Stomach/Bowel: No evidence of bowel obstruction, free air or bowel inflammation. Stable diverticulosis of the colon. Vascular/Lymphatic: No enlarged lymph nodes are identified in the abdomen or pelvis. Stable atherosclerosis of the abdominal aorta and iliac arteries without evidence of aneurysm. Other: No abdominal wall hernias. 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 IMPRESSION: 1. 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 There also may be a component of chronic heart failure and pneumonia. A right pleural effusion shows decrease in volume since 12/21. 2. New large subcapsular fluid collection with visible invagination into the liver parenchyma and mass effect on the liver consistent with spontaneous subcapsular hemorrhage. This is new since 12/21. 3. These results were called by telephone at the time of interpretation on 10/29/2016 at 3:36 pm to Dr. Lisa Roca , who verbally acknowledged these results. Electronically Signed   By: Aletta Edouard M.D.   On: 10/29/2016 15:37   Dg Chest Port 1 View  Result Date: 10/29/2016 CLINICAL DATA:  Recently discharged for hospital for fluid on lungs. Thoracentesis done  10-20-16. Now with increasing SOB. EXAM: PORTABLE CHEST  1 VIEW COMPARISON:  10/24/2016 FINDINGS: Cardiac silhouette is normal in size. No convincing mediastinal masses. Mild hazy perihilar and lower lung zone interstitial airspace opacities are noted, most evident at the right base. Small right pleural effusion. These findings appear mildly improved from the most recent prior exam. Upper lungs are clear. No pneumothorax. Skeletal structures are grossly intact. IMPRESSION: 1. Perihilar and right greater than left lower lung zone opacity with a small right pleural effusion. Findings may reflect asymmetric pulmonary edema. Consider multifocal pneumonia if there are consistent clinical symptoms. Overall appearance of the lungs is mildly improved when compared to most recent prior exam. Electronically Signed   By: Lajean Manes M.D.   On: 10/29/2016 10:31    EKG:   Orders placed or performed during the hospital encounter of 10/29/16  . EKG 12-Lead  . EKG 12-Lead  . ED EKG  . ED EKG    ASSESSMENT AND PLAN:  1. Abdominal  pain secondary to large subcutaneous Sure hemorrhage on the liver edge causing anemia requiring transfusions. hemo globin dropped from 11.5-7 today. Transfused 2 units of packed RBC, after transfusion hemoglobin improved to and 7, Patricia surgery following the patient. Discussed the findings with the patient. Hold blood thinners at this time.  #2. multifocal pneumonia shortness of breath, elevated white count: discharged  from hospital recently, continue IV antibiotics to treat for healthcare associated pneumonia./  #3 history of pleural effusions, hypoxia, Status post thoracocentesis right side, has malignant pleural effusion, stage IV adenocarcinoma, follow-up with Dr. Grayland Ormond is an outpatient for treatment but patient never made that appointment yet. Consult oncology while here.  #4. Patient has history of left hydronephrosis,, history of left distal ureteral obstruction with extra  extrinsic compression status post stent in the left ureter, seen by urologist Dr. Hollice Espy on December 21, now has urinary retention, request urology to see the patient again. Has UTI: Continue antibiotics for, follow urine cultures. #5/cirrhosis of the liver #6 . hypothyroidism: 7.PVD 8.CKD stage 3; 9/Chronic diastolic heart failure    All the records are reviewed and case discussed with Care Management/Social Workerr. Management plans discussed with the patient, family and they are in agreement.  CODE STATUS: Full code  TOTAL TIME(CCT)TAKING CARE OF THIS PATIENT: 68mnutes.   POSSIBLE D/C IN 2-3 DAYS, DEPENDING ON CLINICAL CONDITION.   KEpifanio LeschesM.D on 10/30/2016 at 1:27 PM  Between 7am to 6pm - Pager - [phone]  After 6pm go to [email or web address] - password [email or web address]  Office  [phone] CC: Primary care physician; GElsie Stain MD   Note: This dictation was prepared with Dragon dictation along with smaller phrase technology. Any transcriptional errors that result from this process are unintentional.

## 2016-10-31 LAB — CBC
HCT: 28.6 % — ABNORMAL LOW (ref 40.0–52.0)
Hemoglobin: 10.3 g/dL — ABNORMAL LOW (ref 13.0–18.0)
MCH: 32.7 pg (ref 26.0–34.0)
MCHC: 36.2 g/dL — ABNORMAL HIGH (ref 32.0–36.0)
MCV: 90.3 fL (ref 80.0–100.0)
PLATELETS: 155 10*3/uL (ref 150–440)
RBC: 3.16 MIL/uL — ABNORMAL LOW (ref 4.40–5.90)
RDW: 18.4 % — AB (ref 11.5–14.5)
WBC: 18.2 10*3/uL — AB (ref 3.8–10.6)

## 2016-10-31 LAB — TYPE AND SCREEN
BLOOD PRODUCT EXPIRATION DATE: 201801102359
Blood Product Expiration Date: 201801102359
ISSUE DATE / TIME: 201712310559
ISSUE DATE / TIME: 201712310938
Unit Type and Rh: 5100
Unit Type and Rh: 5100

## 2016-10-31 LAB — BASIC METABOLIC PANEL
Anion gap: 10 (ref 5–15)
BUN: 56 mg/dL — AB (ref 6–20)
CHLORIDE: 105 mmol/L (ref 101–111)
CO2: 21 mmol/L — AB (ref 22–32)
Calcium: 7.9 mg/dL — ABNORMAL LOW (ref 8.9–10.3)
Creatinine, Ser: 2.46 mg/dL — ABNORMAL HIGH (ref 0.61–1.24)
GFR calc Af Amer: 28 mL/min — ABNORMAL LOW (ref >60)
GFR calc non Af Amer: 24 mL/min — ABNORMAL LOW (ref >60)
GLUCOSE: 111 mg/dL — AB (ref 65–99)
Potassium: 3.8 mmol/L (ref 3.5–5.1)
SODIUM: 136 mmol/L (ref 135–145)

## 2016-10-31 LAB — GLUCOSE, CAPILLARY: Glucose-Capillary: 102 mg/dL — ABNORMAL HIGH (ref 65–99)

## 2016-10-31 MED ORDER — PANTOPRAZOLE SODIUM 40 MG PO TBEC
40.0000 mg | DELAYED_RELEASE_TABLET | Freq: Every day | ORAL | Status: DC
  Administered 2016-10-31 – 2016-11-02 (×3): 40 mg via ORAL
  Filled 2016-10-31 (×3): qty 1

## 2016-10-31 MED ORDER — DEXTROSE 5 % IV SOLN
1.0000 g | Freq: Three times a day (TID) | INTRAVENOUS | Status: DC
  Administered 2016-10-31 – 2016-11-02 (×7): 1 g via INTRAVENOUS
  Filled 2016-10-31 (×11): qty 1

## 2016-10-31 MED ORDER — ALUM & MAG HYDROXIDE-SIMETH 200-200-20 MG/5ML PO SUSP
30.0000 mL | ORAL | Status: DC | PRN
  Administered 2016-10-31: 30 mL via ORAL

## 2016-10-31 MED ORDER — CHLORPROMAZINE HCL 10 MG PO TABS
10.0000 mg | ORAL_TABLET | Freq: Three times a day (TID) | ORAL | Status: DC
  Administered 2016-10-31 – 2016-11-02 (×8): 10 mg via ORAL
  Filled 2016-10-31 (×9): qty 1

## 2016-10-31 NOTE — Progress Notes (Signed)
Pharmacy Antibiotic Note  Edwin Moran is a 76 y.o. male admitted on 10/29/2016 with  pneumonia/HCAP.  Pharmacy has been consulted for Aztreonam and Vancomycin dosing. Patient is also receiving Azithromycin.   This is day #3 of antibiotic therapy. Recommend discontinuing vancomycin as MRSA PCR is negative.  Plan: Patient was ordered vancomycin 1000 mg IV q36h. Will order vancomycin level for 1/3 @ 0530 to ensure vancomycin level not supratherapeutic.  Increase aztreonam to 1 g IV q8h per renal function   Height: '5\' 4"'$  (162.6 cm) Weight: 166 lb 10.7 oz (75.6 kg) IBW/kg (Calculated) : 59.2  Temp (24hrs), Avg:98 F (36.7 C), Min:97.7 F (36.5 C), Max:98.5 F (36.9 C)   Recent Labs Lab 10/25/16 0453 10/26/16 0445 10/29/16 0832 10/29/16 1157 10/29/16 1514 10/30/16 0428 10/31/16 0413  WBC  --   --  10.8*  --   --  12.5* 18.2*  CREATININE 1.90* 2.16* 2.58*  --   --  2.45* 2.46*  LATICACIDVEN  --   --   --  2.6* 4.2*  --   --     Estimated Creatinine Clearance: 24.5 mL/min (by C-G formula based on SCr of 2.46 mg/dL (H)).    Allergies  Allergen Reactions  . Cefprozil     REACTION: Swelling  . Penicillins Swelling    Has patient had a PCN reaction causing immediate rash, facial/tongue/throat swelling, SOB or lightheadedness with hypotension: Yes Has patient had a PCN reaction causing severe rash involving mucus membranes or skin necrosis: No Has patient had a PCN reaction that required hospitalization No Has patient had a PCN reaction occurring within the last 10 years: No If all of the above answers are "NO", then may proceed with Cephalosporin use.   Marland Kitchen Potassium Chloride     REACTION: SOB    Antimicrobials this admission: 12/30 Aztreonam >>  12/30 Vancomcyin >>  12/30 azithromycin >>  Dose adjustments this admission: 1/1 Increased aztreonam to 1 g IV q8h interval  Microbiology results: BCx: No growth 2 days  MRSA PCR: negative  Thank you for allowing pharmacy to  be a part of this patient's care.  Lenis Noon, PharmD, BCPS Clinical Pharmacist 10/31/2016 1:16 PM

## 2016-10-31 NOTE — Progress Notes (Signed)
Pt complaining of indigestion, heartburn.  Dr. Vianne Bulls paged.  MD order for Protonix '40mg'$  PO daily and Mylanta 85m PO q4h PRN.  Will administer after pharmacy release and continue to monitor.

## 2016-10-31 NOTE — Progress Notes (Signed)
pt states that he can take PO KCl, despite his allergy listing

## 2016-10-31 NOTE — Progress Notes (Signed)
Family Meeting Note  Advance Directive:no  Today a meeting took place with the Patient.  Patient is  Able too participate, mentioned that he wanted fullcode  The following were discussed; diagnosis, advanced cancer, renal failure, COPD, anemia, pneumonia Additional follow-up to be provided:  Time spent during discussion:20 min  Epifanio Lesches, MD

## 2016-10-31 NOTE — Progress Notes (Signed)
[place] [facility] [place] NAME: Rawson Minix    MR#:  [id number]  DATE OF BIRTH:  02-26-1941  Complains of hiccups, acidity. Shortness of breath at baseline which is not unusual for him.  CHIEF COMPLAINT:   Chief Complaint  Patient presents with  . Abdominal Pain    REVIEW OF SYSTEMS:   ROS CONSTITUTIONAL: No fever, fatigue or weakness.  EYES: No blurred or double vision.  EARS, NOSE, AND THROAT: No tinnitus or ear pain.  RESPIRATORY: Shortness of breath. No cough, no wheezing CARDIOVASCULAR: No chest pain, orthopnea, edema.  GASTROINTESTINAL: Abdominal pain, hiccups,  GENITOURINARY: No dysuria, hematuria.  ENDOCRINE: No polyuria, nocturia,  HEMATOLOGY: No anemia, easy bruising or bleeding SKIN: No rash or lesion. MUSCULOSKELETAL: No joint pain or arthritis.   NEUROLOGIC: No tingling, numbness, weakness.  PSYCHIATRY: No anxiety or depression.   DRUG ALLERGIES:   Allergies  Allergen Reactions  . Cefprozil     REACTION: Swelling  . Penicillins Swelling    Has patient had a PCN reaction causing immediate rash, facial/tongue/throat swelling, SOB or lightheadedness with hypotension: Yes Has patient had a PCN reaction causing severe rash involving mucus membranes or skin necrosis: No Has patient had a PCN reaction that required hospitalization No Has patient had a PCN reaction occurring within the last 10 years: No If all of the above answers are "NO", then may proceed with Cephalosporin use.   Marland Kitchen Potassium Chloride     REACTION: SOB    VITALS:  Blood pressure 121/69, pulse 91, temperature 98.2 F (36.8 C), temperature source Oral, resp. rate (!) 24, height '5\' 4"'$  (1.626 m), weight 75.6 kg (166 lb 10.7 oz), SpO2 96 %.  PHYSICAL EXAMINATION:  GENERAL:  76 y.o.-year-old patient lying in the bed with no acute distress.  EYES: Pupils equal, round, reactive to light and accommodation. No scleral icterus. Extraocular muscles intact.   HEENT: Head atraumatic, normocephalic. Oropharynx and nasopharynx clear.  NECK:  Supple, no jugular venous distention. No thyroid enlargement, no tenderness.  LUNGS: decreased  breath sounds at bases.  CARDIOVASCULAR: S1, S2 normal. No murmurs, rubs, or gallops.  ABDOMEN: Soft, nontender, nondistended. Bowel sounds present. No organomegaly or mass.  EXTREMITIES: No pedal edema, cyanosis, or clubbing.  NEUROLOGIC: Cranial nerves II through XII are intact. Muscle strength 5/5 in all extremities. Sensation intact. Gait not checked.  PSYCHIATRIC: The patient is alert and oriented x 3.  SKIN: No obvious rash, lesion, or ulcer.    LABORATORY PANEL:   CBC  Recent Labs Lab 10/31/16 0413  WBC 18.2*  HGB 10.3*  HCT 28.6*  PLT 155   ------------------------------------------------------------------------------------------------------------------  Chemistries   Recent Labs Lab 10/29/16 0832  10/31/16 0413  NA 135  < > 136  K 3.8  < > 3.8  CL 97*  < > 105  CO2 24  < > 21*  GLUCOSE 144*  < > 111*  BUN 54*  < > 56*  CREATININE 2.58*  < > 2.46*  CALCIUM 9.7  < > 7.9*  AST 65*  --   --   ALT 66*  --   --   ALKPHOS 80  --   --   BILITOT 0.8  --   --   < > = values in this interval not displayed. ------------------------------------------------------------------------------------------------------------------  Cardiac Enzymes  Recent Labs Lab 10/29/16 0832  TROPONINI 0.03*   ------------------------------------------------------------------------------------------------------------------  RADIOLOGY:  Ct Abdomen Pelvis Wo Contrast  Result Date: 10/29/2016 CLINICAL  DATA:  Back pain, pleuritic chest pain, shortness of breath, abdominal pain and hypotension. Status post recent left ureteral stent placement for hydronephrosis due to distal left ureteral obstruction. EXAM: CT CHEST, ABDOMEN AND PELVIS WITHOUT CONTRAST TECHNIQUE: Multidetector CT imaging of the chest, abdomen and  pelvis was performed following the standard protocol without IV contrast. COMPARISON:  CTA of the chest on 10/20/2016 CT of the abdomen and pelvis on 09/26/2016 FINDINGS: CT CHEST FINDINGS Cardiovascular: The heart size is normal. Coronary atherosclerosis is present with diffusely calcified plaque in a 3 vessel distribution. Trace pericardial fluid. Mediastinum/Nodes: Stable mildly prominent AP window and prevascular mediastinal lymph nodes. Stable mildly prominent right axillary lymph nodes. Lungs/Pleura: Combination of multifocal nodules and areas of parenchymal airspace opacity are again noted in both lungs. Some of these findings likely represent some continued component of heart failure with persistent, but smaller, right pleural effusion present. There are some concerning areas of nodularity including a 1.5 x 2.3 cm central area of spiculated nodularity in the left upper lobe adjacent to the aortic arch, 5 mm left upper lobe nodule, 7 mm lingular nodule, 1.3 cm left lower lobe nodule abutting the major fissure and persistent 1 cm right lower lobe nodule also abutting the major fissure. Patchy areas of ground-glass and airspace opacity are present in both upper and lower lung zones and a more geographic area of airspace disease and bronchial thickening is present through much of the lower aspect of the right lower lobe. Musculoskeletal: No bony abnormalities in the chest. CT ABDOMEN PELVIS FINDINGS Hepatobiliary: Dominant new finding since recent 12/21 CT is a new, large subcapsular fluid collection along the anterior and superior margin of the right lobe of the liver measuring up to approximately 14 x 5 x 9 cm and associated with visible anterior capsular disruption and exerting mass effect on the liver. Internal fluid density is roughly 20 Hounsfield units and acute onset and appearance are consistent with spontaneous subcapsular hemorrhage. This does appear to be largely confined within the capsule as no  significant intraperitoneal hemorrhage is identified. The gallbladder is unremarkable. No biliary dilatation. Pancreas: Unenhanced appearance of the pancreas is stable and unremarkable. Spleen: The spleen remains normal in size. There is a small amount of free fluid around the spleen. Adrenals/Urinary Tract: Adrenal gland shows stable hyperplastic appearance bilaterally. Left ureteral stent extends from an upper pole infundibulum down to the level of the bladder. No significant left hydronephrosis. The right kidney shows nonobstructing calculi. Stomach/Bowel: No evidence of bowel obstruction, free air or bowel inflammation. Stable diverticulosis of the colon. Vascular/Lymphatic: No enlarged lymph nodes are identified in the abdomen or pelvis. Stable atherosclerosis of the abdominal aorta and iliac arteries without evidence of aneurysm. Other: No abdominal wall hernias. 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 IMPRESSION: 1. 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 There also may be a component of chronic heart failure and pneumonia. A right pleural effusion shows decrease in volume since 12/21. 2. New large subcapsular fluid collection with visible invagination into the liver parenchyma and mass effect on the liver consistent with spontaneous subcapsular hemorrhage. This is new since 12/21. 3. These results were called by telephone at the time of interpretation on 10/29/2016 at 3:36 pm to Dr. Lisa Roca , who verbally acknowledged these results. Electronically Signed   By: Aletta Edouard M.D.   On: 10/29/2016 15:37   Ct Chest Wo Contrast  Result Date: 10/29/2016 CLINICAL DATA:  Back pain, pleuritic chest pain, shortness of breath, abdominal pain and hypotension. Status post recent left ureteral stent placement for hydronephrosis due to distal left ureteral obstruction. EXAM: CT CHEST, ABDOMEN AND PELVIS WITHOUT  CONTRAST TECHNIQUE: Multidetector CT imaging of the chest, abdomen and pelvis was performed following the standard protocol without IV contrast. COMPARISON:  CTA of the chest on 10/20/2016 CT of the abdomen and pelvis on 09/26/2016 FINDINGS: CT CHEST FINDINGS Cardiovascular: The heart size is normal. Coronary atherosclerosis is present with diffusely calcified plaque in a 3 vessel distribution. Trace pericardial fluid. Mediastinum/Nodes: Stable mildly prominent AP window and prevascular mediastinal lymph nodes. Stable mildly prominent right axillary lymph nodes. Lungs/Pleura: Combination of multifocal nodules and areas of parenchymal airspace opacity are again noted in both lungs. Some of these findings likely represent some continued component of heart failure with persistent, but smaller, right pleural effusion present. There are some concerning areas of nodularity including a 1.5 x 2.3 cm central area of spiculated nodularity in the left upper lobe adjacent to the aortic arch, 5 mm left upper lobe nodule, 7 mm lingular nodule, 1.3 cm left lower lobe nodule abutting the major fissure and persistent 1 cm right lower lobe nodule also abutting the major fissure. Patchy areas of ground-glass and airspace opacity are present in both upper and lower lung zones and a more geographic area of airspace disease and bronchial thickening is present through much of the lower aspect of the right lower lobe. Musculoskeletal: No bony abnormalities in the chest. CT ABDOMEN PELVIS FINDINGS Hepatobiliary: Dominant new finding since recent 12/21 CT is a new, large subcapsular fluid collection along the anterior and superior margin of the right lobe of the liver measuring up to approximately 14 x 5 x 9 cm and associated with visible anterior capsular disruption and exerting mass effect on the liver. Internal fluid density is roughly 20 Hounsfield units and acute onset and appearance are consistent with spontaneous subcapsular  hemorrhage. This does appear to be largely confined within the capsule as no significant intraperitoneal hemorrhage is identified. The gallbladder is unremarkable. No biliary dilatation. Pancreas: Unenhanced appearance of the pancreas is stable and unremarkable. Spleen: The spleen remains normal in size. There is a small amount of free fluid around the spleen. Adrenals/Urinary Tract: Adrenal gland shows stable hyperplastic appearance bilaterally. Left ureteral stent extends from an upper pole infundibulum down to the level of the bladder. No significant left hydronephrosis. The right kidney shows nonobstructing calculi. Stomach/Bowel: No evidence of bowel obstruction, free air or bowel inflammation. Stable diverticulosis of the colon. Vascular/Lymphatic: No enlarged lymph nodes are identified in the abdomen or pelvis. Stable atherosclerosis of the abdominal aorta and iliac arteries without evidence of aneurysm. Other: No abdominal wall hernias. 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 IMPRESSION: 1. 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 There also may be a component of chronic heart failure and pneumonia. A right pleural effusion shows decrease in volume since 12/21. 2. New large subcapsular fluid collection with visible invagination into the liver parenchyma and mass effect on the liver consistent with spontaneous subcapsular hemorrhage. This is new since 12/21. 3. These results were called by telephone at the time of interpretation on 10/29/2016 at 3:36 pm to Dr. Lisa Roca , who verbally acknowledged these results. Electronically Signed   By: Aletta Edouard M.D.   On: 10/29/2016 15:37    EKG:   Orders placed or performed during the hospital encounter  of 10/29/16  . EKG 12-Lead  . EKG 12-Lead  . ED EKG  . ED EKG    ASSESSMENT AND PLAN:  1. Abdominal  pain secondary to large subcutaneous hemorrhage on  the liver edge causing anemia requiring transfusions. hemo globin dropped from 11.5-7 . Transfused 2 units of packed RBC, after transfusion hemoglobin improved 10.7 surgery following the patient. Discussed the findings with the patient. Hold blood thinners at this time.  #2. multifocal pneumonia shortness of breath, elevated white count: discharged  from hospital recently, continue IV antibiotics to treat for healthcare associated pneumonia./  #3 history of pleural effusions, hypoxia, Status post thoracocentesis right side, has malignant pleural effusion, stage IV adenocarcinoma, follow-up with Dr. Grayland Ormond is an outpatient for treatment but patient never made that appointment yet. Consult oncology while here.  #4. Patient has history of left hydronephrosis,, history of left distal ureteral obstruction with extra extrinsic compression status post stent in the left ureter, seen by urologist Dr. Hollice Espy on December 21, now has urinary retention,  Seen by urology, started on Flomax, continue Foley  #5/cirrhosis of the liver.  #6 . Hypothyroidism:  7.PVD  8.CKD stage 3; renal function stable.  9/Chronic diastolic heart failure  #02. hiccups, GERD: Started on Thorazine, continue PI physical therapy consult  All the records are reviewed and case discussed with Care Management/Social Workerr. Management plans discussed with the patient, family and they are in agreement.  CODE STATUS: Full code  TOTAL TIME(CCT)TAKING CARE OF THIS PATIENT: 36mnutes.   POSSIBLE D/C IN 2-3 DAYS, DEPENDING ON CLINICAL CONDITION.   KEpifanio LeschesM.D on 10/31/2016 at 12:54 PM  Between 7am to 6pm - Pager - [phone]  After 6pm go to [email or web address] - password [email or web address]  Office  [phone] CC: Primary care physician; GElsie Stain MD   Note: This dictation was prepared with Dragon dictation along with smaller phrase technology. Any transcriptional errors that  result from this process are unintentional.

## 2016-11-01 ENCOUNTER — Inpatient Hospital Stay: Payer: Medicare Other

## 2016-11-01 ENCOUNTER — Institutional Professional Consult (permissible substitution): Payer: Medicare Other | Admitting: Pulmonary Disease

## 2016-11-01 DIAGNOSIS — K7689 Other specified diseases of liver: Principal | ICD-10-CM

## 2016-11-01 LAB — COMPREHENSIVE METABOLIC PANEL
ALT: 327 U/L — ABNORMAL HIGH (ref 17–63)
ANION GAP: 8 (ref 5–15)
AST: 64 U/L — AB (ref 15–41)
Albumin: 2.6 g/dL — ABNORMAL LOW (ref 3.5–5.0)
Alkaline Phosphatase: 123 U/L (ref 38–126)
BILIRUBIN TOTAL: 1 mg/dL (ref 0.3–1.2)
BUN: 64 mg/dL — AB (ref 6–20)
CO2: 20 mmol/L — ABNORMAL LOW (ref 22–32)
Calcium: 7.5 mg/dL — ABNORMAL LOW (ref 8.9–10.3)
Chloride: 104 mmol/L (ref 101–111)
Creatinine, Ser: 3.04 mg/dL — ABNORMAL HIGH (ref 0.61–1.24)
GFR, EST AFRICAN AMERICAN: 22 mL/min — AB (ref >60)
GFR, EST NON AFRICAN AMERICAN: 19 mL/min — AB (ref >60)
Glucose, Bld: 136 mg/dL — ABNORMAL HIGH (ref 65–99)
Potassium: 4.6 mmol/L (ref 3.5–5.1)
Sodium: 132 mmol/L — ABNORMAL LOW (ref 135–145)
TOTAL PROTEIN: 5.7 g/dL — AB (ref 6.5–8.1)

## 2016-11-01 LAB — CBC
HCT: 25.3 % — ABNORMAL LOW (ref 40.0–52.0)
HEMATOCRIT: 24.5 % — AB (ref 40.0–52.0)
HEMOGLOBIN: 8.7 g/dL — AB (ref 13.0–18.0)
Hemoglobin: 8.4 g/dL — ABNORMAL LOW (ref 13.0–18.0)
MCH: 31.5 pg (ref 26.0–34.0)
MCH: 31 pg (ref 26.0–34.0)
MCHC: 34.4 g/dL (ref 32.0–36.0)
MCHC: 34.3 g/dL (ref 32.0–36.0)
MCV: 91.4 fL (ref 80.0–100.0)
MCV: 90.3 fL (ref 80.0–100.0)
Platelets: 124 10*3/uL — ABNORMAL LOW (ref 150–440)
Platelets: 127 10*3/uL — ABNORMAL LOW (ref 150–440)
RBC: 2.77 MIL/uL — ABNORMAL LOW (ref 4.40–5.90)
RBC: 2.71 MIL/uL — ABNORMAL LOW (ref 4.40–5.90)
RDW: 17.7 % — ABNORMAL HIGH (ref 11.5–14.5)
RDW: 17.9 % — AB (ref 11.5–14.5)
WBC: 15 10*3/uL — ABNORMAL HIGH (ref 3.8–10.6)
WBC: 14.1 10*3/uL — ABNORMAL HIGH (ref 3.8–10.6)

## 2016-11-01 MED ORDER — SODIUM CHLORIDE 0.9% FLUSH
3.0000 mL | Freq: Two times a day (BID) | INTRAVENOUS | Status: DC
  Administered 2016-11-01 – 2016-11-02 (×2): 3 mL via INTRAVENOUS

## 2016-11-01 MED ORDER — IOPAMIDOL (ISOVUE-300) INJECTION 61%
15.0000 mL | INTRAVENOUS | Status: AC
  Administered 2016-11-01 (×2): 15 mL via ORAL

## 2016-11-01 MED ORDER — SODIUM CHLORIDE 0.9% FLUSH
3.0000 mL | Freq: Two times a day (BID) | INTRAVENOUS | Status: DC
  Administered 2016-11-01 – 2016-11-02 (×3): 3 mL via INTRAVENOUS

## 2016-11-01 NOTE — Progress Notes (Signed)
[facility] [place] NAME: Edwin Moran    MR#:  [id number]  DATE OF BIRTH:  [birth year]  Patient says that he feels slightly better today less symptomatic of's, less the GERD symptoms. But has shortness of breath, wheezing, hypoxia with minimal exertion, some abdominal pain with minimal by mouth intake. Nausea or vomiting, no diarrhea. Foley is draining clear yellow urine.   CHIEF COMPLAINT:   Chief Complaint  Patient presents with  . Abdominal Pain    REVIEW OF SYSTEMS:   ROS CONSTITUTIONAL: No fever, fatigue or weakness.  EYES: No blurred or double vision.  EARS, NOSE, AND THROAT: No tinnitus or ear pain.  RESPIRATORY: Shortness of breath. No cough, no wheezing CARDIOVASCULAR: No chest pain, orthopnea, edema.  GASTROINTESTINAL: Abdominal pain in Right upper quadrant.,  GENITOURINARY: No dysuria, hematuria.  ENDOCRINE: No polyuria, nocturia,  HEMATOLOGY: No anemia, easy bruising or bleeding SKIN: No rash or lesion. MUSCULOSKELETAL: No joint pain or arthritis.   NEUROLOGIC: No tingling, numbness, weakness.  PSYCHIATRY: No anxiety or depression.   DRUG ALLERGIES:   Allergies  Allergen Reactions  . Cefprozil     REACTION: Swelling  . Penicillins Swelling    Has patient had a PCN reaction causing immediate rash, facial/tongue/throat swelling, SOB or lightheadedness with hypotension: Yes Has patient had a PCN reaction causing severe rash involving mucus membranes or skin necrosis: No Has patient had a PCN reaction that required hospitalization No Has patient had a PCN reaction occurring within the last 10 years: No If all of the above answers are "NO", then may proceed with Cephalosporin use.   Marland Kitchen Potassium Chloride     REACTION: SOB Patient has since tolerated    VITALS:  Blood pressure 115/69, pulse 94, temperature 97.7 F (36.5 C), temperature source Oral, resp. rate 20, height '5\' 4"'$  (1.626 m), weight 75.6 kg (166 lb  10.7 oz), SpO2 97 %.  PHYSICAL EXAMINATION:  GENERAL:  76 y.o.-year-old patient lying in the bed with no acute distress.  EYES: Pupils equal, round, reactive to light and accommodation. No scleral icterus. Extraocular muscles intact.  HEENT: Head atraumatic, normocephalic. Oropharynx and nasopharynx clear.  NECK:  Supple, no jugular venous distention. No thyroid enlargement, no tenderness.  LUNGS: decreased  breath sounds at bases. She has expiratory wheeze in all lung fields. CARDIOVASCULAR: S1, S2 normal. No murmurs, rubs, or gallops.  ABDOMEN: Soft, nontender, nondistended. Bowel sounds present. No organomegaly or mass.  EXTREMITIES: No pedal edema, cyanosis, or clubbing.  NEUROLOGIC: Cranial nerves II through XII are intact. Muscle strength 5/5 in all extremities. Sensation intact. Gait not checked.  PSYCHIATRIC: The patient is alert and oriented x 3.  SKIN: No obvious rash, lesion, or ulcer.    LABORATORY PANEL:   CBC  Recent Labs Lab 11/01/16 0516  WBC 15.0*  HGB 8.7*  HCT 25.3*  PLT 124*   ------------------------------------------------------------------------------------------------------------------  Chemistries   Recent Labs Lab 10/29/16 0832  10/31/16 0413  NA 135  < > 136  K 3.8  < > 3.8  CL 97*  < > 105  CO2 24  < > 21*  GLUCOSE 144*  < > 111*  BUN 54*  < > 56*  CREATININE 2.58*  < > 2.46*  CALCIUM 9.7  < > 7.9*  AST 65*  --   --   ALT 66*  --   --   ALKPHOS 80  --   --   BILITOT 0.8  --   --   < > =  values in this interval not displayed. ------------------------------------------------------------------------------------------------------------------  Cardiac Enzymes  Recent Labs Lab 10/29/16 0832  TROPONINI 0.03*   ------------------------------------------------------------------------------------------------------------------  RADIOLOGY:  No results found.  EKG:   Orders placed or performed during the hospital encounter of 10/29/16   . EKG 12-Lead  . EKG 12-Lead  . ED EKG  . ED EKG    ASSESSMENT AND PLAN:  1. Abdominal  pain secondary to large subcutaneous hemorrhage on the liver edge causing anemia requiring transfusions. hemo globin dropped from 11.5-7 . Transfused 2 units of packed RBC, after transfusion hemoglobin improved 10.7 But again dropped to 8.7 today. Requested surgery to follow the patient, repeat abdominal CAT scan today without contrast to follow up on subcapsular hemorrhage of liver.  #2. multifocal pneumonia , sepsis present on admission secondary to pneumonia ;shortness of breath, elevated white count: discharged  from hospital recently, continue IV antibiotics to treat for healthcare associated pneumonia./ Continue vancomycin, azithromycin, steroids. WBC decreased from 18.2-15.  #3 history of pleural effusions, hypoxia, Status post thoracocentesis right side, has malignant pleural effusion, stage IV adenocarcinoma, follow-up with Dr. Grayland Ormond is an outpatient for treatment but patient never made that appointment yet. Consult oncology while here. Patient today asked me where his cancer is ,looks like he forgot the details I explained to him about pleural effusion, status post thoracocentesis on  Right lung,during the last discharge.  #4. Patient has history of left hydronephrosis,, history of left distal ureteral obstruction with extra extrinsic compression status post stent in the left ureter, seen by urologist Dr. Hollice Espy on December 21, now has urinary retention,  Seen by urology, started on Flomax, continue Foley.  #5/cirrhosis of the liver.  #6 . Hypothyroidism:  7.PVD  8.CKD stage 3; renal function stable. Baseline creatinine around 1.9. Creatinine stable around 2.4 during this hospitalization.  9/Chronic diastolic heart failure   #75. hiccups, GERD: Started on Thorazine, continue replace, hiccups are better.  physical therapy consult  All the records are reviewed and case discussed  with Care Management/Social Workerr. Management plans discussed with the patient, family and they are in agreement.  CODE STATUS: Full code  TOTAL TIME(CCT)TAKING CARE OF THIS PATIENT: 58mnutes.   POSSIBLE D/C IN 2-3 DAYS, DEPENDING ON CLINICAL CONDITION.   KEpifanio LeschesM.D on 11/01/2016 at 11:55 AM  Between 7am to 6pm - Pager - [phone]  After 6pm go to [email or web address] - password [email or web address]  Office  [phone] CC: Primary care physician; GElsie Stain MD   Note: This dictation was prepared with Dragon dictation along with smaller phrase technology. Any transcriptional errors that result from this process are unintentional.

## 2016-11-01 NOTE — Telephone Encounter (Signed)
-----   Message from Hollice Espy, MD sent at 10/28/2016 12:46 PM EST ----- Regarding: follow up This patient was sent from our clinic to the ED due to hypoxia.  As such, follow up was not arranged that day.  He needs to be seen in 3-4 weeks to discuss plan for ureteral stent.  It looks like he was discharged from the hospital just 2 days ago and was diagnosed with a lung cancer. It would be good to see him after he seen the cancer center and a treatment plan is in place.  Hollice Espy, MD

## 2016-11-01 NOTE — Evaluation (Signed)
Physical Therapy Evaluation Patient Details Name: Edwin Moran MRN: [id number] DOB: 02-17-1941 Today's Date: 11/01/2016   History of Present Illness  Pt is a 76 yo M admitted to hospital with abdominal pain secondary to large subcutaneous hemorrhage on the liver edge causing anemia requiring transfusions, multifocal pneumonia, sepsis present on admission secondary to pneumonia, shortness of breath, elevated white count.  Pt has a h/o history of pleural effusions, hypoxia, Status post thoracocentesis right side, has malignant pleural effusion, stage IV adenocarcinoma, left hydronephrosis, left distal ureteral obstruction with extra extrinsic compression status post stent in the left ureter.    Clinical Impression  Pt presents with deficits in strength, transfers, mobility, gait, balance, and activity tolerance.  Pt required mod A with sup to sit and min A with sit to sup.  Pt on 2.5LO2/min during session with SpO2 decreasing from 94% at baseline to 91% after going sup to sit and sitting at EOB, HR increased from 98 to 105 bpm.  Pt required several minutes with cues for breathing technique for SpO2 and HR to return closer to baseline and for SOB to subside.  Pt able to stand from EOB with CGA but was effortful with cues needed for sequencing.  Pt then ambulated around 6' with CGA and RW again with increased SOB, SpO2 to 89-90%, and HR to 110 bpm.  Pt returned to sitting and again required 3-4 min for vitals and SOB to return to near baseline.  Pt will benefit from PT services to address above deficits for decreased caregiver assistance upon discharge.      Follow Up Recommendations SNF    Equipment Recommendations  None recommended by PT    Recommendations for Other Services       Precautions / Restrictions Precautions Precautions: Fall Restrictions Weight Bearing Restrictions: No      Mobility  Bed Mobility Overal bed mobility: Needs Assistance Bed Mobility: Supine to Sit;Sit to  Supine     Supine to sit: Mod assist Sit to supine: Min assist   General bed mobility comments: Pt unable to scoot up in bed, required +2 max A  Transfers Overall transfer level: Needs assistance Equipment used: Rolling walker (2 wheeled) Transfers: Sit to/from Stand Sit to Stand: Min guard         General transfer comment: Mod verbal cues for proper sequencing with transfers  Ambulation/Gait Ambulation/Gait assistance: Min guard Ambulation Distance (Feet): 6 Feet Assistive device: Rolling walker (2 wheeled) Gait Pattern/deviations: Trunk flexed;Decreased step length - left;Decreased step length - right   Gait velocity interpretation: Below normal speed for age/gender General Gait Details: Gait steady but effortful with poor activity tolerance  Stairs Stairs:  (deferred)          Wheelchair Mobility    Modified Rankin (Stroke Patients Only)       Balance Overall balance assessment: Needs assistance Sitting-balance support: Bilateral upper extremity supported Sitting balance-Leahy Scale: Good     Standing balance support: Bilateral upper extremity supported Standing balance-Leahy Scale: Fair                               Pertinent Vitals/Pain Pain Assessment: 0-10 Pain Score: 1  Pain Location: Abdominal area Pain Descriptors / Indicators: Aching Pain Intervention(s): Monitored during session    Home Living Family/patient expects to be discharged to:: Private residence Living Arrangements: Children Available Help at Discharge: Family;Available PRN/intermittently Type of Home: House Home Access: Stairs to enter Entrance Stairs-Rails:  None Entrance Stairs-Number of Steps: 1 Home Layout: Two level;Able to live on main level with bedroom/bathroom Home Equipment: Gilford Rile - 2 wheels;Walker - 4 wheels      Prior Function Level of Independence: Independent with assistive device(s)         Comments: Pt had been Ind with amb without AD  prior to recent prior hospitalization but since returning home has been using RW, Ind with ADLs, no fall history      Hand Dominance        Extremity/Trunk Assessment        Lower Extremity Assessment Lower Extremity Assessment: Generalized weakness       Communication   Communication: No difficulties  Cognition Arousal/Alertness: Awake/alert Behavior During Therapy: WFL for tasks assessed/performed Overall Cognitive Status: Within Functional Limits for tasks assessed                      General Comments      Exercises Total Joint Exercises Ankle Circles/Pumps: AROM;Both;10 reps;15 reps Quad Sets: AROM;Both;10 reps;15 reps Gluteal Sets: AROM;Both;10 reps Hip ABduction/ADduction: AAROM;Both;10 reps Straight Leg Raises: AAROM;Both;10 reps Long Arc Quad: AROM;Both;10 reps Knee Flexion: AROM;Both;10 reps   Assessment/Plan    PT Assessment Patient needs continued PT services  PT Problem List Decreased strength;Decreased activity tolerance;Decreased balance;Decreased mobility          PT Treatment Interventions DME instruction;Gait training;Stair training;Functional mobility training;Neuromuscular re-education;Balance training;Therapeutic exercise;Therapeutic activities;Patient/family education    PT Goals (Current goals can be found in the Care Plan section)  Acute Rehab PT Goals Patient Stated Goal: To improve my strength and endurance PT Goal Formulation: With patient Time For Goal Achievement: 11/14/16 Potential to Achieve Goals: Fair    Frequency Min 2X/week   Barriers to discharge Decreased caregiver support      Co-evaluation               End of Session Equipment Utilized During Treatment: Oxygen;Gait belt Activity Tolerance: Patient limited by fatigue Patient left: in bed;with bed alarm set;with SCD's reapplied;with call bell/phone within reach;with family/visitor present           Time: 5436-0677 PT Time Calculation (min)  (ACUTE ONLY): 39 min   Charges:   PT Evaluation $PT Eval Moderate Complexity: 1 Procedure PT Treatments $Therapeutic Exercise: 8-22 mins   PT G Codes:        DRoyetta Asal PT, DPT 11/01/16, 4:01 PM

## 2016-11-01 NOTE — Progress Notes (Signed)
Notified Dr. Vianne Bulls that the patient has had low urine output from the foley (20m/hr) since I arrived at 7:00am.  She acknowledged and just said to bladder scan and encourage he drinks fluids.

## 2016-11-01 NOTE — Telephone Encounter (Signed)
done

## 2016-11-01 NOTE — Progress Notes (Signed)
Patient was bladder scanned only had 7m in his bladder.  I encouraged the patient to drink plenty of water.

## 2016-11-01 NOTE — Progress Notes (Addendum)
11/01/2016  Subjective: Patient seen as an initial consult on 12/30 in the ED for new large subcapsular liver hematoma in the setting of mild hypotension, shortness of breath, and abdominal pain.  His initial Hgb was 11.5 which dropped to 7.0, requiring blood transfusion.  This improved to 10.7 on 12/31, remaining stable on 1/1, but decreased again today to 8.7.  This has been stable x 2 checks today.  The patient has remained hemodynamically stable today.  Reports having shortness of breath which has been similar to his prior admission and is due to a pneumonia and  right pleural effusion from metastatic lung cancer.  He does have some abdominal pain in RUQ to palpation but denies any nausea or vomiting and has been tolerating a heart healthy diet in-house.   Vital signs: Temp:  [97.6 F (36.4 C)-98 F (36.7 C)] 98 F (36.7 C) (01/02 1342) Pulse Rate:  [75-100] 98 (01/02 1544) Resp:  [16-22] 16 (01/02 1342) BP: (101-121)/(59-72) 121/72 (01/02 1342) SpO2:  [94 %-97 %] 94 % (01/02 1544)   Intake/Output: 01/01 0701 - 01/02 0700 In: 1173.9 [P.O.:780; I.V.:3; IV Piggyback:390.9] Out: 700 [Urine:700] Last BM Date: 10/30/16  Physical Exam: Constitutional:  No acute distress Cardiac:  RRR, no murmurs Pulm:  Decreased breath sounds bilaterally with bilateral wheezing Abdomen:  Soft, nondistended, with mild tenderness in RUQ with palpation.  Not peritoneal.  Labs:   Recent Labs  11/01/16 0516 11/01/16 1225  WBC 15.0* 14.1*  HGB 8.7* 8.4*  HCT 25.3* 24.5*  PLT 124* 127*    Recent Labs  10/31/16 0413 11/01/16 1225  NA 136 132*  K 3.8 4.6  CL 105 104  CO2 21* 20*  GLUCOSE 111* 136*  BUN 56* 64*  CREATININE 2.46* 3.04*  CALCIUM 7.9* 7.5*    Recent Labs  10/30/16 0428  LABPROT 17.0*  INR 1.37    Imaging: No results found.  Assessment/Plan: 76 yo male with a large liver subcapsular hematoma, requiring blood transfusion on admission.  --Currently Hgb stable today over  past two checks.  Patient is scheduled for repeat CT scan to evaluate progression of hematoma. --No acute general surgery intervention is needed at this time.  If there is any concern for further bleeding, decreasing Hgb, hypotension, then the patient would need embolization with vascular surgery.  If no embolization is available, then the patient should be transferred to a tertiary care center.  Patient may benefit from tertiary center / trauma center in case he needs acute surgical management of liver hemorrhage, particularly given his comorbidities. --Will continue following along with you.   Melvyn Neth, [place]

## 2016-11-02 ENCOUNTER — Ambulatory Visit (HOSPITAL_COMMUNITY)
Admission: AD | Admit: 2016-11-02 | Discharge: 2016-11-02 | Disposition: A | Discharge status: Home / Self Care | Payer: Medicare Other | Source: Other Acute Inpatient Hospital | Attending: Internal Medicine | Admitting: Internal Medicine

## 2016-11-02 DIAGNOSIS — M1990 Unspecified osteoarthritis, unspecified site: Secondary | ICD-10-CM

## 2016-11-02 DIAGNOSIS — Z8042 Family history of malignant neoplasm of prostate: Secondary | ICD-10-CM

## 2016-11-02 DIAGNOSIS — Z880 Allergy status to penicillin: Secondary | ICD-10-CM

## 2016-11-02 DIAGNOSIS — R339 Retention of urine, unspecified: Secondary | ICD-10-CM

## 2016-11-02 DIAGNOSIS — Z85828 Personal history of other malignant neoplasm of skin: Secondary | ICD-10-CM

## 2016-11-02 DIAGNOSIS — E785 Hyperlipidemia, unspecified: Secondary | ICD-10-CM

## 2016-11-02 DIAGNOSIS — F1010 Alcohol abuse, uncomplicated: Secondary | ICD-10-CM

## 2016-11-02 DIAGNOSIS — R109 Unspecified abdominal pain: Secondary | ICD-10-CM

## 2016-11-02 DIAGNOSIS — J910 Malignant pleural effusion: Secondary | ICD-10-CM

## 2016-11-02 DIAGNOSIS — J449 Chronic obstructive pulmonary disease, unspecified: Secondary | ICD-10-CM

## 2016-11-02 DIAGNOSIS — X58XXXA Exposure to other specified factors, initial encounter: Secondary | ICD-10-CM | POA: Insufficient documentation

## 2016-11-02 DIAGNOSIS — K219 Gastro-esophageal reflux disease without esophagitis: Secondary | ICD-10-CM

## 2016-11-02 DIAGNOSIS — Z79899 Other long term (current) drug therapy: Secondary | ICD-10-CM

## 2016-11-02 DIAGNOSIS — I739 Peripheral vascular disease, unspecified: Secondary | ICD-10-CM

## 2016-11-02 DIAGNOSIS — C3490 Malignant neoplasm of unspecified part of unspecified bronchus or lung: Secondary | ICD-10-CM

## 2016-11-02 DIAGNOSIS — R0602 Shortness of breath: Secondary | ICD-10-CM

## 2016-11-02 DIAGNOSIS — I10 Essential (primary) hypertension: Secondary | ICD-10-CM

## 2016-11-02 DIAGNOSIS — Z87891 Personal history of nicotine dependence: Secondary | ICD-10-CM

## 2016-11-02 DIAGNOSIS — Z800 Family history of malignant neoplasm of digestive organs: Secondary | ICD-10-CM

## 2016-11-02 DIAGNOSIS — T148XXA Other injury of unspecified body region, initial encounter: Secondary | ICD-10-CM | POA: Insufficient documentation

## 2016-11-02 LAB — PROTIME-INR
INR: 1.26
Prothrombin Time: 15.9 seconds — ABNORMAL HIGH (ref 11.4–15.2)

## 2016-11-02 LAB — CBC
HEMATOCRIT: 25.5 % — AB (ref 40.0–52.0)
Hemoglobin: 8.7 g/dL — ABNORMAL LOW (ref 13.0–18.0)
MCH: 31.2 pg (ref 26.0–34.0)
MCHC: 33.9 g/dL (ref 32.0–36.0)
MCV: 92 fL (ref 80.0–100.0)
Platelets: 132 10*3/uL — ABNORMAL LOW (ref 150–440)
RBC: 2.78 MIL/uL — ABNORMAL LOW (ref 4.40–5.90)
RDW: 17.7 % — AB (ref 11.5–14.5)
WBC: 13 10*3/uL — ABNORMAL HIGH (ref 3.8–10.6)

## 2016-11-02 LAB — BASIC METABOLIC PANEL
Anion gap: 9 (ref 5–15)
BUN: 71 mg/dL — ABNORMAL HIGH (ref 6–20)
CALCIUM: 7.4 mg/dL — AB (ref 8.9–10.3)
CO2: 21 mmol/L — AB (ref 22–32)
CREATININE: 3.76 mg/dL — AB (ref 0.61–1.24)
Chloride: 101 mmol/L (ref 101–111)
GFR calc non Af Amer: 15 mL/min — ABNORMAL LOW (ref >60)
GFR, EST AFRICAN AMERICAN: 17 mL/min — AB (ref >60)
Glucose, Bld: 135 mg/dL — ABNORMAL HIGH (ref 65–99)
Potassium: 4.9 mmol/L (ref 3.5–5.1)
SODIUM: 131 mmol/L — AB (ref 135–145)

## 2016-11-02 LAB — CREATININE, SERUM
Creatinine, Ser: 3.48 mg/dL — ABNORMAL HIGH (ref 0.61–1.24)
GFR calc non Af Amer: 16 mL/min — ABNORMAL LOW (ref >60)
GFR, EST AFRICAN AMERICAN: 18 mL/min — AB (ref >60)

## 2016-11-02 MED ORDER — TAMSULOSIN HCL 0.4 MG PO CAPS
0.4000 mg | ORAL_CAPSULE | Freq: Every day | ORAL | Status: DC
  Administered 2016-11-02: 0.4 mg via ORAL
  Filled 2016-11-02: qty 1

## 2016-11-02 MED ORDER — SENNA 8.6 MG PO TABS
2.0000 | ORAL_TABLET | Freq: Every day | ORAL | Status: DC
  Administered 2016-11-02: 17.2 mg via ORAL
  Filled 2016-11-02: qty 2

## 2016-11-02 MED ORDER — CHLORPROMAZINE HCL 10 MG PO TABS: 10.0000 mg | ORAL_TABLET | Freq: Three times a day (TID) | ORAL | 0 refills | Status: AC

## 2016-11-02 MED ORDER — DEXTROSE 5 % IV SOLN: 1.0000 g | Freq: Three times a day (TID) | INTRAVENOUS | 0 refills | Status: AC

## 2016-11-02 MED ORDER — PANTOPRAZOLE SODIUM 40 MG PO TBEC: 40.0000 mg | DELAYED_RELEASE_TABLET | Freq: Every day | ORAL | 2 refills | Status: AC

## 2016-11-02 MED ORDER — TAMSULOSIN HCL 0.4 MG PO CAPS: 0.4000 mg | ORAL_CAPSULE | Freq: Every day | ORAL | 0 refills | Status: AC

## 2016-11-02 MED ORDER — POLYETHYLENE GLYCOL 3350 17 G PO PACK: 17.0000 g | PACK | Freq: Every day | ORAL | Status: DC | PRN

## 2016-11-02 NOTE — Discharge Summary (Signed)
[facility] NAME: Edwin Moran    MR#:  [id number]  DATE OF BIRTH:  1940-11-09  DATE OF ADMISSION:  10/29/2016   ADMITTING PHYSICIAN: Henreitta Leber, MD  DATE OF DISCHARGE: 11/02/2016  PRIMARY CARE PHYSICIAN: Elsie Stain, MD   ADMISSION DIAGNOSIS:   Back pain [M54.9] Hypoxia [R09.02] Subcapsular hematoma of liver [K76.89] Acute renal failure, unspecified acute renal failure type ([place]) [N17.9] Multifocal pneumonia [J18.9]  DISCHARGE DIAGNOSIS:   Active Problems:   Abdominal pain   SECONDARY DIAGNOSIS:   Past Medical History:  Diagnosis Date  . Cancer ([place])    skin  . Complication of anesthesia 1963   pt woke up during the procedure choking on drainage.  Marland Kitchen COPD (chronic obstructive pulmonary disease) ([place])   . Diverticulosis of colon   . GERD (gastroesophageal reflux disease)   . History of GI diverticular bleed 03/20-03/31/04   [facility]  . History of hiatal hernia   . Hyperlipidemia   . Hypertension   . Osteoarthritis   . Peripheral vascular disease ([place])    right foot tunred purole  . PONV (postoperative nausea and vomiting)    at age 46 with finger surgery.  . Renal insufficiency   . 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  #1 acute hypoxic respiratory failure-secondary to metastatic adenocarcinoma of lung-new diagnosis and also for multifocal pneumonia. -Is being treated as HCAP. Vancomycin has been discontinued since blood cultures are negative. -Currently on aztreonam. -Currently requiring 2 L oxygen. Follow-up x-ray today -Likely has underlying COPD due to long  history of smoking. Continued Dulera and when necessary nebs. -Also on prednisone taper.  #2 abdominal pain-secondary to subcapsular liver hematoma. Seen by surgery, initially conservative management recommended. -Since repeat CT of the abdomen showing worsening of the hematoma, patient might need vascular surgery intervention for embolization and if continues to get worse, we'll need to have exploratory laparotomy to control the bleeding. -However since such procedures cannot be done and will require a trauma Center, possible transfer will be arranged. -Patient is aware and agreeable. - no h/o trauma that would explain the etiology of the bleed. Was on aspirin and plavix as outpatient which have been discontinued since admission  #3 acute renal failure-normal creatinine prior to hydronephrosis and ureteral stents placement 2 months ago. -Creatinine has been around 1.7 since then. Now acute worsening noted. - likely ATN. -CT of the abdomen showing mild right-sided hydronephrosis with no obstructing calculus noted. Left ureteral stent is in place. Foley catheter is present at this time. -Continue to monitor, nephrology will be consulted.  #4 acute on chronic anemia-admission hemoglobin was 11, dropped to 7 secondary to spontaneous bleeding around the liver. Received 2 units packed RBC transfusion. Hemoglobin has been stable around 8 at this time. -Repeat imaging showing worsening subcapsular hemorrhage. -Discussing with surgery to transfer the patient. -Support with transfusion as needed until then.  #5 BPH- flomax  #6 Hypothyroidism- on synthroid  #7 DVT prophylaxis- TEDs and SCDs   Case discussed with medical attending and emergency general surgeon on call at [facility] and they have agreed to accept the transfer. Will be transferred when bed available.  DISCHARGE CONDITIONS:   Guarded  CONSULTS OBTAINED:   Treatment Team:  Hollice Espy, MD Cleon Gustin,  MD Olean Ree, MD  DRUG ALLERGIES:   Allergies  Allergen Reactions  . Cefprozil     REACTION: Swelling  . Penicillins Swelling    Has patient had a PCN reaction causing immediate rash, facial/tongue/throat swelling, SOB or lightheadedness with hypotension: Yes Has patient had a PCN reaction causing severe rash involving mucus membranes or skin necrosis: No Has patient had a PCN reaction that required hospitalization No Has patient had a PCN reaction occurring within the last 10 years: No If all of the above answers are "NO", then may proceed with Cephalosporin use.   Marland Kitchen Potassium Chloride     REACTION: SOB Patient has since tolerated   DISCHARGE MEDICATIONS:   Allergies as of 11/02/2016      Reactions   Cefprozil    REACTION: Swelling   Penicillins Swelling   Has patient had a PCN reaction causing immediate rash, facial/tongue/throat swelling, SOB or lightheadedness with hypotension: Yes Has patient had a PCN reaction causing severe rash involving mucus membranes or skin necrosis: No Has patient had a PCN reaction that required hospitalization No Has patient had a PCN reaction occurring within the last 10 years: No If all of the above answers are "NO", then may proceed with Cephalosporin use.   Potassium Chloride    REACTION: SOB Patient has since tolerated      Medication List    STOP taking these medications   aspirin EC 81 MG tablet   clopidogrel 75 MG tablet Commonly known as:  PLAVIX   furosemide 40 MG tablet Commonly known as:  LASIX   levofloxacin 250 MG tablet Commonly known as:  LEVAQUIN   potassium chloride SA 20 MEQ tablet Commonly known as:  K-DUR,KLOR-CON     TAKE these medications   albuterol 108 (90 Base) MCG/ACT inhaler Commonly known as:  PROVENTIL HFA;VENTOLIN HFA Inhale 2 puffs into the lungs every 6 (six) hours as needed for wheezing or shortness of breath.   albuterol (2.5 MG/3ML) 0.083% nebulizer solution Commonly known as:   PROVENTIL Take 3 mLs (2.5 mg total) by nebulization 4 (four) times daily.   atorvastatin 40 MG tablet Commonly known as:  LIPITOR Take 1 tablet (40 mg total) by mouth daily.   aztreonam 1 g in dextrose 5 % 50 mL Inject 1 g into the vein every 8 (eight) hours.   budesonide-formoterol 160-4.5 MCG/ACT inhaler Commonly known as:  SYMBICORT Inhale 2 puffs into the lungs 2 (two) times daily.   chlorproMAZINE 10 MG tablet Commonly known as:  THORAZINE Take 1 tablet (10 mg total) by mouth 3 (three) times daily.   cholecalciferol 1000 units tablet Commonly known as:  VITAMIN D Take 1,000 Units by mouth daily.   docusate sodium 100 MG capsule Commonly known as:  COLACE Take 1 capsule (100 mg total) by mouth 2 (two) times daily.   HYDROcodone-acetaminophen 5-325 MG tablet Commonly known as:  NORCO/VICODIN Take 1-2 tablets by mouth every 6 (six) hours as needed for moderate pain.   levothyroxine 50 MCG tablet Commonly known as:  SYNTHROID, LEVOTHROID Take 1 tablet (50 mcg total) by mouth daily before breakfast.   nystatin 100000 UNIT/ML suspension Commonly known as:  MYCOSTATIN Take 5 mLs (500,000 Units total) by mouth 4 (four) times daily.   oxybutynin 5 MG tablet Commonly known as:  DITROPAN Take 1 tablet (5 mg total) by mouth every 8 (eight)  hours as needed for bladder spasms.   pantoprazole 40 MG tablet Commonly known as:  PROTONIX Take 1 tablet (40 mg total) by mouth daily. Start taking on:  11/03/2016   predniSONE 5 MG tablet Commonly known as:  DELTASONE 4 tabs po day1; 3 tabs po day2; 2 tabs po day3; 1 tab po day4,5 then stop   tamsulosin 0.4 MG Caps capsule Commonly known as:  FLOMAX Take 1 capsule (0.4 mg total) by mouth daily. Start taking on:  11/03/2016        DISCHARGE INSTRUCTIONS:   Patient will be transferred to [facility].  DIET:   Heart Healthy   ACTIVITY:   As tolerated  OXYGEN:   Home Oxygen: Yes Oxygen Delivery: 2  liters/min via nasal cannula  DISCHARGE LOCATION:   [facility]   If you experience worsening of your admission symptoms, develop shortness of breath, life threatening emergency, suicidal or homicidal thoughts you must seek medical attention immediately by calling 911 or calling your MD immediately  if symptoms less severe.  You Must read complete instructions/literature along with all the possible adverse reactions/side effects for all the Medicines you take and that have been prescribed to you. Take any new Medicines after you have completely understood and accpet all the possible adverse reactions/side effects.   Please note  You were cared for by a hospitalist during your hospital stay. If you have any questions about your discharge medications or the care you received while you were in the hospital after you are discharged, you can call the unit and asked to speak with the hospitalist on call if the hospitalist that took care of you is not available. Once you are discharged, your primary care physician will handle any further medical issues. Please note that NO REFILLS for any discharge medications will be authorized once you are discharged, as it is imperative that you return to your primary care physician (or establish a relationship with a primary care physician if you do not have one) for your aftercare needs so that they can reassess your need for medications and monitor your lab values.    On the day of Discharge:  VITAL SIGNS:   Blood pressure 109/74, pulse 95, temperature 98.2 F (36.8 C), temperature source Oral, resp. rate 17, height '5\' 4"'$  (1.626 m), weight 75.6 kg (166 lb 10.7 oz), SpO2 94 %.  PHYSICAL EXAMINATION:   GENERAL:  76 y.o.-year-old patient lying in the bed with no acute distress. Appears ill. EYES: Pupils equal, round, reactive to light and accommodation. No scleral icterus. Extraocular muscles intact.  HEENT: Head atraumatic, normocephalic.  Oropharynx and nasopharynx clear.  NECK:  Supple, no jugular venous distention. No thyroid enlargement, no tenderness.  LUNGS: scattered wheezing bilaterally, no rales,rhonchi or crepitation. No use of accessory muscles of respiration.  CARDIOVASCULAR: S1, S2 normal. No murmurs, rubs, or gallops.  ABDOMEN: Soft, diffuse tenderness, worse in the RUQ and flank region, nondistended. Bowel sounds present. No organomegaly or mass.  EXTREMITIES: No pedal edema, cyanosis, or clubbing.  NEUROLOGIC: Cranial nerves II through XII are intact. Muscle strength 5/5 in all extremities. Sensation intact. Gait not checked. Global weakness present. PSYCHIATRIC: The patient is alert and oriented x 3.  SKIN: No obvious rash, lesion, or ulcer.    DATA REVIEW:   CBC  Recent Labs Lab 11/02/16 1455  WBC 13.0*  HGB 8.7*  HCT 25.5*  PLT 132*    Chemistries   Recent Labs Lab 11/01/16 1225  11/02/16 1455  NA 132*  --  131*  K 4.6  --  4.9  CL 104  --  101  CO2 20*  --  21*  GLUCOSE 136*  --  135*  BUN 64*  --  71*  CREATININE 3.04*  < > 3.76*  CALCIUM 7.5*  --  7.4*  AST 64*  --   --   ALT 327*  --   --   ALKPHOS 123  --   --   BILITOT 1.0  --   --   < > = values in this interval not displayed.   Microbiology Results  Results for orders placed or performed during the hospital encounter of 10/29/16  Culture, blood (routine x 2)     Status: None (Preliminary result)   Collection Time: 10/29/16  8:32 AM  Result Value Ref Range Status   Specimen Description BLOOD LEFT FOREARM  Final   Special Requests   Final    BOTTLES DRAWN AEROBIC AND ANAEROBIC  ANA 10ML AER 9ML   Culture NO GROWTH 4 DAYS  Final   Report Status PENDING  Incomplete  Culture, blood (routine x 2)     Status: None (Preliminary result)   Collection Time: 10/29/16 10:24 AM  Result Value Ref Range Status   Specimen Description BLOOD RIGHT ARM  Final   Special Requests   Final    BOTTLES DRAWN AEROBIC AND ANAEROBIC AER13ML  ANA10ML   Culture NO GROWTH 4 DAYS  Final   Report Status PENDING  Incomplete  MRSA PCR Screening     Status: None   Collection Time: 10/29/16  8:37 PM  Result Value Ref Range Status   MRSA by PCR NEGATIVE NEGATIVE Final    Comment:        The GeneXpert MRSA Assay (FDA approved for NASAL specimens only), is one component of a comprehensive MRSA colonization surveillance program. It is not intended to diagnose MRSA infection nor to guide or monitor treatment for MRSA infections.     RADIOLOGY:  Ct Abdomen Pelvis Wo Contrast  Result Date: 11/01/2016 CLINICAL DATA:  Generalized abdominal pain secondary to a large subcutaneous hemorrhage on the liver causing anemia EXAM: CT ABDOMEN AND PELVIS WITHOUT CONTRAST TECHNIQUE: Multidetector CT imaging of the abdomen and pelvis was performed following the standard protocol without IV contrast. COMPARISON:  10/29/2016, 09/26/2016, 08/27/2016 FINDINGS: Lower chest: Mild interstitial thickening within the right middle lobe and right lower lobe, could reflect edema. Worsened airspace disease in the right lower lobe may represent a pneumonia. Persistent moderate right-sided pleural effusion with loculation along the posterior right lower lobe. Mild left lower lobe infiltrate. Small left pleural effusion. Coronary artery calcifications. Trace pericardial effusion or thickening. Normal heart size. Hepatobiliary: The liver is markedly abnormal in appearance. Interval increase in size of large subcapsular hematoma with new the matted crit levels and increased density within subcapsular fluid consistent with interval hemorrhage. When measured in a similar fashion compared to the previous exam, the hematoma measures 15 x 5.8 by 12.2 cm, compared with 14 x 5 x 9 cm previously. Suspect that this is an underestimation of the true size of the hematoma hematoma is suspected to be isodense to liver parenchyma posteriorly due to interval hemorrhage. The posterior right  hepatic lobe parenchyma is heterogenous. Focal hypodense lesion within the posterior right hepatic lobe with central increased density, measuring 4.9 cm, series 2, image number 22 increased in size and possibly representing additional hematoma. No biliary dilatation. Calcifications in  the porta hepatis are again noted. Gallbladder shows no wall thickening. Pancreas: Unremarkable. No pancreatic ductal dilatation or surrounding inflammatory changes. Spleen: Normal in size without focal abnormality. Adrenals/Urinary Tract: Marked nodular enlargement of the bilateral adrenal glands as before. Multiple hypodense probable cysts within the kidneys. Mild right hydronephrosis and hydroureter but without obstructing calculus. Multiple calcifications within the bilateral kidneys could reflect small stones or vascular calcification. Left-sided ureteral stent is in place. There is no change in residual mild to moderate hydronephrosis and hydroureter of the left kidney. A Foley catheter is present in the bladder. There is a small amount of gas within the anterior aspect of the bladder. Stomach/Bowel: The stomach is nonenlarged. There is no dilated small bowel. 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 Vascular/Lymphatic: Dense atherosclerotic calcification of the aorta. Focally ectatic distal abdominal aorta up to 2.6 cm as before. Stable retroperitoneal nodes. Reproductive: No mass visualized. Other: Small amount of free fluid around the spleen and inferior aspect of the liver. No free air. Musculoskeletal: No suspicious bone lesions. Advanced degenerative changes of the lumbar spine. There is a soft tissue mass posterior to the left iliopsoas muscle, series 2, image number 49, this measures 3.4 cm oblique axial by 5.2 cm oblique craniocaudad, this is stable compared with 10/29/2016 but new compared with 08/27/2016. IMPRESSION: 1. Markedly abnormal appearance of the liver. Interval increase in  size of large subcapsular hematoma, measuring at least 15 cm, suspect that this is an underestimation of the size of the hematoma. New hematocrit levels and increased attenuation within the hematoma is compatible with interval bleeding. Suspect that the hematoma extends posteriorly over the dome of the liver but hematoma is difficult to separate from underlying liver parenchyma due to interval hemorrhage. Additional 4.9 cm subcapsular hypodensity with central increased density posterior right hepatic lobe, increased, could reflect additional hematoma. 2. Small left pleural effusion and persistent small moderate right pleural effusion. Increased airspace disease in the right lower lobe suggests a pneumonia. 3. New small amount of fluid around the liver and spleen. 4. Marked nodular enlargement of the adrenal glands as before 5. Residual moderate hydronephrosis of left kidney with ureteral stent in place. No change in mild right hydronephrosis and hydroureter. 6. 5.2 cm tissue mass within the left retroperitoneum, posterior to the ileus psoas muscle. Electronically Signed   By: Donavan Foil M.D.   On: 11/01/2016 22:00     Management plans discussed with the patient, family and they are in agreement.  CODE STATUS:     Code Status Orders        Start     Ordered   10/29/16 2037  Full code  Continuous     10/29/16 2036    Code Status History    Date Active Date Inactive Code Status Order ID Comments User Context   10/20/2016  2:04 PM 10/26/2016  6:09 PM Full Code [id number]  Bettey Costa, MD ED   06/20/2016  9:48 AM 06/20/2016  2:49 PM Full Code [id number]  Algernon Huxley, MD Inpatient    Advance Directive Documentation   Flowsheet Row Most Recent Value  Type of Advance Directive  Healthcare Power of Attorney, Living will  Pre-existing out of facility DNR order (yellow form or pink MOST form)  No data  "MOST" Form in Place?  No data      TOTAL TIME TAKING CARE OF THIS PATIENT: 38 minutes.     Gladstone Lighter M.D on 11/02/2016 at 4:56 PM  Between  7am to 6pm - Pager - [phone]  After 6pm go to [email or web address] - password [email or web address] [facility]  [employer] [place] [facility]  Office  [phone]  CC: Primary care physician; Elsie Stain, MD   Note: This dictation was prepared with Dragon dictation along with smaller phrase technology. Any transcriptional errors that result from this process are unintentional.

## 2016-11-02 NOTE — Progress Notes (Signed)
[facility] NAME: Edwin Moran    MR#:  [id number]  DATE OF BIRTH:  Nov 11, 1940  SUBJECTIVE:  CHIEF COMPLAINT:   Chief Complaint  Patient presents with  . Abdominal Pain   - has diffuse abdominal pain, worsening renal function - has a foley for urinary retention - worsening subcapsular hematoma of liver on CT  REVIEW OF SYSTEMS:  Review of Systems  Constitutional: Positive for malaise/fatigue. Negative for chills, fever and weight loss.  HENT: Negative for ear discharge, hearing loss and nosebleeds.   Eyes: Negative for blurred vision, double vision and photophobia.  Respiratory: Positive for shortness of breath. Negative for cough, hemoptysis and wheezing.   Cardiovascular: Positive for orthopnea. Negative for chest pain, palpitations and leg swelling.  Gastrointestinal: Positive for abdominal pain. Negative for constipation, diarrhea, heartburn, melena, nausea and vomiting.  Genitourinary: Negative for dysuria and urgency.  Musculoskeletal: Negative for myalgias and neck pain.  Skin: Negative for rash.  Neurological: Negative for dizziness, sensory change, speech change, focal weakness and headaches.  Endo/Heme/Allergies: Does not bruise/bleed easily.  Psychiatric/Behavioral: Negative for depression.    DRUG ALLERGIES:   Allergies  Allergen Reactions  . Cefprozil     REACTION: Swelling  . Penicillins Swelling    Has patient had a PCN reaction causing immediate rash, facial/tongue/throat swelling, SOB or lightheadedness with hypotension: Yes Has patient had a PCN reaction causing severe rash involving mucus membranes or skin necrosis: No Has patient had a PCN reaction that required hospitalization No Has patient had a PCN reaction occurring within the last 10 years: No If all of the above answers are "NO", then may proceed with Cephalosporin use.   Marland Kitchen Potassium Chloride     REACTION: SOB Patient has since tolerated      VITALS:  Blood pressure 109/74, pulse 95, temperature 98.2 F (36.8 C), temperature source Oral, resp. rate 17, height '5\' 4"'$  (1.626 m), weight 75.6 kg (166 lb 10.7 oz), SpO2 94 %.  PHYSICAL EXAMINATION:  Physical Exam  GENERAL:  76 y.o.-year-old patient lying in the bed with no acute distress. Appears ill. EYES: Pupils equal, round, reactive to light and accommodation. No scleral icterus. Extraocular muscles intact.  HEENT: Head atraumatic, normocephalic. Oropharynx and nasopharynx clear.  NECK:  Supple, no jugular venous distention. No thyroid enlargement, no tenderness.  LUNGS: scattered wheezing bilaterally, no rales,rhonchi or crepitation. No use of accessory muscles of respiration.  CARDIOVASCULAR: S1, S2 normal. No murmurs, rubs, or gallops.  ABDOMEN: Soft, diffuse tenderness, worse in the RUQ and flank region, nondistended. Bowel sounds present. No organomegaly or mass.  EXTREMITIES: No pedal edema, cyanosis, or clubbing.  NEUROLOGIC: Cranial nerves II through XII are intact. Muscle strength 5/5 in all extremities. Sensation intact. Gait not checked. Global weakness present. PSYCHIATRIC: The patient is alert and oriented x 3.  SKIN: No obvious rash, lesion, or ulcer.    LABORATORY PANEL:   CBC  Recent Labs Lab 11/01/16 1225  WBC 14.1*  HGB 8.4*  HCT 24.5*  PLT 127*   ------------------------------------------------------------------------------------------------------------------  Chemistries   Recent Labs Lab 11/01/16 1225 11/02/16 0415  NA 132*  --   K 4.6  --   CL 104  --   CO2 20*  --   GLUCOSE 136*  --   BUN 64*  --   CREATININE 3.04* 3.48*  CALCIUM 7.5*  --   AST 64*  --   ALT 327*  --   ALKPHOS 123  --  BILITOT 1.0  --    ------------------------------------------------------------------------------------------------------------------  Cardiac Enzymes  Recent Labs Lab 10/29/16 0832  TROPONINI 0.03*    ------------------------------------------------------------------------------------------------------------------  RADIOLOGY:  Ct Abdomen Pelvis Wo Contrast  Result Date: 11/01/2016 CLINICAL DATA:  Generalized abdominal pain secondary to a large subcutaneous hemorrhage on the liver causing anemia EXAM: CT ABDOMEN AND PELVIS WITHOUT CONTRAST TECHNIQUE: Multidetector CT imaging of the abdomen and pelvis was performed following the standard protocol without IV contrast. COMPARISON:  10/29/2016, 09/26/2016, 08/27/2016 FINDINGS: Lower chest: Mild interstitial thickening within the right middle lobe and right lower lobe, could reflect edema. Worsened airspace disease in the right lower lobe may represent a pneumonia. Persistent moderate right-sided pleural effusion with loculation along the posterior right lower lobe. Mild left lower lobe infiltrate. Small left pleural effusion. Coronary artery calcifications. Trace pericardial effusion or thickening. Normal heart size. Hepatobiliary: The liver is markedly abnormal in appearance. Interval increase in size of large subcapsular hematoma with new the matted crit levels and increased density within subcapsular fluid consistent with interval hemorrhage. When measured in a similar fashion compared to the previous exam, the hematoma measures 15 x 5.8 by 12.2 cm, compared with 14 x 5 x 9 cm previously. Suspect that this is an underestimation of the true size of the hematoma hematoma is suspected to be isodense to liver parenchyma posteriorly due to interval hemorrhage. The posterior right hepatic lobe parenchyma is heterogenous. Focal hypodense lesion within the posterior right hepatic lobe with central increased density, measuring 4.9 cm, series 2, image number 22 increased in size and possibly representing additional hematoma. No biliary dilatation. Calcifications in the porta hepatis are again noted. Gallbladder shows no wall thickening. Pancreas: Unremarkable. No  pancreatic ductal dilatation or surrounding inflammatory changes. Spleen: Normal in size without focal abnormality. Adrenals/Urinary Tract: Marked nodular enlargement of the bilateral adrenal glands as before. Multiple hypodense probable cysts within the kidneys. Mild right hydronephrosis and hydroureter but without obstructing calculus. Multiple calcifications within the bilateral kidneys could reflect small stones or vascular calcification. Left-sided ureteral stent is in place. There is no change in residual mild to moderate hydronephrosis and hydroureter of the left kidney. A Foley catheter is present in the bladder. There is a small amount of gas within the anterior aspect of the bladder. Stomach/Bowel: The stomach is nonenlarged. There is no dilated small bowel. 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 Vascular/Lymphatic: Dense atherosclerotic calcification of the aorta. Focally ectatic distal abdominal aorta up to 2.6 cm as before. Stable retroperitoneal nodes. Reproductive: No mass visualized. Other: Small amount of free fluid around the spleen and inferior aspect of the liver. No free air. Musculoskeletal: No suspicious bone lesions. Advanced degenerative changes of the lumbar spine. There is a soft tissue mass posterior to the left iliopsoas muscle, series 2, image number 49, this measures 3.4 cm oblique axial by 5.2 cm oblique craniocaudad, this is stable compared with 10/29/2016 but new compared with 08/27/2016. IMPRESSION: 1. Markedly abnormal appearance of the liver. Interval increase in size of large subcapsular hematoma, measuring at least 15 cm, suspect that this is an underestimation of the size of the hematoma. New hematocrit levels and increased attenuation within the hematoma is compatible with interval bleeding. Suspect that the hematoma extends posteriorly over the dome of the liver but hematoma is difficult to separate from underlying liver parenchyma due  to interval hemorrhage. Additional 4.9 cm subcapsular hypodensity with central increased density posterior right hepatic lobe, increased, could reflect additional hematoma. 2. Small left  pleural effusion and persistent small moderate right pleural effusion. Increased airspace disease in the right lower lobe suggests a pneumonia. 3. New small amount of fluid around the liver and spleen. 4. Marked nodular enlargement of the adrenal glands as before 5. Residual moderate hydronephrosis of left kidney with ureteral stent in place. No change in mild right hydronephrosis and hydroureter. 6. 5.2 cm tissue mass within the left retroperitoneum, posterior to the ileus psoas muscle. Electronically Signed   By: Donavan Foil M.D.   On: 11/01/2016 22:00    EKG:   Orders placed or performed during the hospital encounter of 10/29/16  . EKG 12-Lead  . EKG 12-Lead  . ED EKG  . 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  #1 acute hypoxic respiratory failure-secondary to metastatic adenocarcinoma of lung-new diagnosis and also for multifocal pneumonia. -Is being treated as HCAP. Vancomycin has been discontinued since blood cultures are negative. -Currently on aztreonam. -Currently requiring 2 L oxygen. Follow-up x-ray today -Likely has underlying COPD due to long history of smoking. Continued Dulera and when necessary nebs. -Also on prednisone taper.  #2 abdominal pain-secondary to subcapsular liver hematoma. Seen by surgery, initially conservative management recommended. -Since repeat CT of the abdomen showing worsening of the hematoma, patient might need vascular surgery  intervention for embolization and if continues to get worse, we'll need to have exploratory laparotomy to control the bleeding. -However since such procedures cannot be done and will require a trauma Center, possible transfer will be arranged. -Patient is aware and agreeable.  #3 acute renal failure-normal creatinine prior to hydronephrosis and ureteral stents placement 2 months ago. -Creatinine has been around 1.7 since then. Now acute worsening noted. - likely ATN. -CT of the abdomen showing mild right-sided hydronephrosis with no obstructing calculus noted. Left ureteral stent is in place. Foley catheter is present at this time. -Continue to monitor, nephrology will be consulted.  #4 acute on chronic anemia-admission hemoglobin was 11, dropped to 7 secondary to spontaneous bleeding around the liver. Received 2 units packed RBC transfusion. Hemoglobin has been stable around 8 at this time. -Repeat imaging showing worsening subcapsular hemorrhage. -Discussing with surgery to transfer the patient. -Support with transfusion as needed until then.  #5 BPH- flomax  #6 Hypothyroidism- on synthroid  #7 DVT prophylaxis- TEDs and SCDs    All the records are reviewed and case discussed with Care Management/Social Workerr. Management plans discussed with the patient, family and they are in agreement.  CODE STATUS: Full Code  TOTAL TIME TAKING CARE OF THIS PATIENT: 37 minutes.   POSSIBLE D/C IN 2 DAYS, DEPENDING ON CLINICAL CONDITION.   Gladstone Lighter M.D on 11/02/2016 at 2:05 PM  Between 7am to 6pm - Pager - [phone]  After 6pm go to [email or web address] - password [email or web address] [place] [facility]  Office  [phone]  CC: Primary care physician; Elsie Stain, MD

## 2016-11-02 NOTE — Progress Notes (Signed)
11/02/2016 7:17 PM  Called report to receiving Drucie Opitz at [facility], where pt is transferring.  Dola Argyle, RN

## 2016-11-02 NOTE — Progress Notes (Signed)
Pharmacy Antibiotic Note  Edwin Moran is a 76 y.o. male admitted on 10/29/2016 with  pneumonia/HCAP.  Pharmacy has been consulted for Aztreonam and dosing  Patient with lung cancer and pleural effusion, recently admitted with PNA, discharged on levofloxacin 12/28  Plan: Day # 5 of antibiotics this admission (day 10 overall) MRSA PCR negative, vancomycin discontinued. Patient on azithromycin or levaquin since 12/22; no further atypical coverage needed at this time, azithromycin discontinued.  Continue aztreonam 1gm IV Q8H  Height: '5\' 4"'$  (162.6 cm) Weight: 166 lb 10.7 oz (75.6 kg) IBW/kg (Calculated) : 59.2  Temp (24hrs), Avg:97.8 F (36.6 C), Min:97.6 F (36.4 C), Max:98 F (36.7 C)   Recent Labs Lab 10/29/16 0832 10/29/16 1157 10/29/16 1514 10/30/16 0428 10/31/16 0413 11/01/16 0516 11/01/16 1225 11/02/16 0415  WBC 10.8*  --   --  12.5* 18.2* 15.0* 14.1*  --   CREATININE 2.58*  --   --  2.45* 2.46*  --  3.04* 3.48*  LATICACIDVEN  --  2.6* 4.2*  --   --   --   --   --     Estimated Creatinine Clearance: 17.3 mL/min (by C-G formula based on SCr of 3.48 mg/dL (H)).    Allergies  Allergen Reactions  . Cefprozil     REACTION: Swelling  . Penicillins Swelling    Has patient had a PCN reaction causing immediate rash, facial/tongue/throat swelling, SOB or lightheadedness with hypotension: Yes Has patient had a PCN reaction causing severe rash involving mucus membranes or skin necrosis: No Has patient had a PCN reaction that required hospitalization No Has patient had a PCN reaction occurring within the last 10 years: No If all of the above answers are "NO", then may proceed with Cephalosporin use.   Marland Kitchen Potassium Chloride     REACTION: SOB Patient has since tolerated    Antimicrobials this admission: 12/30 Aztreonam >>  12/30 Vancomcyin >> 1/2 12/30 azithromycin >>1/2  Dose adjustments this admission: 1/1 Increased aztreonam to 1 g IV q8h interval  Microbiology  results: BCx: NGTD  MRSA PCR: negative  Thank you for allowing pharmacy to be a part of this patient's care.  Cheri Guppy, PharmD, BCPS Clinical Pharmacist 11/02/2016 9:58 AM

## 2016-11-02 NOTE — NC FL2 (Signed)
[place] LEVEL OF CARE SCREENING TOOL     IDENTIFICATION  Patient Name: Edwin Moran Birthdate: 1941-09-09 Sex: male Admission Date (Current Location): 10/29/2016  [place] and [place] Number:  Engineering geologist and Address:  [facility], [address] [place], [place] [address]      Provider Number: [id number]  Attending Physician Name and Address:  Gladstone Lighter, MD  Relative Name and Phone Number:       Current Level of Care: Hospital Recommended Level of Care: [place] Prior Approval Number:    Date Approved/Denied:   PASRR Number:    Discharge Plan: SNF    Current Diagnoses: Patient Active Problem List   Diagnosis Date Noted  . Abdominal pain 10/29/2016  . Subcapsular hematoma of liver   . Pleural effusion 10/20/2016  . COPD exacerbation ([place]) 10/04/2016  . Hydronephrosis, Left 09/27/2016  . Chronic kidney disease, stage 3 09/27/2016  . Hematuria 08/28/2016  . Pulmonary nodule 08/28/2016  . PAD (peripheral artery disease) ([place]) 06/14/2016  . Advance care planning 10/07/2014  . SOB (shortness of breath) on exertion 01/02/2014  . Medicare annual wellness visit, subsequent 03/21/2013  . ARTHRITIS, CARPOMETACARPAL JOINT, BILATERAL 05/28/2009  . DISORDER, TOBACCO USE 05/11/2007  . Essential hypertension 05/11/2007  . ANEMIA, VITAMIN B12 DEFICIENCY 05/10/2007  . DIVERTICULOSIS, COLON 05/10/2007  . OSTEOARTHRITIS 05/10/2007    Orientation RESPIRATION BLADDER Height & Weight     Self, Time, Situation, Place  Normal, O2 (2) Continent Weight: 166 lb 10.7 oz (75.6 kg) Height:  '5\' 4"'$  (162.6 cm)  BEHAVIORAL SYMPTOMS/MOOD NEUROLOGICAL BOWEL NUTRITION STATUS   (no issues)  (none) Continent Diet (heart healthy)  AMBULATORY STATUS COMMUNICATION OF NEEDS Skin   Limited Assist Verbally Normal                       Personal Care Assistance Level of Assistance  Feeding   Feeding assistance:  Independent       Functional Limitations Info  Sight Sight Info: Impaired        SPECIAL CARE FACTORS FREQUENCY  PT (By licensed PT)                    Contractures Contractures Info: Not present    Additional Factors Info  Code Status, Allergies Code Status Info: full Allergies Info: cefporzil, pcns, potassium chloride           Current Medications (11/02/2016):  This is the current hospital active medication list Current Facility-Administered Medications  Medication Dose Route Frequency Provider Last Rate Last Dose  . acetaminophen (TYLENOL) tablet 650 mg  650 mg Oral Q6H PRN Henreitta Leber, MD       Or  . acetaminophen (TYLENOL) suppository 650 mg  650 mg Rectal Q6H PRN Henreitta Leber, MD      . albuterol (PROVENTIL) (2.5 MG/3ML) 0.083% nebulizer solution 2.5 mg  2.5 mg Nebulization QID Henreitta Leber, MD   2.5 mg at 11/02/16 1119  . albuterol (PROVENTIL) (2.5 MG/3ML) 0.083% nebulizer solution 3 mL  3 mL Inhalation Q6H PRN Henreitta Leber, MD      . alum & mag hydroxide-simeth (MAALOX/MYLANTA) 200-200-20 MG/5ML suspension 30 mL  30 mL Oral Q4H PRN Epifanio Lesches, MD   30 mL at 10/31/16 1257  . atorvastatin (LIPITOR) tablet 40 mg  40 mg Oral QPM Henreitta Leber, MD   40 mg at 11/01/16 1652  . aztreonam (AZACTAM) 1 g  in dextrose 5 % 50 mL IVPB  1 g Intravenous [address] [place], RPH   1 g at 11/02/16 8003  . bisacodyl (DULCOLAX) suppository 10 mg  10 mg Rectal Daily PRN Epifanio Lesches, MD   10 mg at 10/30/16 1503  . chlorproMAZINE (THORAZINE) tablet 10 mg  10 mg Oral TID Epifanio Lesches, MD   10 mg at 11/02/16 1029  . cholecalciferol (VITAMIN D) tablet 1,000 Units  1,000 Units Oral Daily Henreitta Leber, MD   1,000 Units at 11/02/16 1029  . docusate sodium (COLACE) capsule 100 mg  100 mg Oral BID Henreitta Leber, MD   100 mg at 11/02/16 1029  . HYDROcodone-acetaminophen (NORCO/VICODIN) 5-325 MG per tablet 1-2 tablet  1-2 tablet Oral Q6H PRN Henreitta Leber, MD   2 tablet at 11/02/16 1029  . levothyroxine (SYNTHROID, LEVOTHROID) tablet 50 mcg  50 mcg Oral QAC breakfast Henreitta Leber, MD   50 mcg at 11/02/16 0804  . MEDLINE mouth rinse  15 mL Mouth Rinse BID Henreitta Leber, MD   15 mL at 11/02/16 1000  . mometasone-formoterol (DULERA) 200-5 MCG/ACT inhaler 2 puff  2 puff Inhalation BID Henreitta Leber, MD   2 puff at 11/02/16 0804  . morphine 2 MG/ML injection 1 mg  1 mg Intravenous Q3H PRN Henreitta Leber, MD   1 mg at 10/29/16 1911  . nystatin (MYCOSTATIN) 100000 UNIT/ML suspension 500,000 Units  5 mL Oral QID Henreitta Leber, MD   500,000 Units at 11/02/16 1029  . ondansetron (ZOFRAN) tablet 4 mg  4 mg Oral Q6H PRN Henreitta Leber, MD       Or  . ondansetron (ZOFRAN) injection 4 mg  4 mg Intravenous Q6H PRN Henreitta Leber, MD      . pantoprazole (PROTONIX) EC tablet 40 mg  40 mg Oral Daily Epifanio Lesches, MD   40 mg at 11/02/16 1029  . potassium chloride SA (K-DUR,KLOR-CON) CR tablet 20 mEq  20 mEq Oral Daily Henreitta Leber, MD   20 mEq at 11/02/16 1029  . predniSONE (DELTASONE) tablet 40 mg  40 mg Oral Q breakfast Henreitta Leber, MD   40 mg at 11/02/16 0804  . sodium chloride flush (NS) 0.9 % injection 3 mL  3 mL Intravenous Q12H Epifanio Lesches, MD   3 mL at 11/02/16 1030  . sodium chloride flush (NS) 0.9 % injection 3 mL  3 mL Intravenous Q12H Epifanio Lesches, MD   3 mL at 11/02/16 1029     Discharge Medications: Please see discharge summary for a list of discharge medications.  Relevant Imaging Results:  Relevant Lab Results:   Additional Information ss: [id number]  Shela Leff, LCSW

## 2016-11-02 NOTE — Clinical Social Work Note (Signed)
Clinical Social Work Assessment  Patient Details  Name: Edwin Moran MRN: [id number] Date of Birth: October 06, 1941  Date of referral:  11/02/16               Reason for consult:  Facility Placement                Permission sought to share information with:  Facility Sport and exercise psychologist, Family Supports Permission granted to share information::  Yes, Verbal Permission Granted  Name::        Agency::     Relationship::     Contact Information:     Housing/Transportation Living arrangements for the past 2 months:  Single Family Home Source of Information:  Patient Patient Interpreter Needed:  None Criminal Activity/Legal Involvement Pertinent to Current Situation/Hospitalization:  No - Comment as needed Significant Relationships:  Adult Children Lives with:  Adult Children Do you feel safe going back to the place where you live?  Yes Need for family participation in patient care:  Yes (Comment)  Care giving concerns:  Patient lives with his daughter who works during the day.   Social Worker assessment / plan:  CSW informed that PT is recommending STR for patient. CSW met with patient this morning and explained role and purpose of visit. CSW asked patient if he had heard any more about being transferred to a tertiary facility. Patient stated the physician spoke with him about transferring if bed available and he was in agreement with this. CSW explained that if he does not transfer, CSW would assist with STR if this was something he was in agreement with. Patient stated that he knew that when all this was said and done, he was going to need rehab and was in agreement with STR. Patient wanted to make sure we had his daughter's number as well: Sharee Pimple:: [phone].   Employment status:  Retired Forensic scientist:  Medicare PT Recommendations:  [name] / Referral to community resources:     Patient/Family's Response to care:  Patient was very Patent attorney  for CSW visit.  Patient/Family's Understanding of and Emotional Response to Diagnosis, Current Treatment, and Prognosis: Patient is aware of his limitations and that he will need time to recover and is accepting of this.  Emotional Assessment Appearance:  Appears younger than stated age Attitude/Demeanor/Rapport:   (pleasant and cooperative) Affect (typically observed):  Accepting, Calm, Pleasant Orientation:  Oriented to Self, Oriented to Place, Oriented to  Time, Oriented to Situation Alcohol / Substance use:  Not Applicable Psych involvement (Current and /or in the community):  No (Comment)  Discharge Needs  Concerns to be addressed:  Care Coordination Readmission within the last 30 days:  No Current discharge risk:  None Barriers to Discharge:  No Barriers Identified   Shela Leff, LCSW 11/02/2016, 12:07 PM

## 2016-11-02 NOTE — Consult Note (Signed)
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  INTERVAL HISTORY: Patient is a 76 year old male who was recently diagnosed with stage IV adenocarcinoma of the lung who presented with increasing shortness of breath and abdominal pain. His hemoglobin decreased from greater than 11 down to 7 require blood transfusions. Patient was found to have a large subcapsular liver hematoma. Currently, patient feels improved but not back to his baseline. His hemoglobin continues to slowly trend down. He has no neurological point. He denies any recent fevers. He has a fair appetite, but denies weight loss. He denies any chest pain and currently does not complain of any shortness of breath. He denies any nausea, vomiting, constipation, or diarrhea. Patient otherwise feels well and offers no further specific complaints.   REVIEW OF SYSTEMS:   Review of Systems  Constitutional: Negative.  Negative for fever, malaise/fatigue and weight loss.  Respiratory: Negative for cough and shortness of breath.   Cardiovascular: Negative.  Negative for chest pain and leg swelling.  Gastrointestinal: Positive for abdominal pain. Negative for blood in stool and melena.  Genitourinary: Negative.   Musculoskeletal: Negative.   Neurological: Negative.  Negative for weakness.  Psychiatric/Behavioral: Negative.  The patient is not nervous/anxious.     As per HPI. Otherwise, a complete review of systems is negative.  PAST MEDICAL HISTORY: Past Medical History:  Diagnosis Date  . Cancer ([place])    skin  . Complication of anesthesia 1963   pt woke up during the procedure choking on drainage.  Marland Kitchen COPD (chronic obstructive pulmonary disease) ([place])   .  Diverticulosis of colon   . GERD (gastroesophageal reflux disease)   . History of GI diverticular bleed 03/20-03/31/04   [facility]  . History of hiatal hernia   . Hyperlipidemia   . Hypertension   . Osteoarthritis   . Peripheral vascular disease ([place])    right foot tunred purole  . PONV (postoperative nausea and vomiting)    at age 7 with finger surgery.  . Renal insufficiency   . Smoking     PAST SURGICAL HISTORY: Past Surgical History:  Procedure Laterality Date  . COLONOSCOPY    . CYSTOSCOPY  1991   due to prolonged prostatitis  . CYSTOSCOPY W/ RETROGRADES Bilateral 10/10/2016   Procedure: CYSTOSCOPY WITH RETROGRADE PYELOGRAM;  Surgeon: Hollice Espy, MD;  Location: [facility];  Service: Urology;  Laterality: Bilateral;  . CYSTOSCOPY WITH BIOPSY N/A 10/10/2016   Procedure: CYSTOSCOPY WITH BIOPSY;  Surgeon: Hollice Espy, MD;  Location: [facility];  Service: Urology;  Laterality: N/A;  . CYSTOSCOPY WITH STENT PLACEMENT Left 10/10/2016   Procedure: CYSTOSCOPY WITH STENT PLACEMENT;  Surgeon: Hollice Espy, MD;  Location: [facility];  Service: Urology;  Laterality: Left;  . 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 PERIPHERAL VASCULAR CATHETERIZATION Right 06/20/2016   Procedure: Lower Extremity Angiography;  Surgeon: Algernon Huxley, MD;  Location: [facility];  Service: Cardiovascular;  Laterality: Right;  . URETEROSCOPY Left 10/10/2016   Procedure: URETEROSCOPY;  Surgeon: Hollice Espy, MD;  Location: [facility];  Service: Urology;  Laterality: Left;  . WISDOM TOOTH EXTRACTION      FAMILY HISTORY: Family History  Problem Relation Age of Onset  . Asthma Mother  chronic, asthma attack caused MI  . Heart disease Mother     MI  . Thrombophlebitis Mother   . Hiatal hernia Mother   . Prostate cancer Neg Hx   . Colon cancer Neg Hx     ADVANCED DIRECTIVES (Y/N):  '@ADVDIR'$ @  HEALTH MAINTENANCE: Social  History  Substance Use Topics  . Smoking status: Former Smoker    Packs/day: 1.00    Years: 40.00    Quit date: 07/31/2016  . Smokeless tobacco: Never Used     Comment: off and on for 40 years, 1 PPD at first  . Alcohol use 12.6 - 16.8 oz/week    14 Standard drinks or equivalent, 7 - 14 Shots of liquor per week     Comment: couple of drinks a day     Colonoscopy:  PAP:  Bone density:  Lipid panel:  Allergies  Allergen Reactions  . Cefprozil     REACTION: Swelling  . Penicillins Swelling    Has patient had a PCN reaction causing immediate rash, facial/tongue/throat swelling, SOB or lightheadedness with hypotension: Yes Has patient had a PCN reaction causing severe rash involving mucus membranes or skin necrosis: No Has patient had a PCN reaction that required hospitalization No Has patient had a PCN reaction occurring within the last 10 years: No If all of the above answers are "NO", then may proceed with Cephalosporin use.   Marland Kitchen Potassium Chloride     REACTION: SOB Patient has since tolerated    Current Facility-Administered Medications  Medication Dose Route Frequency Provider Last Rate Last Dose  . acetaminophen (TYLENOL) tablet 650 mg  650 mg Oral Q6H PRN Henreitta Leber, MD       Or  . acetaminophen (TYLENOL) suppository 650 mg  650 mg Rectal Q6H PRN Henreitta Leber, MD      . albuterol (PROVENTIL) (2.5 MG/3ML) 0.083% nebulizer solution 2.5 mg  2.5 mg Nebulization QID Henreitta Leber, MD   2.5 mg at 11/02/16 1119  . albuterol (PROVENTIL) (2.5 MG/3ML) 0.083% nebulizer solution 3 mL  3 mL Inhalation Q6H PRN Henreitta Leber, MD      . alum & mag hydroxide-simeth (MAALOX/MYLANTA) 200-200-20 MG/5ML suspension 30 mL  30 mL Oral Q4H PRN Epifanio Lesches, MD   30 mL at 10/31/16 1257  . atorvastatin (LIPITOR) tablet 40 mg  40 mg Oral QPM Henreitta Leber, MD   40 mg at 11/01/16 1652  . aztreonam (AZACTAM) 1 g in dextrose 5 % 50 mL IVPB  1 g Intravenous [address] [place], RPH   1  g at 11/02/16 0604  . bisacodyl (DULCOLAX) suppository 10 mg  10 mg Rectal Daily PRN Epifanio Lesches, MD   10 mg at 10/30/16 1503  . chlorproMAZINE (THORAZINE) tablet 10 mg  10 mg Oral TID Epifanio Lesches, MD   10 mg at 11/02/16 1029  . cholecalciferol (VITAMIN D) tablet 1,000 Units  1,000 Units Oral Daily Henreitta Leber, MD   1,000 Units at 11/02/16 1029  . docusate sodium (COLACE) capsule 100 mg  100 mg Oral BID Henreitta Leber, MD   100 mg at 11/02/16 1029  . HYDROcodone-acetaminophen (NORCO/VICODIN) 5-325 MG per tablet 1-2 tablet  1-2 tablet Oral Q6H PRN Henreitta Leber, MD   2 tablet at 11/02/16 1029  . levothyroxine (SYNTHROID, LEVOTHROID) tablet 50 mcg  50 mcg Oral QAC breakfast Henreitta Leber, MD   50 mcg at 11/02/16 0804  . MEDLINE mouth rinse  15  mL Mouth Rinse BID Henreitta Leber, MD   15 mL at 11/02/16 1000  . mometasone-formoterol (DULERA) 200-5 MCG/ACT inhaler 2 puff  2 puff Inhalation BID Henreitta Leber, MD   2 puff at 11/02/16 0804  . morphine 2 MG/ML injection 1 mg  1 mg Intravenous Q3H PRN Henreitta Leber, MD   1 mg at 10/29/16 1911  . nystatin (MYCOSTATIN) 100000 UNIT/ML suspension 500,000 Units  5 mL Oral QID Henreitta Leber, MD   500,000 Units at 11/02/16 1029  . ondansetron (ZOFRAN) tablet 4 mg  4 mg Oral Q6H PRN Henreitta Leber, MD       Or  . ondansetron (ZOFRAN) injection 4 mg  4 mg Intravenous Q6H PRN Henreitta Leber, MD      . pantoprazole (PROTONIX) EC tablet 40 mg  40 mg Oral Daily Epifanio Lesches, MD   40 mg at 11/02/16 1029  . potassium chloride SA (K-DUR,KLOR-CON) CR tablet 20 mEq  20 mEq Oral Daily Henreitta Leber, MD   20 mEq at 11/02/16 1029  . predniSONE (DELTASONE) tablet 40 mg  40 mg Oral Q breakfast Henreitta Leber, MD   40 mg at 11/02/16 0804  . sodium chloride flush (NS) 0.9 % injection 3 mL  3 mL Intravenous Q12H Epifanio Lesches, MD   3 mL at 11/02/16 1030  . sodium chloride flush (NS) 0.9 % injection 3 mL  3 mL Intravenous Q12H  Epifanio Lesches, MD   3 mL at 11/02/16 1029    OBJECTIVE: Vitals:   11/01/16 2020 11/02/16 0515  BP: 119/61 123/70  Pulse: 95 89  Resp: 18 18  Temp: 97.6 F (36.4 C) 97.8 F (36.6 C)     Body mass index is 28.61 kg/m.    ECOG FS:1 - Symptomatic but completely ambulatory  General: Well-developed, well-nourished, no acute distress. Eyes: Pink conjunctiva, anicteric sclera. HEENT: Normocephalic, moist mucous membranes, clear oropharnyx. Lungs: Clear to auscultation bilaterally. Heart: Regular rate and rhythm. No rubs, murmurs, or gallops. Abdomen: Soft, nontender, nondistended. No organomegaly noted, normoactive bowel sounds. Musculoskeletal: No edema, cyanosis, or clubbing. Neuro: Alert, answering all questions appropriately. Cranial nerves grossly intact. Skin: No rashes or petechiae noted. Psych: Normal affect. Lymphatics: No cervical, calvicular, axillary or inguinal LAD.   LAB RESULTS:  Lab Results  Component Value Date   NA 132 (L) 11/01/2016   K 4.6 11/01/2016   CL 104 11/01/2016   CO2 20 (L) 11/01/2016   GLUCOSE 136 (H) 11/01/2016   BUN 64 (H) 11/01/2016   CREATININE 3.48 (H) 11/02/2016   CALCIUM 7.5 (L) 11/01/2016   PROT 5.7 (L) 11/01/2016   ALBUMIN 2.6 (L) 11/01/2016   AST 64 (H) 11/01/2016   ALT 327 (H) 11/01/2016   ALKPHOS 123 11/01/2016   BILITOT 1.0 11/01/2016   GFRNONAA 16 (L) 11/02/2016   GFRAA 18 (L) 11/02/2016    Lab Results  Component Value Date   WBC 14.1 (H) 11/01/2016   NEUTROABS 8.0 (H) 10/20/2016   HGB 8.4 (L) 11/01/2016   HCT 24.5 (L) 11/01/2016   MCV 90.3 11/01/2016   PLT 127 (L) 11/01/2016     STUDIES: Ct Abdomen Pelvis Wo Contrast  Result Date: 11/01/2016 CLINICAL DATA:  Generalized abdominal pain secondary to a large subcutaneous hemorrhage on the liver causing anemia EXAM: CT ABDOMEN AND PELVIS WITHOUT CONTRAST TECHNIQUE: Multidetector CT imaging of the abdomen and pelvis was performed following the standard protocol  without IV contrast. COMPARISON:  10/29/2016, 09/26/2016, 08/27/2016  FINDINGS: Lower chest: Mild interstitial thickening within the right middle lobe and right lower lobe, could reflect edema. Worsened airspace disease in the right lower lobe may represent a pneumonia. Persistent moderate right-sided pleural effusion with loculation along the posterior right lower lobe. Mild left lower lobe infiltrate. Small left pleural effusion. Coronary artery calcifications. Trace pericardial effusion or thickening. Normal heart size. Hepatobiliary: The liver is markedly abnormal in appearance. Interval increase in size of large subcapsular hematoma with new the matted crit levels and increased density within subcapsular fluid consistent with interval hemorrhage. When measured in a similar fashion compared to the previous exam, the hematoma measures 15 x 5.8 by 12.2 cm, compared with 14 x 5 x 9 cm previously. Suspect that this is an underestimation of the true size of the hematoma hematoma is suspected to be isodense to liver parenchyma posteriorly due to interval hemorrhage. The posterior right hepatic lobe parenchyma is heterogenous. Focal hypodense lesion within the posterior right hepatic lobe with central increased density, measuring 4.9 cm, series 2, image number 22 increased in size and possibly representing additional hematoma. No biliary dilatation. Calcifications in the porta hepatis are again noted. Gallbladder shows no wall thickening. Pancreas: Unremarkable. No pancreatic ductal dilatation or surrounding inflammatory changes. Spleen: Normal in size without focal abnormality. Adrenals/Urinary Tract: Marked nodular enlargement of the bilateral adrenal glands as before. Multiple hypodense probable cysts within the kidneys. Mild right hydronephrosis and hydroureter but without obstructing calculus. Multiple calcifications within the bilateral kidneys could reflect small stones or vascular calcification. Left-sided  ureteral stent is in place. There is no change in residual mild to moderate hydronephrosis and hydroureter of the left kidney. A Foley catheter is present in the bladder. There is a small amount of gas within the anterior aspect of the bladder. Stomach/Bowel: The stomach is nonenlarged. There is no dilated small bowel. 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 Vascular/Lymphatic: Dense atherosclerotic calcification of the aorta. Focally ectatic distal abdominal aorta up to 2.6 cm as before. Stable retroperitoneal nodes. Reproductive: No mass visualized. Other: Small amount of free fluid around the spleen and inferior aspect of the liver. No free air. Musculoskeletal: No suspicious bone lesions. Advanced degenerative changes of the lumbar spine. There is a soft tissue mass posterior to the left iliopsoas muscle, series 2, image number 49, this measures 3.4 cm oblique axial by 5.2 cm oblique craniocaudad, this is stable compared with 10/29/2016 but new compared with 08/27/2016. IMPRESSION: 1. Markedly abnormal appearance of the liver. Interval increase in size of large subcapsular hematoma, measuring at least 15 cm, suspect that this is an underestimation of the size of the hematoma. New hematocrit levels and increased attenuation within the hematoma is compatible with interval bleeding. Suspect that the hematoma extends posteriorly over the dome of the liver but hematoma is difficult to separate from underlying liver parenchyma due to interval hemorrhage. Additional 4.9 cm subcapsular hypodensity with central increased density posterior right hepatic lobe, increased, could reflect additional hematoma. 2. Small left pleural effusion and persistent small moderate right pleural effusion. Increased airspace disease in the right lower lobe suggests a pneumonia. 3. New small amount of fluid around the liver and spleen. 4. Marked nodular enlargement of the adrenal glands as before 5.  Residual moderate hydronephrosis of left kidney with ureteral stent in place. No change in mild right hydronephrosis and hydroureter. 6. 5.2 cm tissue mass within the left retroperitoneum, posterior to the ileus psoas muscle. Electronically Signed   By: Maudie Mercury  Francoise Ceo M.D.   On: 11/01/2016 22:00   Ct Abdomen Pelvis Wo Contrast  Result Date: 10/29/2016 CLINICAL DATA:  Back pain, pleuritic chest pain, shortness of breath, abdominal pain and hypotension. Status post recent left ureteral stent placement for hydronephrosis due to distal left ureteral obstruction. EXAM: CT CHEST, ABDOMEN AND PELVIS WITHOUT CONTRAST TECHNIQUE: Multidetector CT imaging of the chest, abdomen and pelvis was performed following the standard protocol without IV contrast. COMPARISON:  CTA of the chest on 10/20/2016 CT of the abdomen and pelvis on 09/26/2016 FINDINGS: CT CHEST FINDINGS Cardiovascular: The heart size is normal. Coronary atherosclerosis is present with diffusely calcified plaque in a 3 vessel distribution. Trace pericardial fluid. Mediastinum/Nodes: Stable mildly prominent AP window and prevascular mediastinal lymph nodes. Stable mildly prominent right axillary lymph nodes. Lungs/Pleura: Combination of multifocal nodules and areas of parenchymal airspace opacity are again noted in both lungs. Some of these findings likely represent some continued component of heart failure with persistent, but smaller, right pleural effusion present. There are some concerning areas of nodularity including a 1.5 x 2.3 cm central area of spiculated nodularity in the left upper lobe adjacent to the aortic arch, 5 mm left upper lobe nodule, 7 mm lingular nodule, 1.3 cm left lower lobe nodule abutting the major fissure and persistent 1 cm right lower lobe nodule also abutting the major fissure. Patchy areas of ground-glass and airspace opacity are present in both upper and lower lung zones and a more geographic area of airspace disease and bronchial  thickening is present through much of the lower aspect of the right lower lobe. Musculoskeletal: No bony abnormalities in the chest. CT ABDOMEN PELVIS FINDINGS Hepatobiliary: Dominant new finding since recent 12/21 CT is a new, large subcapsular fluid collection along the anterior and superior margin of the right lobe of the liver measuring up to approximately 14 x 5 x 9 cm and associated with visible anterior capsular disruption and exerting mass effect on the liver. Internal fluid density is roughly 20 Hounsfield units and acute onset and appearance are consistent with spontaneous subcapsular hemorrhage. This does appear to be largely confined within the capsule as no significant intraperitoneal hemorrhage is identified. The gallbladder is unremarkable. No biliary dilatation. Pancreas: Unenhanced appearance of the pancreas is stable and unremarkable. Spleen: The spleen remains normal in size. There is a small amount of free fluid around the spleen. Adrenals/Urinary Tract: Adrenal gland shows stable hyperplastic appearance bilaterally. Left ureteral stent extends from an upper pole infundibulum down to the level of the bladder. No significant left hydronephrosis. The right kidney shows nonobstructing calculi. Stomach/Bowel: No evidence of bowel obstruction, free air or bowel inflammation. Stable diverticulosis of the colon. Vascular/Lymphatic: No enlarged lymph nodes are identified in the abdomen or pelvis. Stable atherosclerosis of the abdominal aorta and iliac arteries without evidence of aneurysm. Other: No abdominal wall hernias. 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 IMPRESSION: 1. 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 There also may be a component of chronic heart failure and pneumonia. A right pleural effusion shows decrease in volume since 12/21. 2. New large subcapsular fluid collection with visible  invagination into the liver parenchyma and mass effect on the liver consistent with spontaneous subcapsular hemorrhage. This is new since 12/21. 3. These results were called by telephone at the time of interpretation on 10/29/2016 at 3:36 pm to Dr. Lisa Roca , who verbally acknowledged these results. Electronically Signed   By: Eulas Post  Kathlene Cote M.D.   On: 10/29/2016 15:37   Dg Chest 1 View  Result Date: 10/24/2016 CLINICAL DATA:  Wheezing.  Hyperlipidemia.  Hypertension. EXAM: CHEST 1 VIEW COMPARISON:  10/20/2016 FINDINGS: Midline trachea. Mild cardiomegaly. Small right pleural effusion persists. No pneumothorax. Right greater than left interstitial edema is minimally improved on the left. Fluid tracking in the right minor fissure. IMPRESSION: Minimal improvement in congestive heart failure with persistent small right pleural effusion. Electronically Signed   By: Abigail Miyamoto M.D.   On: 10/24/2016 08:17   Dg Chest 1 View  Result Date: 10/20/2016 CLINICAL DATA:  Status post right-sided thoracentesis. EXAM: CHEST 1 VIEW COMPARISON:  CT scan of the chest of today's date FINDINGS: The volume of pleural fluid on the right has decreased significantly since the thoracentesis. There is no postprocedure pneumothorax. Both lungs are reasonably well expanded. The interstitial markings remain increased. There is confluent density in the left upper lobe consistent with pneumonia. The heart is normal in size. The pulmonary vascularity is not engorged. The mediastinum is normal in width. The trachea is midline. The bony thorax exhibits no acute abnormality. IMPRESSION: No postprocedure pneumothorax following right-sided thoracentesis. Electronically Signed   By: David  Martinique M.D.   On: 10/20/2016 16:43   Dg Chest 2 View  Result Date: 10/04/2016 CLINICAL DATA:  Wheezing. EXAM: CHEST  2 VIEW COMPARISON:  CT 09/26/2016.  Chest x-ray 03/10/ 2015. FINDINGS: Mediastinum and hilar structures are normal. Borderline  cardiomegaly. Diffuse bilateral from interstitial prominence noted consistent with interstitial edema and/or pneumonitis. Small bilateral pleural effusions noted. Previously identified right base pulmonary nodule not identified due to overlying acute interstitial process. No acute bony abnormality . IMPRESSION: 1. Mild cardiomegaly. Mild bilateral from interstitial prominence consist with interstitial edema and/or pneumonitis. Small bilateral pleural effusions. 2. Previously identified small stable pulmonary nodule in the right lung base is not identified due to overlying acute interstitial process. Electronically Signed   By: Marcello Moores  Register   On: 10/04/2016 11:01   Ct Chest Wo Contrast  Result Date: 10/29/2016 CLINICAL DATA:  Back pain, pleuritic chest pain, shortness of breath, abdominal pain and hypotension. Status post recent left ureteral stent placement for hydronephrosis due to distal left ureteral obstruction. EXAM: CT CHEST, ABDOMEN AND PELVIS WITHOUT CONTRAST TECHNIQUE: Multidetector CT imaging of the chest, abdomen and pelvis was performed following the standard protocol without IV contrast. COMPARISON:  CTA of the chest on 10/20/2016 CT of the abdomen and pelvis on 09/26/2016 FINDINGS: CT CHEST FINDINGS Cardiovascular: The heart size is normal. Coronary atherosclerosis is present with diffusely calcified plaque in a 3 vessel distribution. Trace pericardial fluid. Mediastinum/Nodes: Stable mildly prominent AP window and prevascular mediastinal lymph nodes. Stable mildly prominent right axillary lymph nodes. Lungs/Pleura: Combination of multifocal nodules and areas of parenchymal airspace opacity are again noted in both lungs. Some of these findings likely represent some continued component of heart failure with persistent, but smaller, right pleural effusion present. There are some concerning areas of nodularity including a 1.5 x 2.3 cm central area of spiculated nodularity in the left upper lobe  adjacent to the aortic arch, 5 mm left upper lobe nodule, 7 mm lingular nodule, 1.3 cm left lower lobe nodule abutting the major fissure and persistent 1 cm right lower lobe nodule also abutting the major fissure. Patchy areas of ground-glass and airspace opacity are present in both upper and lower lung zones and a more geographic area of airspace disease and bronchial thickening is present through much of the  lower aspect of the right lower lobe. Musculoskeletal: No bony abnormalities in the chest. CT ABDOMEN PELVIS FINDINGS Hepatobiliary: Dominant new finding since recent 12/21 CT is a new, large subcapsular fluid collection along the anterior and superior margin of the right lobe of the liver measuring up to approximately 14 x 5 x 9 cm and associated with visible anterior capsular disruption and exerting mass effect on the liver. Internal fluid density is roughly 20 Hounsfield units and acute onset and appearance are consistent with spontaneous subcapsular hemorrhage. This does appear to be largely confined within the capsule as no significant intraperitoneal hemorrhage is identified. The gallbladder is unremarkable. No biliary dilatation. Pancreas: Unenhanced appearance of the pancreas is stable and unremarkable. Spleen: The spleen remains normal in size. There is a small amount of free fluid around the spleen. Adrenals/Urinary Tract: Adrenal gland shows stable hyperplastic appearance bilaterally. Left ureteral stent extends from an upper pole infundibulum down to the level of the bladder. No significant left hydronephrosis. The right kidney shows nonobstructing calculi. Stomach/Bowel: No evidence of bowel obstruction, free air or bowel inflammation. Stable diverticulosis of the colon. Vascular/Lymphatic: No enlarged lymph nodes are identified in the abdomen or pelvis. Stable atherosclerosis of the abdominal aorta and iliac arteries without evidence of aneurysm. Other: No abdominal wall hernias. Musculoskeletal:  Stable degenerative disc disease of the lumbar spine. IMPRESSION: 1. 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 There also may be a component of chronic heart failure and pneumonia. A right pleural effusion shows decrease in volume since 12/21. 2. New large subcapsular fluid collection with visible invagination into the liver parenchyma and mass effect on the liver consistent with spontaneous subcapsular hemorrhage. This is new since 12/21. 3. These results were called by telephone at the time of interpretation on 10/29/2016 at 3:36 pm to Dr. Lisa Roca , who verbally acknowledged these results. Electronically Signed   By: Aletta Edouard M.D.   On: 10/29/2016 15:37   Ct Angio Chest Pe W And/or Wo Contrast  Result Date: 10/20/2016 CLINICAL DATA:  Shortness of Breath EXAM: CT ANGIOGRAPHY CHEST WITH CONTRAST TECHNIQUE: Multidetector CT imaging of the chest was performed using the standard protocol during bolus administration of intravenous contrast. Multiplanar CT image reconstructions and MIPs were obtained to evaluate the vascular anatomy. CONTRAST:  75 mL Isovue 370 nonionic COMPARISON:  Chest radiograph October 04, 2016 and CT abdomen and pelvis September 26, 2016 FINDINGS: Cardiovascular: There is no demonstrable pulmonary embolus. There is no appreciable thoracic aortic aneurysm or dissection. There is moderate atherosclerotic calcification at the origins of the great vessels. There is moderate atherosclerotic calcification in the aorta. There is peripheral thrombus in portions of the descending thoracic aorta. There are multiple foci of coronary artery calcification. The pericardium is not appreciably thickened. Mediastinum/Nodes: Thyroid appears unremarkable. There are multiple subcentimeter lymph nodes in the mediastinum. In the right axillary region, there is no adenopathy. However, there is soft tissue stranding in the right axillary  region. There are mildly prominent lymph nodes in the aortopulmonary window region, largest measuring 1.5 x 1.0 cm. There is an enlarged lymph node immediately anterior to the carina on the right measuring 2.0 x 1.4 cm. There is a sub- carinal lymph node measuring 1.6 x 1.2 cm. Lungs/Pleura: There is a sizable free-flowing pleural effusion on the right. There is lower lobe region interstitial edema bilaterally. There are multiple foci of airspace consolidation bilaterally including in both lower lobes, primarily in the posterior  segment left lower lobe, in the posterior segments of both upper lobes, in the anterior segment right upper lobe, and in both perihilar regions, more pronounced on the left than on the right. Previously noted 1 x 1 cm nodular opacity in the anterior segment of the right lower lobe is seen on axial slice 70 series 6, partially obscured by adjacent fluid and edema. Upper Abdomen: In the visualized upper abdomen, there is diffuse adrenal hypertrophy, stable. There is a cyst arising from the posterior upper pole right kidney measuring 2.7 x 2.1 cm. There is incomplete visualization of a cyst in the upper pole of the left kidney measuring 2.4 x 2.1 cm. There are multiple foci of atherosclerotic calcification in the aorta and major visualized mesenteric arteries. There is incomplete visualization of a 3 mm calculus in the upper pole of the right kidney. Liver contour raises concern for a degree of underlying hepatic cirrhosis. Musculoskeletal: There is degenerative change in the thoracic spine. There are no blastic or lytic bone lesions. Review of the MIP images confirms the above findings. IMPRESSION: No demonstrable pulmonary embolus. Evidence of a degree of congestive heart failure. Note that there is interstitial edema and fairly sizable right pleural effusion. Multiple areas of alveolar opacity are felt to be most likely due to multifocal pneumonia, although some of this opacity may represent  alveolar edema. Both edema and pneumonia can present concurrently. Previously noted 1 x 1 cm nodular opacity in the anterior segment right lower lobe persists. Small neoplasm in this area must be of concern. This finding may well warrant nuclear medicine PET study to further evaluate given its persistence and size. Several prominent lymph nodes. Question reactive versus neoplastic etiology. There is soft tissue stranding in the right axilla with several small lymph nodes in this area. 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 Areas of atherosclerotic calcification in the aorta. Multiple foci of coronary artery calcification evident. Stable bilateral adrenal hypertrophy. Nonobstructing calculus upper pole right kidney. Contour of liver suggests a degree of underlying cirrhosis. Electronically Signed   By: Lowella Grip III M.D.   On: 10/20/2016 13:21   Mr Jeri Cos RS Contrast  Result Date: 10/22/2016 CLINICAL DATA:  76 year old hypertensive male with hyperlipidemia presenting with weakness and fatigue. Recent diagnosis of lung cancer. Initial encounter. EXAM: MRI HEAD WITHOUT AND WITH CONTRAST TECHNIQUE: Multiplanar, multiecho pulse sequences of the brain and surrounding structures were obtained without and with intravenous contrast. CONTRAST:  34m MULTIHANCE GADOBENATE DIMEGLUMINE 529 MG/ML IV SOLN COMPARISON:  None. 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 No intracranial hemorrhage. No intracranial enhancing lesion or bony destructive lesion to suggest presence of intracranial metastatic disease. Mild to moderate chronic microvascular changes. Mild moderate global atrophy without hydrocephalus. Vascular: Major intracranial vascular structures are patent. 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 Sinuses/Orbits: Post lens replacement without acute orbital abnormality.  Air-fluid level left maxillary sinus may indicate acute sinusitis. Diminutive size right maxillary sinus. Mild mucosal thickening ethmoid sinus air cells and left frontal sinus. Other: Possible right upper neck cystic adenopathy. CT of the neck recommended for further delineation. 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 No evidence of intracranial metastatic disease. Mild to moderate chronic microvascular changes. Mild to moderate global atrophy without hydrocephalus. Possible right upper neck cystic adenopathy. CT of the neck recommended for further delineation. 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 This can be further assessed with cervical spine MR if clinically desired. Air-fluid level left maxillary sinus may indicate acute sinusitis. Diminutive size right maxillary sinus. Mild mucosal thickening ethmoid sinus air cells and left frontal sinus. Electronically Signed   By: Genia Del M.D.   On: 10/22/2016 18:39   US Carotid Bilateral  Result Date: 10/24/2016 CLINICAL DATA:  Small acute right occipital punctate infarct EXAM: BILATERAL CAROTID DUPLEX ULTRASOUND TECHNIQUE: Pearline Cables scale imaging, color Doppler and duplex ultrasound were performed of bilateral carotid and vertebral arteries in the neck. COMPARISON:  10/22/2016 FINDINGS: Criteria: Quantification of carotid stenosis is based on velocity parameters that correlate the residual internal carotid diameter with NASCET-based stenosis levels, using the diameter of the distal internal carotid lumen as the denominator for stenosis measurement. The following velocity measurements were obtained: RIGHT ICA:  124/38 cm/sec CCA:  35/36 cm/sec SYSTOLIC ICA/CCA RATIO:  1.8 DIASTOLIC ICA/CCA RATIO:  3.2 ECA:  113 cm/sec LEFT ICA:  80/19 cm/sec CCA:  14/4 cm/sec SYSTOLIC ICA/CCA RATIO:  1.2 DIASTOLIC ICA/CCA RATIO:  2.3 ECA:  86 cm/sec RIGHT CAROTID ARTERY: Mild echogenic  shadowing plaque formation. No hemodynamically significant right ICA stenosis, velocity elevation, or turbulent flow. Degree of narrowing less than 50%. RIGHT VERTEBRAL ARTERY:  Antegrade LEFT CAROTID ARTERY: Similar scattered mild echogenic plaque formation. No hemodynamically significant left ICA stenosis, velocity elevation, or turbulent flow. LEFT VERTEBRAL ARTERY:  Antegrade IMPRESSION: Mild carotid atherosclerosis. No hemodynamically significant ICA stenosis. Degree of narrowing less than 50% bilaterally. Patent antegrade vertebral flow bilaterally Electronically Signed   By: Jerilynn Mages.  Shick M.D.   On: 10/24/2016 10:38   Dg Chest Port 1 View  Result Date: 10/29/2016 CLINICAL DATA:  Recently discharged for hospital for fluid on lungs. Thoracentesis done 10-20-16. Now with increasing SOB. EXAM: PORTABLE CHEST 1 VIEW COMPARISON:  10/24/2016 FINDINGS: Cardiac silhouette is normal in size. No convincing mediastinal masses. Mild hazy perihilar and lower lung zone interstitial airspace opacities are noted, most evident at the right base. Small right pleural effusion. These findings appear mildly improved from the most recent prior exam. Upper lungs are clear. No pneumothorax. Skeletal structures are grossly intact. IMPRESSION: 1. Perihilar and right greater than left lower lung zone opacity with a small right pleural effusion. Findings may reflect asymmetric pulmonary edema. Consider multifocal pneumonia if there are consistent clinical symptoms. Overall appearance of the lungs is mildly improved when compared to most recent prior exam. Electronically Signed   By: Lajean Manes M.D.   On: 10/29/2016 10:31   US Thoracentesis Asp Pleural Space W/img Guide  Result Date: 10/20/2016 INDICATION: Shortness of breath and right pleural effusion. EXAM: ULTRASOUND GUIDED RIGHT THORACENTESIS MEDICATIONS: None. COMPLICATIONS: None immediate. PROCEDURE: An ultrasound guided thoracentesis was thoroughly discussed with the  patient and questions answered. The benefits, risks, alternatives and complications were also discussed. The patient understands and wishes to proceed with the procedure. Written consent was obtained. Ultrasound was performed to localize and mark an adequate pocket of fluid in the right chest. The area was then prepped and draped in the normal sterile fashion. 1% Lidocaine was used for local anesthesia. Under ultrasound guidance a 6 Fr Safe-T-Centesis catheter was introduced. Thoracentesis was performed. The catheter was removed and a dressing applied. FINDINGS: A total of approximately 800 mL of amber color fluid was removed. Samples were sent to the laboratory as requested by the clinical team. IMPRESSION: Successful ultrasound guided right thoracentesis yielding 800 mL of pleural fluid. Electronically Signed  By: Markus Daft M.D.   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  PLAN:    1. Stage IV lung cancer: Previously, cytology from thoracentesis confirmed adenocarcinoma of lung origin. Patient was unable to make his outpatient appointment for further evaluation secondary to his subcapsular hematoma. He will require a PET scan as an outpatient to complete the staging workup. Patient should follow-up in the [facility] in 1-2 weeks after discharge for further evaluation. 2. Subcapsular liver hematoma: Unclear etiology. Appreciate surgical input. Patient's hemoglobin continues to trend down. Monitor and transfuse as needed. CT results reviewed independently and reported as above. 3. Urinary retention: Foley catheter in place. Appreciate urology input.  Appreciate consult, will follow.  Lloyd Huger, MD   11/02/2016 12:07 PM

## 2016-11-02 NOTE — Progress Notes (Addendum)
11/02/2016  Subjective: 76 year old male with recent diagnosis of stage IV adenocarcinoma of the lung were presented with worsening shortness of breath and abdominal pain. The scan showed on 12/30 that he had a large subcapsular liver hematoma. Repeat CT scan on 1/2 due to decreasing hemoglobin showed a larger subcapsular hematoma now also extending posteriorly. Patient reports only minimal abdominal pain on the right upper quadrant and had no acute events overnight  Vital signs: Temp:  [97.6 F (36.4 C)-98 F (36.7 C)] 97.8 F (36.6 C) (01/03 0515) Pulse Rate:  [89-98] 89 (01/03 0515) Resp:  [16-18] 18 (01/03 0515) BP: (119-123)/(61-72) 123/70 (01/03 0515) SpO2:  [94 %-97 %] 97 % (01/03 1119) FiO2 (%):  [28 %] 28 % (01/03 1119)   Intake/Output: 01/02 0701 - 01/03 0700 In: 1510 [P.O.:1460; IV Piggyback:50] Out: 800 [Urine:800] Last BM Date: 10/30/16  Physical Exam: Constitutional: No acute distress Abdomen:  Soft, nondistended, with mild tenderness to palpation in the right upper quadrant  Labs:   Recent Labs  11/01/16 0516 11/01/16 1225  WBC 15.0* 14.1*  HGB 8.7* 8.4*  HCT 25.3* 24.5*  PLT 124* 127*    Recent Labs  10/31/16 0413 11/01/16 1225 11/02/16 0415  NA 136 132*  --   K 3.8 4.6  --   CL 105 104  --   CO2 21* 20*  --   GLUCOSE 111* 136*  --   BUN 56* 64*  --   CREATININE 2.46* 3.04* 3.48*  CALCIUM 7.9* 7.5*  --    No results for input(s): LABPROT, INR in the last 72 hours.  Imaging: Ct Abdomen Pelvis Wo Contrast  Result Date: 11/01/2016 CLINICAL DATA:  Generalized abdominal pain secondary to a large subcutaneous hemorrhage on the liver causing anemia EXAM: CT ABDOMEN AND PELVIS WITHOUT CONTRAST TECHNIQUE: Multidetector CT imaging of the abdomen and pelvis was performed following the standard protocol without IV contrast. COMPARISON:  10/29/2016, 09/26/2016, 08/27/2016 FINDINGS: Lower chest: Mild interstitial thickening within the right middle lobe and  right lower lobe, could reflect edema. Worsened airspace disease in the right lower lobe may represent a pneumonia. Persistent moderate right-sided pleural effusion with loculation along the posterior right lower lobe. Mild left lower lobe infiltrate. Small left pleural effusion. Coronary artery calcifications. Trace pericardial effusion or thickening. Normal heart size. Hepatobiliary: The liver is markedly abnormal in appearance. Interval increase in size of large subcapsular hematoma with new the matted crit levels and increased density within subcapsular fluid consistent with interval hemorrhage. When measured in a similar fashion compared to the previous exam, the hematoma measures 15 x 5.8 by 12.2 cm, compared with 14 x 5 x 9 cm previously. Suspect that this is an underestimation of the true size of the hematoma hematoma is suspected to be isodense to liver parenchyma posteriorly due to interval hemorrhage. The posterior right hepatic lobe parenchyma is heterogenous. Focal hypodense lesion within the posterior right hepatic lobe with central increased density, measuring 4.9 cm, series 2, image number 22 increased in size and possibly representing additional hematoma. No biliary dilatation. Calcifications in the porta hepatis are again noted. Gallbladder shows no wall thickening. Pancreas: Unremarkable. No pancreatic ductal dilatation or surrounding inflammatory changes. Spleen: Normal in size without focal abnormality. Adrenals/Urinary Tract: Marked nodular enlargement of the bilateral adrenal glands as before. Multiple hypodense probable cysts within the kidneys. Mild right hydronephrosis and hydroureter but without obstructing calculus. Multiple calcifications within the bilateral kidneys could reflect small stones or vascular calcification. Left-sided ureteral stent is in  place. There is no change in residual mild to moderate hydronephrosis and hydroureter of the left kidney. A Foley catheter is present in  the bladder. There is a small amount of gas within the anterior aspect of the bladder. Stomach/Bowel: The stomach is nonenlarged. There is no dilated small bowel. 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 Vascular/Lymphatic: Dense atherosclerotic calcification of the aorta. Focally ectatic distal abdominal aorta up to 2.6 cm as before. Stable retroperitoneal nodes. Reproductive: No mass visualized. Other: Small amount of free fluid around the spleen and inferior aspect of the liver. No free air. Musculoskeletal: No suspicious bone lesions. Advanced degenerative changes of the lumbar spine. There is a soft tissue mass posterior to the left iliopsoas muscle, series 2, image number 49, this measures 3.4 cm oblique axial by 5.2 cm oblique craniocaudad, this is stable compared with 10/29/2016 but new compared with 08/27/2016. IMPRESSION: 1. Markedly abnormal appearance of the liver. Interval increase in size of large subcapsular hematoma, measuring at least 15 cm, suspect that this is an underestimation of the size of the hematoma. New hematocrit levels and increased attenuation within the hematoma is compatible with interval bleeding. Suspect that the hematoma extends posteriorly over the dome of the liver but hematoma is difficult to separate from underlying liver parenchyma due to interval hemorrhage. Additional 4.9 cm subcapsular hypodensity with central increased density posterior right hepatic lobe, increased, could reflect additional hematoma. 2. Small left pleural effusion and persistent small moderate right pleural effusion. Increased airspace disease in the right lower lobe suggests a pneumonia. 3. New small amount of fluid around the liver and spleen. 4. Marked nodular enlargement of the adrenal glands as before 5. Residual moderate hydronephrosis of left kidney with ureteral stent in place. No change in mild right hydronephrosis and hydroureter. 6. 5.2 cm tissue mass within  the left retroperitoneum, posterior to the ileus psoas muscle. Electronically Signed   By: Donavan Foil M.D.   On: 11/01/2016 22:00    Assessment/Plan: 76 year old male with stage IV lung cancer with an enlarging subcapsular hematoma.  -Due to the increasing size of the hematoma with a decreasing hemoglobin there is higher suspicion for ongoing bleeding into this hematoma. He may require embolization with vascular surgery. -At this point the patient does not have any emergent need for surgical intervention. However if he were to have an acute worsening that would require surgical exploration, at this facility were not appropriately equipped to handle such procedure and manipulation and laser coagulation of the liver. As such would recommend that the patient be transferred to a tertiary care center in case the patient requires acute surgical intervention, particularly considering the patient's other comorbidities.. At such facility he may also have embolization if needed. -Have discussed this with the patient and the patient understands and all of his questions have been answered.   Melvyn Neth, [place]

## 2016-11-03 LAB — CULTURE, BLOOD (ROUTINE X 2)
CULTURE: NO GROWTH
Culture: NO GROWTH

## 2016-11-07 ENCOUNTER — Inpatient Hospital Stay: Payer: Medicare Other | Admitting: Oncology

## 2016-11-08 ENCOUNTER — Ambulatory Visit: Payer: Medicare Other | Admitting: Family Medicine

## 2016-11-09 ENCOUNTER — Encounter: Payer: Self-pay | Admitting: Family Medicine

## 2016-11-09 DIAGNOSIS — Z515 Encounter for palliative care: Secondary | ICD-10-CM | POA: Insufficient documentation

## 2016-11-10 ENCOUNTER — Telehealth: Payer: Self-pay | Admitting: Family Medicine

## 2016-11-10 NOTE — Telephone Encounter (Signed)
Called his daughter, offered condolences.  She thanked me for the call.  I was always glad to see this pleasant man in clinic.  He will be missed.

## 2016-11-15 ENCOUNTER — Ambulatory Visit: Payer: Medicare Other | Admitting: Urology

## 2016-12-01 DEATH — deceased

## 2017-02-12 IMAGING — MR MR HEAD WO/W CM
10 of 13 series · 33 of 48 positions shown · IV contrast (multihance)
Comparison: None.

CLINICAL DATA: 75-year-old hypertensive male with hyperlipidemia
presenting with weakness and fatigue. Recent diagnosis of lung
cancer. Initial encounter.

EXAM:
MRI HEAD WITHOUT AND WITH CONTRAST
TECHNIQUE: Multiplanar, multiecho pulse sequences of the brain and surrounding
structures were obtained without and with intravenous contrast.
CONTRAST:  7mL MULTIHANCE GADOBENATE DIMEGLUMINE 529 MG/ML IV SOLN

[Series 2: GRE · sagittal · 5.0mm · 0.45mm/px · 2 of 24 slices shown (1 of 2)]
[im 1/24]
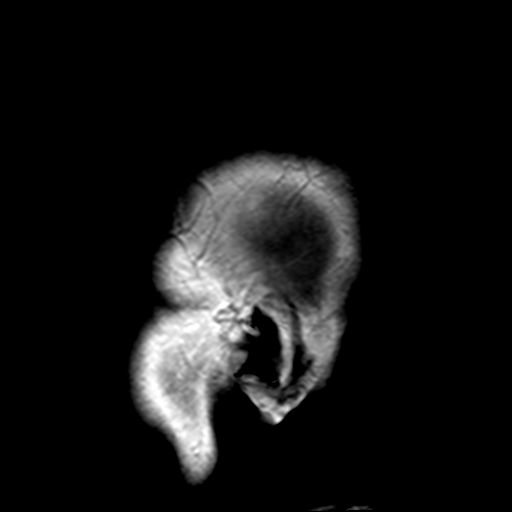
[im 24/24]
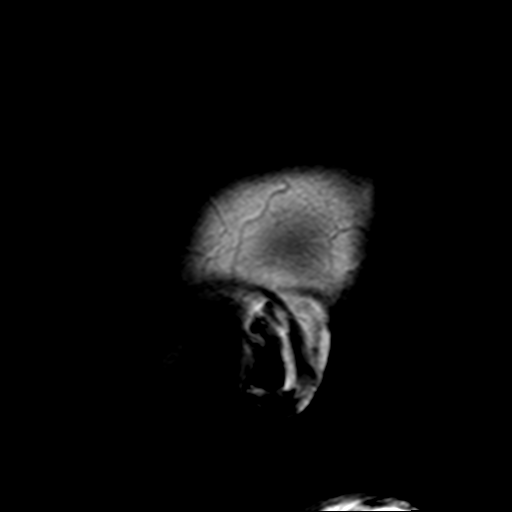

[Series 4: DWI · axial · 3.0mm · 1.80mm/px · z∈[-79,+73]mm · 6 of 48 slices shown (1 of 2)]
[im 1/48]
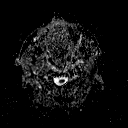
[im 10/48]
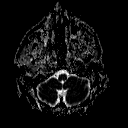
[im 19/48]
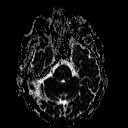
[im 29/48]
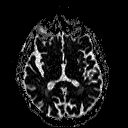
[im 38/48]
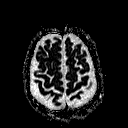
[im 48/48]
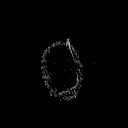

[Series 6: DWI · coronal · 3.0mm · 1.80mm/px · 5 of 45 slices shown (2 of 2)]
[im 1/45]
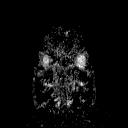
[im 12/45]
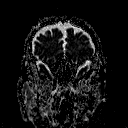
[im 23/45]
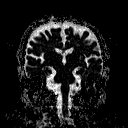
[im 34/45]
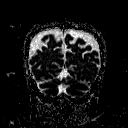
[im 45/45]
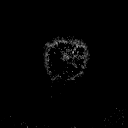

[Series 7: T2 · axial · 5.0mm · 0.45mm/px · z∈[-80,+75]mm · 3 of 25 slices shown (1 of 3)]
[im 1/25]
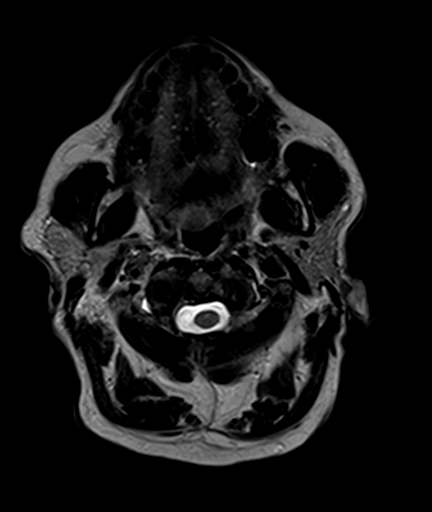
[im 13/25]
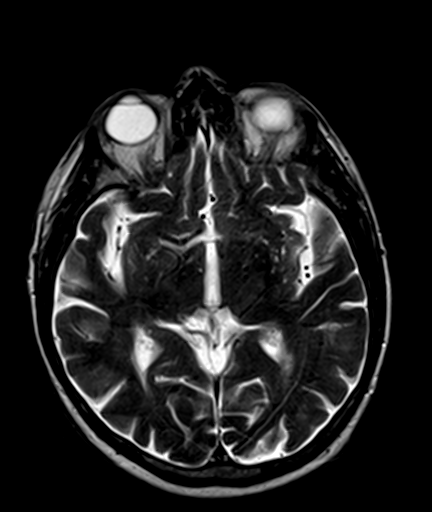
[im 25/25]
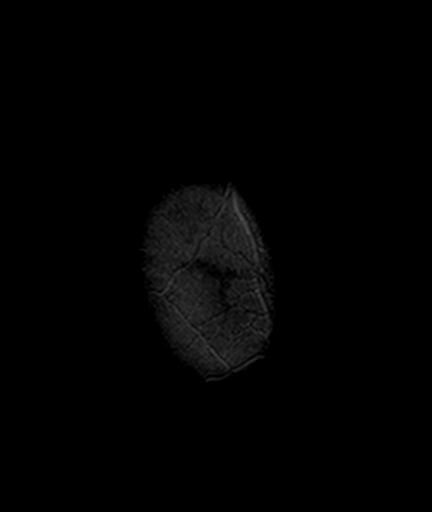

[Series 8: FLAIR · axial · 5.0mm · 0.45mm/px · z∈[-79,+76]mm · 3 of 25 slices shown]
[im 1/25]
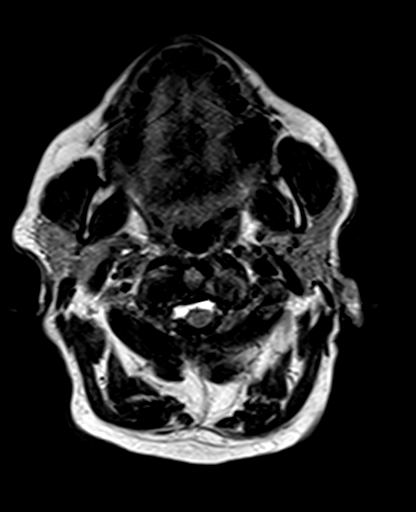
[im 13/25]
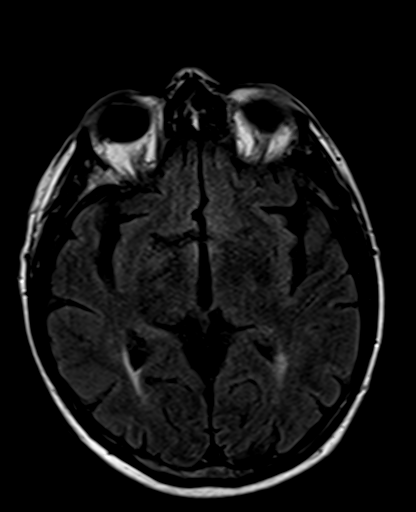
[im 25/25]
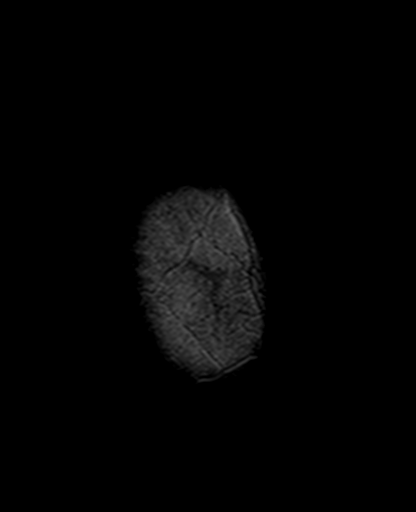

[Series 9: T2 · axial · 5.0mm · 1.20mm/px · z∈[-81,+74]mm · 3 of 25 slices shown (2 of 3)]
[im 1/25]
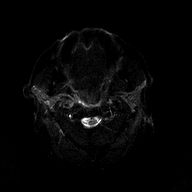
[im 13/25]
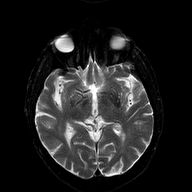
[im 25/25]
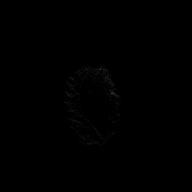

[Series 10: GRE · axial · 5.0mm · 0.45mm/px · z∈[-81,-3]mm · 2 of 25 slices shown (2 of 2)]
[im 1/25]
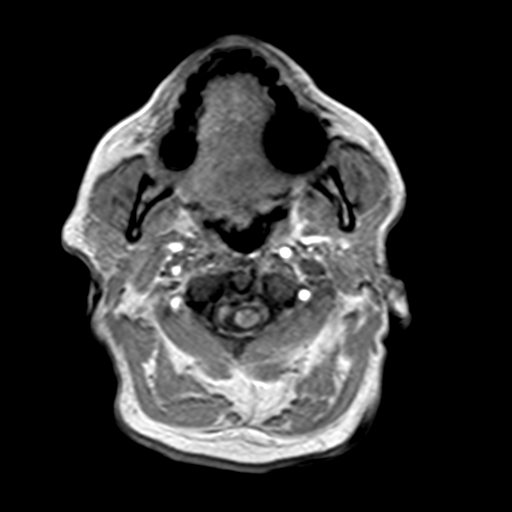
[im 13/25]
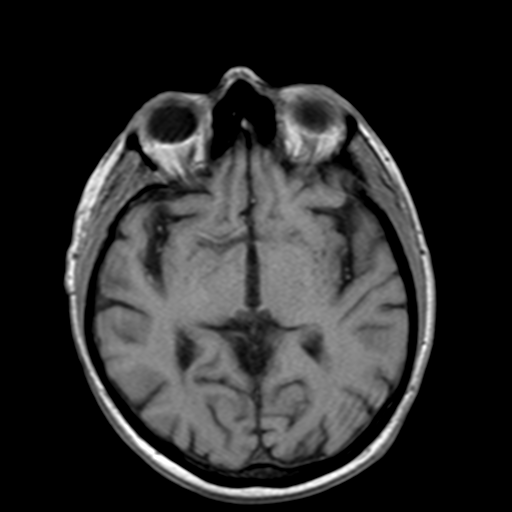

[Series 11: T2 · coronal · 5.0mm · 0.45mm/px · 3 of 24 slices shown (3 of 3)]
[im 1/24]
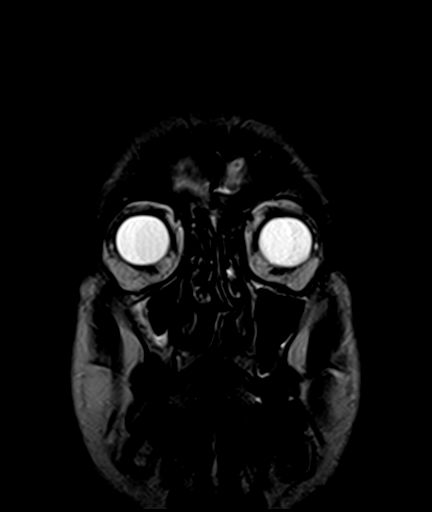
[im 12/24]
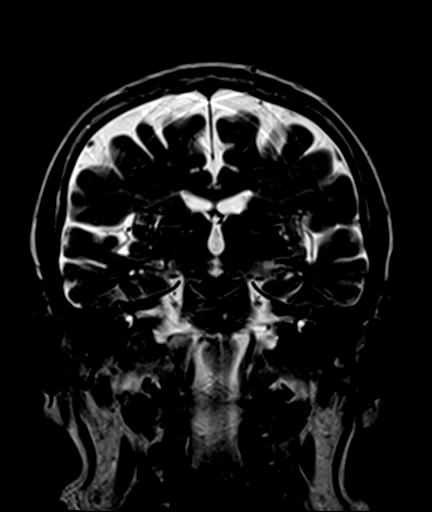
[im 24/24]
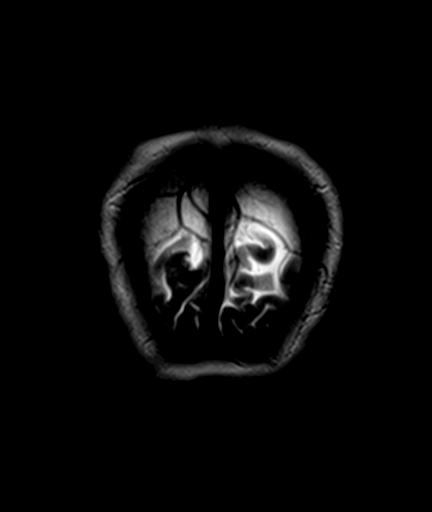

[Series 13: T1 post-contrast · coronal · 5.0mm · 0.43mm/px · 3 of 24 slices shown (1 of 2)]
[im 1/24]
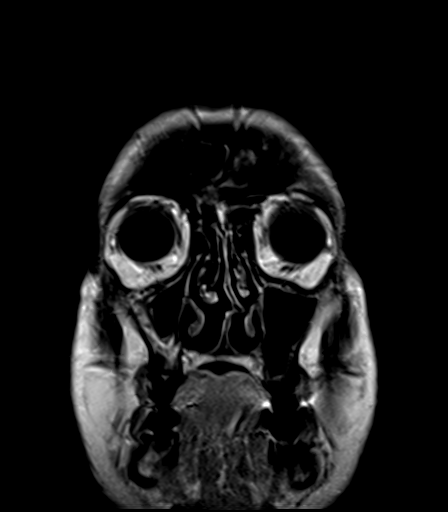
[im 12/24]
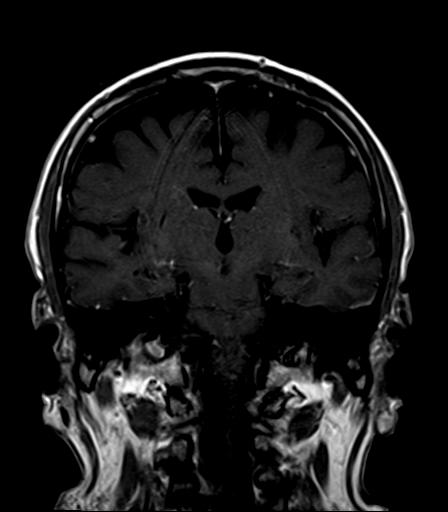
[im 24/24]
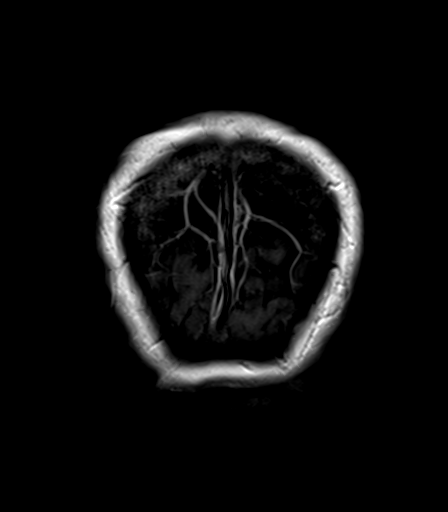

[Series 14: T1 post-contrast · sagittal · 5.0mm · 0.45mm/px · 3 of 24 slices shown (2 of 2)]
[im 1/24]
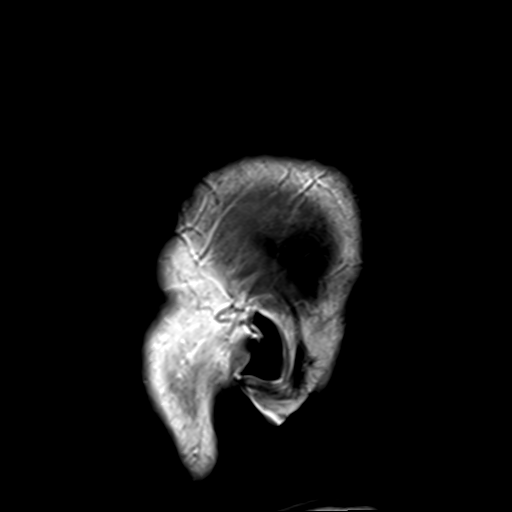
[im 12/24]
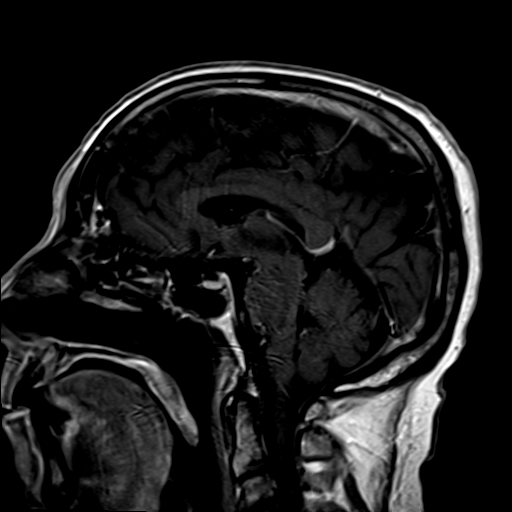
[im 24/24]
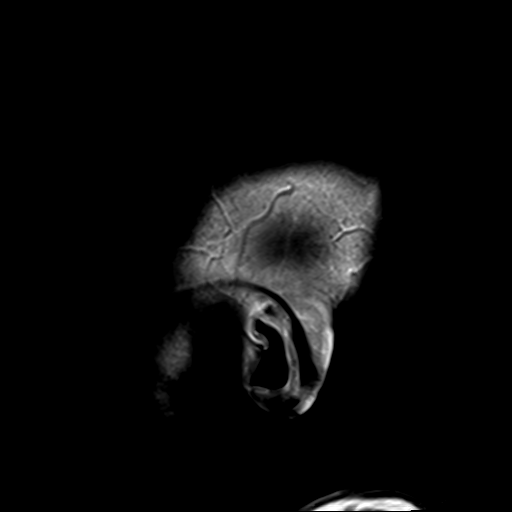

[33 of 48 positions shown; findings below may reference images not displayed]

FINDINGS: Brain: Very small acute nonhemorrhagic right occipital lobe infarct.

No intracranial hemorrhage.

No intracranial enhancing lesion or bony destructive lesion to
suggest presence of intracranial metastatic disease.

Mild to moderate chronic microvascular changes.

Mild moderate global atrophy without hydrocephalus.

Vascular: Major intracranial vascular structures are patent.

Skull and upper cervical spine: C3-4 cervical spondylotic changes
with spinal stenosis and mild moderate cord flattening.

Sinuses/Orbits: Post lens replacement without acute orbital
abnormality.

Air-fluid level left maxillary sinus may indicate acute sinusitis.
Diminutive size right maxillary sinus. Mild mucosal thickening
ethmoid sinus air cells and left frontal sinus.

Other: Possible right upper neck cystic adenopathy. CT of the neck
recommended for further delineation.
IMPRESSION: Very small acute nonhemorrhagic right occipital lobe infarct.

No evidence of intracranial metastatic disease.

Mild to moderate chronic microvascular changes.

Mild to moderate global atrophy without hydrocephalus.

Possible right upper neck cystic adenopathy. CT of the neck
recommended for further delineation.

C3-4 cervical spondylotic changes with spinal stenosis and mild to
moderate cord flattening. This can be further assessed with cervical
spine MR if clinically desired.

Air-fluid level left maxillary sinus may indicate acute sinusitis.
Diminutive size right maxillary sinus. Mild mucosal thickening
ethmoid sinus air cells and left frontal sinus.
